# Patient Record
Sex: Male | Born: 1990 | Race: Black or African American | Hispanic: No | Marital: Single | Smoking: Never smoker
Health system: Southern US, Community
[De-identification: ages and names within clinical notes are randomized; demographics above are authoritative.]

## PROBLEM LIST (undated history)

## (undated) DIAGNOSIS — R109 Unspecified abdominal pain: Secondary | ICD-10-CM

## (undated) DIAGNOSIS — K319 Disease of stomach and duodenum, unspecified: Secondary | ICD-10-CM

## (undated) DIAGNOSIS — K449 Diaphragmatic hernia without obstruction or gangrene: Secondary | ICD-10-CM

## (undated) DIAGNOSIS — G8929 Other chronic pain: Secondary | ICD-10-CM

## (undated) DIAGNOSIS — S83249A Other tear of medial meniscus, current injury, unspecified knee, initial encounter: Secondary | ICD-10-CM

## (undated) DIAGNOSIS — Z86010 Personal history of colonic polyps: Secondary | ICD-10-CM

## (undated) DIAGNOSIS — J45909 Unspecified asthma, uncomplicated: Secondary | ICD-10-CM

## (undated) HISTORY — DX: Other tear of medial meniscus, current injury, unspecified knee, initial encounter: S83.249A

## (undated) HISTORY — DX: Personal history of colonic polyps: Z86.010

---
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
## 2011-01-02 ENCOUNTER — Ambulatory Visit (HOSPITAL_COMMUNITY)
Admission: RE | Admit: 2011-01-02 | Discharge: 2011-01-02 | Disposition: A | Discharge status: Home / Self Care | Payer: PRIVATE HEALTH INSURANCE | Source: Ambulatory Visit | Attending: Family Medicine | Admitting: Family Medicine

## 2011-01-05 ENCOUNTER — Ambulatory Visit (HOSPITAL_COMMUNITY)
Admission: RE | Admit: 2011-01-05 | Discharge: 2011-01-05 | Disposition: A | Discharge status: Home / Self Care | Payer: PRIVATE HEALTH INSURANCE | Source: Ambulatory Visit | Attending: Family Medicine | Admitting: Family Medicine

## 2011-01-07 ENCOUNTER — Ambulatory Visit (HOSPITAL_COMMUNITY)
Admission: RE | Admit: 2011-01-07 | Discharge: 2011-01-07 | Disposition: A | Discharge status: Home / Self Care | Payer: PRIVATE HEALTH INSURANCE | Source: Ambulatory Visit | Attending: Family Medicine | Admitting: Family Medicine

## 2011-01-09 ENCOUNTER — Ambulatory Visit (HOSPITAL_COMMUNITY)
Admission: RE | Admit: 2011-01-09 | Discharge: 2011-01-09 | Disposition: A | Discharge status: Home / Self Care | Payer: PRIVATE HEALTH INSURANCE | Source: Ambulatory Visit | Attending: Family Medicine | Admitting: Family Medicine

## 2011-02-19 ENCOUNTER — Ambulatory Visit (INDEPENDENT_AMBULATORY_CARE_PROVIDER_SITE_OTHER): Payer: PRIVATE HEALTH INSURANCE | Admitting: Orthopedic Surgery

## 2011-02-19 DIAGNOSIS — M23302 Other meniscus derangements, unspecified lateral meniscus, unspecified knee: Secondary | ICD-10-CM

## 2011-02-19 DIAGNOSIS — S83509A Sprain of unspecified cruciate ligament of unspecified knee, initial encounter: Secondary | ICD-10-CM

## 2011-02-19 DIAGNOSIS — IMO0002 Reserved for concepts with insufficient information to code with codable children: Secondary | ICD-10-CM

## 2011-02-19 NOTE — Progress Notes (Signed)
   X-ray report.  AP and lateral LEFT knee.  ACL tunnel sites are noted with bone grafting in proper position.  Impression normal appearance. ACL reconstruction

## 2011-02-19 NOTE — Progress Notes (Signed)
   Followup. status post ACL reconstruction with patellar tendon graft, and partial lateral meniscectomy, repair, medial meniscus.   Therapy has been completed.  Patient having no pain or swelling.  Currently, working out at [employer].  The knee is stable. The x-rays show good position of the graft. He has full range of motion. Continue exercises followup in 3 months

## 2011-05-26 ENCOUNTER — Encounter: Payer: Self-pay | Admitting: Orthopedic Surgery

## 2011-05-26 ENCOUNTER — Ambulatory Visit (INDEPENDENT_AMBULATORY_CARE_PROVIDER_SITE_OTHER): Payer: PRIVATE HEALTH INSURANCE | Admitting: Orthopedic Surgery

## 2011-05-26 DIAGNOSIS — S93401A Sprain of unspecified ligament of right ankle, initial encounter: Secondary | ICD-10-CM | POA: Insufficient documentation

## 2011-05-26 DIAGNOSIS — S83509A Sprain of unspecified cruciate ligament of unspecified knee, initial encounter: Secondary | ICD-10-CM

## 2011-05-26 DIAGNOSIS — IMO0002 Reserved for concepts with insufficient information to code with codable children: Secondary | ICD-10-CM

## 2011-05-26 MED ORDER — IBUPROFEN 800 MG PO TABS: 800.0000 mg | ORAL_TABLET | Freq: Three times a day (TID) | ORAL | Status: DC | PRN

## 2011-05-26 NOTE — Patient Instructions (Signed)
Activities as tolerated. 

## 2011-05-26 NOTE — Progress Notes (Signed)
   Followup visit  6 months after ACL reconstruction LEFT knee  Doing well playing sports no problems other than some aching in his training  Stability tests are all normal  Patient discharged followup as needed activities as tolerated

## 2011-08-29 ENCOUNTER — Encounter (HOSPITAL_COMMUNITY): Payer: Self-pay

## 2011-08-29 ENCOUNTER — Emergency Department (HOSPITAL_COMMUNITY)
Admission: EM | Admit: 2011-08-29 | Discharge: 2011-08-29 | Disposition: A | Discharge status: Home / Self Care | Payer: BC Managed Care – PPO | Attending: Emergency Medicine | Admitting: Emergency Medicine

## 2011-08-29 ENCOUNTER — Emergency Department (HOSPITAL_COMMUNITY): Payer: BC Managed Care – PPO

## 2011-08-29 DIAGNOSIS — M25469 Effusion, unspecified knee: Secondary | ICD-10-CM | POA: Insufficient documentation

## 2011-08-29 DIAGNOSIS — M25462 Effusion, left knee: Secondary | ICD-10-CM

## 2011-08-29 DIAGNOSIS — M25569 Pain in unspecified knee: Secondary | ICD-10-CM | POA: Insufficient documentation

## 2011-08-29 MED ORDER — HYDROCODONE-ACETAMINOPHEN 5-325 MG PO TABS: 1.0000 | ORAL_TABLET | Freq: Four times a day (QID) | ORAL | Status: AC | PRN

## 2011-08-29 MED ORDER — HYDROCODONE-ACETAMINOPHEN 5-325 MG PO TABS
1.0000 | ORAL_TABLET | Freq: Once | ORAL | Status: AC
  Administered 2011-08-29: 1 via ORAL
  Filled 2011-08-29: qty 1

## 2011-08-29 NOTE — ED Provider Notes (Signed)
History     CSN: [id number]  Arrival date & time 08/29/11  2027   First MD Initiated Contact with Patient 08/29/11 2038      Chief Complaint  Patient presents with  . Joint Swelling  . Knee Pain    (Consider location/radiation/quality/duration/timing/severity/associated sxs/prior treatment) HPI Comments: Playing basketball 2 days ago and "came down on it wrong" referring to his L knee.  He had a L ACL and MCL repair 11/2010 by dr. Romeo Apple.  Patient is a 21 y.o. male presenting with knee pain. The history is provided by the patient. No language interpreter was used.  Knee Pain This is a new problem. Episode onset: 2 days ago. The problem occurs constantly. The problem has been unchanged. Associated symptoms include joint swelling. The symptoms are aggravated by bending, twisting, walking and standing. He has tried NSAIDs for the symptoms. The treatment provided no relief.    History reviewed. No pertinent past medical history.  Past Surgical History  Procedure Date  . Knee surgery     No family history on file.  History  Substance Use Topics  . Smoking status: Never Smoker   . Smokeless tobacco: Not on file  . Alcohol Use: No      Review of Systems  Musculoskeletal: Positive for joint swelling.       Knee injury   All other systems reviewed and are negative.    Allergies  Review of patient's allergies indicates no known allergies.  Home Medications   Current Outpatient Rx  Name Route Sig Dispense Refill  . ALLERGY MED PO Oral Take by mouth.      Marland Kitchen HYDROCODONE-ACETAMINOPHEN 5-325 MG PO TABS Oral Take 1 tablet by mouth every 4 (four) hours as needed.      Marland Kitchen HYDROCODONE-ACETAMINOPHEN 7.5-325 MG PO TABS Oral Take 1 tablet by mouth every 6 (six) hours as needed.      . IBUPROFEN 800 MG PO TABS Oral Take 1 tablet (800 mg total) by mouth every 8 (eight) hours as needed. 90 tablet 1    BP 151/80  Pulse 67  Temp(Src) 98.1 F (36.7 C) (Oral)  Resp 20  Ht 5\' 7"   (1.702 m)  Wt 184 lb (83.462 kg)  BMI 28.82 kg/m2  SpO2 99%  Physical Exam  Nursing note and vitals reviewed. Constitutional: He is oriented to person, place, and time. He appears well-developed and well-nourished.  HENT:  Head: Normocephalic and atraumatic.  Eyes: EOM are normal.  Neck: Normal range of motion.  Cardiovascular: Normal rate, regular rhythm, normal heart sounds and intact distal pulses.   Pulmonary/Chest: Effort normal and breath sounds normal. No respiratory distress.  Abdominal: Soft. He exhibits no distension. There is no tenderness.  Musculoskeletal: He exhibits tenderness.       Left knee: He exhibits decreased range of motion, swelling and effusion. He exhibits no ecchymosis, no deformity, no laceration, no erythema, no LCL laxity and no MCL laxity. tenderness found. Medial joint line and MCL tenderness noted. No lateral joint line, no LCL and no patellar tendon tenderness noted.       Legs: Neurological: He is alert and oriented to person, place, and time.  Skin: Skin is warm and dry.  Psychiatric: He has a normal mood and affect. Judgment normal.    ED Course  Procedures (including critical care time)  Labs Reviewed - No data to display No results found.   No diagnosis found.    MDM  pl  Worthy Rancher, [place] 08/29/11 2144

## 2011-08-29 NOTE — ED Notes (Signed)
Pt presents with left knee swelling and pain since Thursday. Pt states he was playing basketball and pain and swelling started.

## 2011-08-29 NOTE — ED Notes (Signed)
Pt thinks he came down on left knee wrong while playing basketball on Thursday, pt had ACE bandage in place PTA, hx left knee surgery due to tear ACL and MCL

## 2011-08-30 NOTE — ED Provider Notes (Signed)
Medical screening examination/treatment/procedure(s) were performed by non-physician practitioner and as supervising physician I was immediately available for consultation/collaboration.  Nicoletta Dress. Colon Branch, MD 08/30/11 1310

## 2011-09-02 ENCOUNTER — Encounter: Payer: Self-pay | Admitting: Orthopedic Surgery

## 2011-09-02 ENCOUNTER — Ambulatory Visit (INDEPENDENT_AMBULATORY_CARE_PROVIDER_SITE_OTHER): Payer: BC Managed Care – PPO | Admitting: Orthopedic Surgery

## 2011-09-02 VITALS — BP 120/70 | Ht 67.0 in | Wt 184.0 lb

## 2011-09-02 DIAGNOSIS — S83209A Unspecified tear of unspecified meniscus, current injury, unspecified knee, initial encounter: Secondary | ICD-10-CM | POA: Insufficient documentation

## 2011-09-02 DIAGNOSIS — IMO0002 Reserved for concepts with insufficient information to code with codable children: Secondary | ICD-10-CM

## 2011-09-02 MED ORDER — HYDROCODONE-ACETAMINOPHEN 5-500 MG PO TABS: 1.0000 | ORAL_TABLET | Freq: Four times a day (QID) | ORAL | Status: DC | PRN

## 2011-09-02 MED ORDER — IBUPROFEN 800 MG PO TABS: 800.0000 mg | ORAL_TABLET | Freq: Three times a day (TID) | ORAL | Status: AC | PRN

## 2011-09-02 MED ORDER — IBUPROFEN 800 MG PO TABS: 800.0000 mg | ORAL_TABLET | Freq: Three times a day (TID) | ORAL | Status: DC | PRN

## 2011-09-02 NOTE — Progress Notes (Signed)
Patient ID: Frank Hansen, male   DOB: [birth year], 21 y.o.   MRN: [id number] Chief Complaint  Patient presents with  . Knee Injury    left knee pain, DOI 08/29/11   Previous ACL reconstruction from a basketball injury less than 2 years ago presents back with reinjury of the LEFT knee first of January of this year.  The patient was coming down after jumping for ball playing basketball and the knee felt funny when he landed on it.  The pain was sudden in onset.  Complains of 6/10 constant throbbing pain and pain when he twists the knee.  He did have swelling almost immediately.  There was no pop.  Review of systems a complete review of systems was normal except for seasonal ALLERGIES  His medical history has been reviewed as above  Physical Exam(12) GENERAL: normal development   CDV: pulses are normal   Skin: normal  Lymph: nodes were not palpable/normal  Psychiatric: awake, alert and oriented  Neuro: normal sensation  MSK He is ambulatory without assistive device 1 The RIGHT knee range of motion is 90 2 The medial joint line is tender, there is slight joint effusion 3 There is a positive McMurray sign 4 The graft feels intact, the PCL is normal the Lachman test is normal 5 .  The collateral ligaments feel stable. 6 Muscle tone is normal.  Assessment: X-rays are negative graft tunnels are noted bone blocks are intact    Plan: Medial meniscal tear, MRI.  No sports, come back 2 weeks

## 2011-09-02 NOTE — Patient Instructions (Signed)
MRI OF KNEE

## 2011-09-07 ENCOUNTER — Telehealth: Payer: Self-pay | Admitting: Radiology

## 2011-09-07 NOTE — Telephone Encounter (Signed)
I called to give the patient his MRI appointment at [facility] on 09-09-11 at 5:45. Patient has [employer], authorization [id number] and it expires on 11-04-11. Patient will follow up back in our office for his results.

## 2011-09-09 ENCOUNTER — Ambulatory Visit (HOSPITAL_COMMUNITY)
Admission: RE | Admit: 2011-09-09 | Discharge: 2011-09-09 | Disposition: A | Discharge status: Home / Self Care | Payer: BC Managed Care – PPO | Source: Ambulatory Visit | Attending: Orthopedic Surgery | Admitting: Orthopedic Surgery

## 2011-09-09 DIAGNOSIS — IMO0002 Reserved for concepts with insufficient information to code with codable children: Secondary | ICD-10-CM | POA: Insufficient documentation

## 2011-09-09 DIAGNOSIS — M23359 Other meniscus derangements, posterior horn of lateral meniscus, unspecified knee: Secondary | ICD-10-CM | POA: Insufficient documentation

## 2011-09-09 DIAGNOSIS — S83209A Unspecified tear of unspecified meniscus, current injury, unspecified knee, initial encounter: Secondary | ICD-10-CM

## 2011-09-09 DIAGNOSIS — Y9367 Activity, basketball: Secondary | ICD-10-CM | POA: Insufficient documentation

## 2011-09-09 DIAGNOSIS — W010XXA Fall on same level from slipping, tripping and stumbling without subsequent striking against object, initial encounter: Secondary | ICD-10-CM | POA: Insufficient documentation

## 2011-09-09 DIAGNOSIS — M25569 Pain in unspecified knee: Secondary | ICD-10-CM | POA: Insufficient documentation

## 2011-09-10 ENCOUNTER — Ambulatory Visit: Payer: PRIVATE HEALTH INSURANCE | Admitting: Orthopedic Surgery

## 2011-09-17 ENCOUNTER — Encounter: Payer: Self-pay | Admitting: Orthopedic Surgery

## 2011-09-17 ENCOUNTER — Ambulatory Visit (INDEPENDENT_AMBULATORY_CARE_PROVIDER_SITE_OTHER): Payer: BC Managed Care – PPO | Admitting: Orthopedic Surgery

## 2011-09-17 VITALS — BP 112/70 | Ht 67.0 in | Wt 184.0 lb

## 2011-09-17 DIAGNOSIS — IMO0002 Reserved for concepts with insufficient information to code with codable children: Secondary | ICD-10-CM

## 2011-09-17 MED ORDER — HYDROCODONE-ACETAMINOPHEN 5-325 MG PO TABS: 1.0000 | ORAL_TABLET | ORAL | Status: DC | PRN

## 2011-09-17 NOTE — Progress Notes (Signed)
Patient ID: Frank Hansen, male   DOB: 1990/09/02, 21 y.o.   MRN: [id number]  Imaging follow-up  Knee injury, previous ACL graft, and medial meniscal repair.    The x-ray report, and the film has been reviewed. My interpretation, of the images: R. that there is a new medial meniscal tear and that the ACL graft is intact.   IMPRESSION:  1. Medial tibial bone contusions/marrow edema.  2. New radial tear and inferior articular surface tear involving  the posterior horn of the medial meniscus.  3. Chronic posterior horn lateral meniscus tear.  4. The ACL graft is abnormal. This may be due to mucoid  degeneration or vascularization. Intact fibers are demonstrated.  5. No joint effusion or Baker's cyst.  6. 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  The patient has had a GI bleed, and was taken off his ibuprofen. He is scheduled to have colonoscopy on 14 February so his surgery. Regarding his knee will have to be postponed.  He can remove his brace. He is told not to play any sports and I will get back to him on or around the 14th after the surgery.

## 2011-09-17 NOTE — Progress Notes (Signed)
Addended by: Vickki Hearing on: 09/17/2011 04:21 PM   Modules accepted: Orders

## 2011-09-17 NOTE — Patient Instructions (Signed)
DR WILL CALL

## 2011-09-25 DIAGNOSIS — Z86010 Personal history of colonic polyps: Secondary | ICD-10-CM

## 2011-09-25 DIAGNOSIS — Z860101 Personal history of adenomatous and serrated colon polyps: Secondary | ICD-10-CM

## 2011-09-25 HISTORY — PX: COLONOSCOPY: SHX5424

## 2011-09-25 HISTORY — DX: Personal history of adenomatous and serrated colon polyps: Z86.0101

## 2011-09-25 HISTORY — DX: Personal history of colonic polyps: Z86.010

## 2011-09-25 HISTORY — PX: ESOPHAGOGASTRODUODENOSCOPY: SHX1529

## 2011-10-08 ENCOUNTER — Ambulatory Visit (INDEPENDENT_AMBULATORY_CARE_PROVIDER_SITE_OTHER): Payer: BC Managed Care – PPO | Admitting: Gastroenterology

## 2011-10-08 ENCOUNTER — Encounter: Payer: Self-pay | Admitting: Gastroenterology

## 2011-10-08 VITALS — BP 133/78 | HR 68 | Temp 98.3°F | Ht 67.0 in | Wt 170.4 lb

## 2011-10-08 DIAGNOSIS — R109 Unspecified abdominal pain: Secondary | ICD-10-CM

## 2011-10-08 DIAGNOSIS — R1013 Epigastric pain: Secondary | ICD-10-CM | POA: Insufficient documentation

## 2011-10-08 DIAGNOSIS — K625 Hemorrhage of anus and rectum: Secondary | ICD-10-CM | POA: Insufficient documentation

## 2011-10-08 MED ORDER — PEG-KCL-NACL-NASULF-NA ASC-C 100 G PO SOLR: 1.0000 | Freq: Once | ORAL | Status: DC

## 2011-10-08 MED ORDER — OMEPRAZOLE 20 MG PO CPDR: 20.0000 mg | DELAYED_RELEASE_CAPSULE | Freq: Two times a day (BID) | ORAL | Status: DC

## 2011-10-08 NOTE — Progress Notes (Signed)
Referring Provider: Laural Golden* Primary Care Physician:  Harlow Asa, MD, MD Primary Gastroenterologist:  Dr. Jena Gauss   Chief Complaint  Patient presents with  . Abdominal Pain    HPI:   Frank Hansen presents today for a consultation secondary to rectal bleeding and possible melena, as well as abdominal pain. He has a history of left knee surgery in the past, and he recently suffered another medial meniscus tear that will require surgery in the near future. He has been seeing Dr. Romeo Apple. Reports chronic Ibuprofen use for about a year, stopped taking Jan 15th at doctor's request due to symptoms. Onset of low-volume hematochezia began after Thanksgiving. Reports normal BMs, no diarrhea or constipation. Does note intermittent tarry/sticky black stools. + epigastric pain, intermittent, not associated with eating/drinking. Intermittent nausea. Lower abdominal discomfort as well, worsened after eating at times. Described as a sharp pain.   No loss of appetite or wt loss. Currently taking Prilosec once daily.   Past Medical History  Diagnosis Date  . Medial meniscus tear     needs surgery, Dr. Romeo Apple to perform in near future.     Past Surgical History  Procedure Date  . Acl graft and medial meniscus repair 12/12/10    left, needs repeat surgery for medial meniscus tear in near future, graft intact    Current Outpatient Prescriptions  Medication Sig Dispense Refill  . HYDROcodone-acetaminophen (VICODIN) 5-500 MG per tablet Take 1 tablet by mouth every 6 (six) hours as needed for pain.  60 tablet  0  . omeprazole (PRILOSEC) 20 MG capsule Take 1 capsule (20 mg total) by mouth 2 (two) times daily.  60 capsule  3  . peg 3350 powder (MOVIPREP) 100 G SOLR Take 1 kit (100 g total) by mouth once. As directed Please purchase 1 Fleets enema to use with the prep  1 kit  0    Allergies as of 10/08/2011  . (No Known Allergies)    Family History  Problem Relation Age of Onset  .  Diabetes    . Colon cancer Neg Hx     History   Social History  . Marital Status: Single    Spouse Name: N/A    Number of Children: N/A  . Years of Education: N/A   Occupational History  . student     [employer], Personnel officer    Social History Main Topics  . Smoking status: Never Smoker   . Smokeless tobacco: Not on file  . Alcohol Use: No  . Drug Use: No  . Sexually Active: Not on file   Other Topics Concern  . Not on file   Social History Narrative  . No narrative on file    Review of Systems: Gen: Denies any fever, chills, loss of appetite, fatigue, weight loss. CV: Denies chest pain, heart palpitations, syncope, peripheral edema. Resp: Denies shortness of breath with rest, cough, wheezing GI: Denies dysphagia or odynophagia. Denies hematemesis, fecal incontinence, or jaundice.  GU : Denies urinary burning, urinary frequency, urinary incontinence.  MS: Denies joint pain, muscle weakness, cramps, limited movement Derm: Denies rash, itching, dry skin Psych: Denies depression, anxiety, confusion or memory loss  Heme: Denies bruising, bleeding, and enlarged lymph nodes.  Physical Exam: BP 133/78  Pulse 68  Temp(Src) 98.3 F (36.8 C) (Temporal)  Ht 5\' 7"  (1.702 m)  Wt 170 lb 6.4 oz (77.293 kg)  BMI 26.69 kg/m2 General:   Alert and oriented. Well-developed, well-nourished, pleasant and cooperative. Head:  Normocephalic and  atraumatic. Eyes:  Conjunctiva pink, sclera clear, no icterus.   Conjunctiva pink. Ears:  Normal auditory acuity. Nose:  No deformity, discharge,  or lesions. Mouth:  No deformity or lesions, mucosa pink and moist.  Neck:  Supple, without mass or thyromegaly. Lungs:  Clear to auscultation bilaterally, without wheezing, rales, or rhonchi.  Heart:  S1, S2 present without murmurs noted.  Abdomen:  +BS, soft, mildly TTP epigastric region and non-distended. Without mass or HSM. No rebound or guarding. No hernias noted. Rectal:   Deferred  Msk:  Symmetrical without gross deformities. Normal posture. Pulses:  Normal pulses noted. Extremities:  Without clubbing or edema. Neurologic:  Alert and  oriented x4;  grossly normal neurologically. Skin:  Intact, warm and dry without significant lesions or rashes Cervical Nodes:  No significant cervical adenopathy. Psych:  Alert and cooperative. Normal mood and affect.

## 2011-10-08 NOTE — Patient Instructions (Signed)
We have set you up for a colonoscopy and upper endoscopy with Dr. Jena Gauss in the near future.   Please have the blood work completed and we will call you with the results.  Increase Prilosec to twice a day, 30 min before breakfast and 30 min before dinner.

## 2011-10-08 NOTE — Assessment & Plan Note (Signed)
21 year old male with intermittent hematochezia since Thanksgiving, in the setting of chronic Ibuprofen use multiple times per day. Needs knee surgery in upcoming future due to medial meniscus tear. No diarrhea, constipation noted. Does report lower abdominal discomfort, unrelated to BMs, sometimes worsened after eating. Question NSAID-induced injury, low likelihood IBD but unable to rule out. Will proceed with TCS in near future.  Proceed with TCS with Dr. Jena Gauss in near future: the risks, benefits, and alternatives have been discussed with the patient in detail. The patient states understanding and desires to proceed. Continue to avoid NSAIDs

## 2011-10-08 NOTE — Assessment & Plan Note (Signed)
Epigastric pain since Thanksgiving in the setting of chronic NSAIDs, reports of intermittent black/tarry stool. Intermittent nausea. On Prilosec daily. Likely NSAID-induced injury. Will proceed with EGD at time of TCS. Obtain CBC.   Increase Prilosec to BID CBC Proceed with upper endoscopy in the near future with Dr. Jena Gauss. The risks, benefits, and alternatives have been discussed in detail with patient. They have stated understanding and desire to proceed.

## 2011-10-09 LAB — CBC WITH DIFFERENTIAL/PLATELET
Hemoglobin: 14.7 g/dL (ref 13.0–17.0)
Lymphs Abs: 2.3 10*3/uL (ref 0.7–4.0)
MCH: 31 pg (ref 26.0–34.0)
Monocytes Relative: 10 % (ref 3–12)
Neutro Abs: 2.8 10*3/uL (ref 1.7–7.7)
Neutrophils Relative %: 43 % (ref 43–77)
RBC: 4.74 MIL/uL (ref 4.22–5.81)

## 2011-10-12 NOTE — Progress Notes (Signed)
Quick Note:  No anemia. Continue with EGD/TCS as planned. ______

## 2011-10-12 NOTE — Progress Notes (Signed)
Faxed to PCP

## 2011-10-13 NOTE — Progress Notes (Signed)
Quick Note:  Tried to call pt- NA ______ 

## 2011-10-14 NOTE — Progress Notes (Signed)
Quick Note:  Tried to call pt NA- letter mailed to pt ______

## 2011-10-15 ENCOUNTER — Emergency Department (HOSPITAL_COMMUNITY)
Admission: EM | Admit: 2011-10-15 | Discharge: 2011-10-15 | Disposition: A | Discharge status: Home / Self Care | Payer: BC Managed Care – PPO | Attending: Emergency Medicine | Admitting: Emergency Medicine

## 2011-10-15 ENCOUNTER — Encounter (HOSPITAL_COMMUNITY): Payer: Self-pay | Admitting: *Deleted

## 2011-10-15 ENCOUNTER — Emergency Department (HOSPITAL_COMMUNITY): Payer: BC Managed Care – PPO

## 2011-10-15 DIAGNOSIS — Z79899 Other long term (current) drug therapy: Secondary | ICD-10-CM | POA: Insufficient documentation

## 2011-10-15 DIAGNOSIS — R110 Nausea: Secondary | ICD-10-CM | POA: Insufficient documentation

## 2011-10-15 DIAGNOSIS — R1013 Epigastric pain: Secondary | ICD-10-CM | POA: Insufficient documentation

## 2011-10-15 DIAGNOSIS — R630 Anorexia: Secondary | ICD-10-CM | POA: Insufficient documentation

## 2011-10-15 DIAGNOSIS — R10816 Epigastric abdominal tenderness: Secondary | ICD-10-CM | POA: Insufficient documentation

## 2011-10-15 HISTORY — DX: Disease of stomach and duodenum, unspecified: K31.9

## 2011-10-15 LAB — CBC
HCT: 42.1 % (ref 39.0–52.0)
Hemoglobin: 14.7 g/dL (ref 13.0–17.0)
MCV: 90.5 fL (ref 78.0–100.0)
Platelets: 272 10*3/uL (ref 150–400)
RBC: 4.65 MIL/uL (ref 4.22–5.81)
WBC: 9.1 10*3/uL (ref 4.0–10.5)

## 2011-10-15 LAB — DIFFERENTIAL
Eosinophils Relative: 2 % (ref 0–5)
Lymphocytes Relative: 24 % (ref 12–46)
Lymphs Abs: 2.2 10*3/uL (ref 0.7–4.0)
Monocytes Absolute: 0.6 10*3/uL (ref 0.1–1.0)
Monocytes Relative: 7 % (ref 3–12)

## 2011-10-15 LAB — COMPREHENSIVE METABOLIC PANEL
ALT: 16 U/L (ref 0–53)
BUN: 18 mg/dL (ref 6–23)
CO2: 28 mEq/L (ref 19–32)
Calcium: 10.3 mg/dL (ref 8.4–10.5)
GFR calc Af Amer: 90 mL/min — ABNORMAL LOW (ref >90)
GFR calc non Af Amer: 77 mL/min — ABNORMAL LOW (ref >90)
Glucose, Bld: 98 mg/dL (ref 70–99)
Sodium: 138 mEq/L (ref 135–145)

## 2011-10-15 MED ORDER — GI COCKTAIL ~~LOC~~
30.0000 mL | Freq: Once | ORAL | Status: AC
  Administered 2011-10-15: 30 mL via ORAL
  Filled 2011-10-15: qty 30

## 2011-10-15 MED ORDER — GI COCKTAIL ~~LOC~~: 30.0000 mL | Freq: Once | ORAL | Status: DC

## 2011-10-15 NOTE — ED Provider Notes (Signed)
Pt improved with tx and will get upper gi scope next week.  Frank Lennert, MD 10/15/11 2325

## 2011-10-15 NOTE — ED Notes (Signed)
abd pain since November, Thinks is due to Motrin he took, To have endo and colonoscopy on Monday.

## 2011-10-15 NOTE — Discharge Instructions (Signed)
Follow up for the upper gi as scheduled

## 2011-10-15 NOTE — ED Provider Notes (Cosign Needed)
History     CSN: [id number]  Arrival date & time 10/15/11  2039   First MD Initiated Contact with Patient 10/15/11 2138      Chief Complaint  Patient presents with  . Abdominal Pain    (Consider location/radiation/quality/duration/timing/severity/associated sxs/prior treatment) Patient is a 21 y.o. male presenting with abdominal pain. The history is provided by the patient and medical records. No language interpreter was used.  Abdominal Pain The primary symptoms of the illness include abdominal pain and nausea. The primary symptoms of the illness do not include fever. Primary symptoms comment: Patient complains of abdominal pain in the epigastric region. He's had this pain on and off since November 2012. Today it seems a bit worse, and he therefore sought evaluation. He has had gastroenterology consultation. Episode onset: He has been prescribed omeprazole for hyperacidity and hydrocodone-acetaminophen for pain.  He is scheduled for upper endoscopy in 4 days. The onset of the illness was gradual. The problem has been gradually worsening.  The illness is associated with NSAID use. The patient has not had a change in bowel habit. Additional symptoms associated with the illness include anorexia. Symptoms associated with the illness do not include chills. Associated medical issues comments: Concern for peptic ulcer disease.Marland Kitchen    Past Medical History  Diagnosis Date  . Medial meniscus tear     needs surgery, Dr. Romeo Apple to perform in near future.   . Stomach problems     Past Surgical History  Procedure Date  . Acl graft and medial meniscus repair 12/12/10    left, needs repeat surgery for medial meniscus tear in near future, graft intact    Family History  Problem Relation Age of Onset  . Diabetes    . Colon cancer Neg Hx     History  Substance Use Topics  . Smoking status: Never Smoker   . Smokeless tobacco: Not on file  . Alcohol Use: No      Review of Systems    Constitutional: Negative.  Negative for fever and chills.  HENT: Negative.   Eyes: Negative.   Respiratory: Negative.   Cardiovascular: Negative.   Gastrointestinal: Positive for nausea, abdominal pain and anorexia.  Genitourinary: Negative.   Musculoskeletal:       Has known L ACL tear.  Is waiting to have this repaired once his GI condition is better.  Skin: Negative.   Neurological: Negative.   Psychiatric/Behavioral: Negative.     Allergies  Review of patient's allergies indicates no known allergies.  Home Medications   Current Outpatient Rx  Name Route Sig Dispense Refill  . HYDROCODONE-ACETAMINOPHEN 5-500 MG PO TABS Oral Take 1 tablet by mouth every 6 (six) hours as needed for pain. 60 tablet 0  . OMEPRAZOLE 20 MG PO CPDR Oral Take 1 capsule (20 mg total) by mouth 2 (two) times daily. 60 capsule 3    BP 121/69  Pulse 93  Temp(Src) 99.3 F (37.4 C) (Oral)  Resp 20  Ht 5\' 7"  (1.702 m)  Wt 170 lb (77.111 kg)  BMI 26.63 kg/m2  SpO2 99%  Physical Exam  Nursing note and vitals reviewed. Constitutional: He appears well-developed and well-nourished. No distress.  HENT:  Head: Normocephalic and atraumatic.  Right Ear: External ear normal.  Left Ear: External ear normal.  Mouth/Throat: Oropharynx is clear and moist.  Eyes: Conjunctivae and EOM are normal. Pupils are equal, round, and reactive to light. No scleral icterus.  Neck: Normal range of motion. Neck supple.  Cardiovascular: Normal  rate, regular rhythm and normal heart sounds.   Pulmonary/Chest: Effort normal and breath sounds normal.  Abdominal: Soft. There is Tenderness: he has mild epigastric tenderness without rebound or rigidity..  Musculoskeletal: Normal range of motion.       He has a well-healed scar on the left knee from a prior ACL repair.   Skin: Skin is warm and dry.  Psychiatric: He has a normal mood and affect. His behavior is normal.    ED Course  Procedures (including critical care  time)   Labs Reviewed  CBC  DIFFERENTIAL  COMPREHENSIVE METABOLIC PANEL  LIPASE, BLOOD  URINALYSIS, ROUTINE W REFLEX MICROSCOPIC   9:38 PM Pt seen --> physical exam performed.  Lab workup ordered.  GI cocktail po ordered.  Old charts reviewed, shows pt has had symptoms of gastritis since November 2012, felt to be due to high doses of NSAIDs.  He is scheduled for upper endoscopy in 4 days.  He is on omeprazole for his gastritis and hydrocodone-acetaminophen as needed for pain.   Results for orders placed during the hospital encounter of 10/15/11  CBC      Component Value Range   WBC 9.1  4.0 - 10.5 (K/uL)   RBC 4.65  4.22 - 5.81 (MIL/uL)   Hemoglobin 14.7  13.0 - 17.0 (g/dL)   HCT 16.1  09.6 - 04.5 (%)   MCV 90.5  78.0 - 100.0 (fL)   MCH 31.6  26.0 - 34.0 (pg)   MCHC 34.9  30.0 - 36.0 (g/dL)   RDW 40.9  81.1 - 91.4 (%)   Platelets 272  150 - 400 (K/uL)  DIFFERENTIAL      Component Value Range   Neutrophils Relative 67  43 - 77 (%)   Neutro Abs 6.2  1.7 - 7.7 (K/uL)   Lymphocytes Relative 24  12 - 46 (%)   Lymphs Abs 2.2  0.7 - 4.0 (K/uL)   Monocytes Relative 7  3 - 12 (%)   Monocytes Absolute 0.6  0.1 - 1.0 (K/uL)   Eosinophils Relative 2  0 - 5 (%)   Eosinophils Absolute 0.1  0.0 - 0.7 (K/uL)   Basophils Relative 0  0 - 1 (%)   Basophils Absolute 0.0  0.0 - 0.1 (K/uL)  COMPREHENSIVE METABOLIC PANEL      Component Value Range   Sodium 138  135 - 145 (mEq/L)   Potassium 4.3  3.5 - 5.1 (mEq/L)   Chloride 100  96 - 112 (mEq/L)   CO2 28  19 - 32 (mEq/L)   Glucose, Bld 98  70 - 99 (mg/dL)   BUN 18  6 - 23 (mg/dL)   Creatinine, Ser 7.82  0.50 - 1.35 (mg/dL)   Calcium 95.6  8.4 - 10.5 (mg/dL)   Total Protein 8.6 (*) 6.0 - 8.3 (g/dL)   Albumin 4.7  3.5 - 5.2 (g/dL)   AST 22  0 - 37 (U/L)   ALT 16  0 - 53 (U/L)   Alkaline Phosphatase 98  39 - 117 (U/L)   Total Bilirubin 0.4  0.3 - 1.2 (mg/dL)   GFR calc non Af Amer 77 (*) >90 (mL/min)   GFR calc Af Amer 90 (*) >90 (mL/min)   LIPASE, BLOOD      Component Value Range   Lipase 47  11 - 59 (U/L)   Dg Abd Acute W/chest  10/15/2011  *RADIOLOGY REPORT*  Clinical Data: Worsening abdominal pain; history of gastritis due to NSAIDs.  ACUTE  ABDOMEN SERIES (ABDOMEN 2 VIEW & CHEST 1 VIEW)  Comparison: None.  Findings: The lungs are well-aerated and clear.  There is no evidence of focal opacification, pleural effusion or pneumothorax. The cardiomediastinal silhouette is within normal limits.  The visualized bowel gas pattern is unremarkable. Stool and air are noted throughout the colon; there is no evidence of small bowel dilatation to suggest obstruction.  A trace amount of air is noted within the stomach.  No free intra-abdominal air is identified on the provided upright view.  No acute osseous abnormalities are seen; the sacroiliac joints are unremarkable in appearance.  IMPRESSION:  1.  Unremarkable bowel gas pattern; no free intra-abdominal air seen. 2.  No acute cardiopulmonary process identified.  Original Report Authenticated By: Tonia Ghent, M.D.   Pt's lab workup was negative. He was released with advice to have upper endoscopy as scheduled.   1. Abdominal pain          Carleene Cooper III, MD 10/16/11 [phone]

## 2011-10-19 ENCOUNTER — Encounter (HOSPITAL_COMMUNITY): Payer: Self-pay

## 2011-10-19 ENCOUNTER — Encounter (HOSPITAL_COMMUNITY)
Admission: RE | Disposition: A | Discharge status: Home Health | Payer: Self-pay | Source: Ambulatory Visit | Attending: Internal Medicine

## 2011-10-19 ENCOUNTER — Ambulatory Visit (HOSPITAL_COMMUNITY)
Admission: RE | Admit: 2011-10-19 | Discharge: 2011-10-19 | Disposition: A | Discharge status: Home Health | Payer: BC Managed Care – PPO | Source: Ambulatory Visit | Attending: Internal Medicine | Admitting: Internal Medicine

## 2011-10-19 DIAGNOSIS — D128 Benign neoplasm of rectum: Secondary | ICD-10-CM | POA: Insufficient documentation

## 2011-10-19 DIAGNOSIS — K621 Rectal polyp: Secondary | ICD-10-CM

## 2011-10-19 DIAGNOSIS — R109 Unspecified abdominal pain: Secondary | ICD-10-CM | POA: Insufficient documentation

## 2011-10-19 DIAGNOSIS — K620 Anal polyp: Secondary | ICD-10-CM

## 2011-10-19 DIAGNOSIS — K449 Diaphragmatic hernia without obstruction or gangrene: Secondary | ICD-10-CM | POA: Insufficient documentation

## 2011-10-19 DIAGNOSIS — K921 Melena: Secondary | ICD-10-CM

## 2011-10-19 SURGERY — COLONOSCOPY WITH ESOPHAGOGASTRODUODENOSCOPY (EGD)
Anesthesia: Moderate Sedation

## 2011-10-19 MED ORDER — SODIUM CHLORIDE 0.45 % IV SOLN
INTRAVENOUS | Status: DC
  Administered 2011-10-19: 13:00:00 via INTRAVENOUS

## 2011-10-19 MED ORDER — MEPERIDINE HCL 100 MG/ML IJ SOLN
INTRAMUSCULAR | Status: AC
  Filled 2011-10-19: qty 2

## 2011-10-19 MED ORDER — MEPERIDINE HCL 100 MG/ML IJ SOLN
INTRAMUSCULAR | Status: DC | PRN
  Administered 2011-10-19: 25 mg via INTRAVENOUS
  Administered 2011-10-19: 50 mg via INTRAVENOUS
  Administered 2011-10-19: 25 mg via INTRAVENOUS

## 2011-10-19 MED ORDER — BUTAMBEN-TETRACAINE-BENZOCAINE 2-2-14 % EX AERO
INHALATION_SPRAY | CUTANEOUS | Status: DC | PRN
  Administered 2011-10-19: 2 via TOPICAL

## 2011-10-19 MED ORDER — MIDAZOLAM HCL 5 MG/5ML IJ SOLN
INTRAMUSCULAR | Status: AC
  Filled 2011-10-19: qty 10

## 2011-10-19 MED ORDER — MIDAZOLAM HCL 5 MG/5ML IJ SOLN
INTRAMUSCULAR | Status: DC | PRN
  Administered 2011-10-19: 2 mg via INTRAVENOUS
  Administered 2011-10-19 (×3): 1 mg via INTRAVENOUS
  Administered 2011-10-19: 2 mg via INTRAVENOUS
  Administered 2011-10-19: 1 mg via INTRAVENOUS

## 2011-10-19 MED ORDER — STERILE WATER FOR IRRIGATION IR SOLN
Status: DC | PRN
  Administered 2011-10-19: 13:00:00

## 2011-10-19 NOTE — Discharge Instructions (Signed)
Colonoscopy Discharge Instructions  Read the instructions outlined below and refer to this sheet in the next few weeks. These discharge instructions provide you with general information on caring for yourself after you leave the hospital. Your doctor may also give you specific instructions. While your treatment has been planned according to the most current medical practices available, unavoidable complications occasionally occur. If you have any problems or questions after discharge, call Dr. Gala Romney at [phone]. ACTIVITY  You may resume your regular activity, but move at a slower pace for the next 24 hours.   Take frequent rest periods for the next 24 hours.   Walking will help get rid of the air and reduce the bloated feeling in your belly (abdomen).   No driving for 24 hours (because of the medicine (anesthesia) used during the test).    Do not sign any important legal documents or operate any machinery for 24 hours (because of the anesthesia used during the test).  NUTRITION  Drink plenty of fluids.   You may resume your normal diet as instructed by your doctor.   Begin with a light meal and progress to your normal diet. Heavy or fried foods are harder to digest and may make you feel sick to your stomach (nauseated).   Avoid alcoholic beverages for 24 hours or as instructed.  MEDICATIONS  You may resume your normal medications unless your doctor tells you otherwise.  WHAT YOU CAN EXPECT TODAY  Some feelings of bloating in the abdomen.   Passage of more gas than usual.   Spotting of blood in your stool or on the toilet paper.  IF YOU HAD POLYPS REMOVED DURING THE COLONOSCOPY:  No aspirin products for 7 days or as instructed.   No alcohol for 7 days or as instructed.   Eat a soft diet for the next 24 hours.  FINDING OUT THE RESULTS OF YOUR TEST Not all test results are available during your visit. If your test results are not back during the visit, make an appointment  with your caregiver to find out the results. Do not assume everything is normal if you have not heard from your caregiver or the medical facility. It is important for you to follow up on all of your test results.  SEEK IMMEDIATE MEDICAL ATTENTION IF:  You have more than a spotting of blood in your stool.   Your belly is swollen (abdominal distention).   You are nauseated or vomiting.   You have a temperature over 101.  You have abdominal pain or discomfort that is severe or gets worse throughout the day. EGD Discharge instructions Please read the instructions outlined below and refer to this sheet in the next few weeks. These discharge instructions provide you with general information on caring for yourself after you leave the hospital. Your doctor may also give you specific instructions. While your treatment has been planned according to the most current medical practices available, unavoidable complications occasionally occur. If you have any problems or questions after discharge, please call your doctor. ACTIVITY You may resume your regular activity but move at a slower pace for the next 24 hours.  Take frequent rest periods for the next 24 hours.  Walking will help expel (get rid of) the air and reduce the bloated feeling in your abdomen.  No driving for 24 hours (because of the anesthesia (medicine) used during the test).  You may shower.  Do not sign any important legal documents or operate any machinery for 24  hours (because of the anesthesia used during the test).  NUTRITION Drink plenty of fluids.  You may resume your normal diet.  Begin with a light meal and progress to your normal diet.  Avoid alcoholic beverages for 24 hours or as instructed by your caregiver.  MEDICATIONS You may resume your normal medications unless your caregiver tells you otherwise.  WHAT YOU CAN EXPECT TODAY You may experience abdominal discomfort such as a feeling of fullness or "gas" pains.   FOLLOW-UP Your doctor will discuss the results of your test with you.  SEEK IMMEDIATE MEDICAL ATTENTION IF ANY OF THE FOLLOWING OCCUR: Excessive nausea (feeling sick to your stomach) and/or vomiting.  Severe abdominal pain and distention (swelling).  Trouble swallowing.  Temperature over 101 F (37.8 C).  Rectal bleeding or vomiting of blood.    Stop Prilosec; begin AcipHex 20 mg daily.  Avoid nonsteroidal agents in the future.  Further recommendations to follow pending review of pathology report.

## 2011-10-19 NOTE — H&P (View-Only) (Signed)
Referring Provider: Luking, William Stephen* Primary Care Physician:  LUKING,W S, MD, MD Primary Gastroenterologist:  Dr. Rourk   Chief Complaint  Patient presents with  . Abdominal Pain    HPI:   Frank Hansen presents today for a consultation secondary to rectal bleeding and possible melena, as well as abdominal pain. He has a history of left knee surgery in the past, and he recently suffered another medial meniscus tear that will require surgery in the near future. He has been seeing Dr. Harrison. Reports chronic Ibuprofen use for about a year, stopped taking Jan 15th at doctor's request due to symptoms. Onset of low-volume hematochezia began after Thanksgiving. Reports normal BMs, no diarrhea or constipation. Does note intermittent tarry/sticky black stools. + epigastric pain, intermittent, not associated with eating/drinking. Intermittent nausea. Lower abdominal discomfort as well, worsened after eating at times. Described as a sharp pain.   No loss of appetite or wt loss. Currently taking Prilosec once daily.   Past Medical History  Diagnosis Date  . Medial meniscus tear     needs surgery, Dr. Harrison to perform in near future.     Past Surgical History  Procedure Date  . Acl graft and medial meniscus repair 12/12/10    left, needs repeat surgery for medial meniscus tear in near future, graft intact    Current Outpatient Prescriptions  Medication Sig Dispense Refill  . HYDROcodone-acetaminophen (VICODIN) 5-500 MG per tablet Take 1 tablet by mouth every 6 (six) hours as needed for pain.  60 tablet  0  . omeprazole (PRILOSEC) 20 MG capsule Take 1 capsule (20 mg total) by mouth 2 (two) times daily.  60 capsule  3  . peg 3350 powder (MOVIPREP) 100 G SOLR Take 1 kit (100 g total) by mouth once. As directed Please purchase 1 Fleets enema to use with the prep  1 kit  0    Allergies as of 10/08/2011  . (No Known Allergies)    Family History  Problem Relation Age of Onset  .  Diabetes    . Colon cancer Neg Hx     History   Social History  . Marital Status: Single    Spouse Name: N/A    Number of Children: N/A  . Years of Education: N/A   Occupational History  . student     [employer], Electrician    Social History Main Topics  . Smoking status: Never Smoker   . Smokeless tobacco: Not on file  . Alcohol Use: No  . Drug Use: No  . Sexually Active: Not on file   Other Topics Concern  . Not on file   Social History Narrative  . No narrative on file    Review of Systems: Gen: Denies any fever, chills, loss of appetite, fatigue, weight loss. CV: Denies chest pain, heart palpitations, syncope, peripheral edema. Resp: Denies shortness of breath with rest, cough, wheezing GI: Denies dysphagia or odynophagia. Denies hematemesis, fecal incontinence, or jaundice.  GU : Denies urinary burning, urinary frequency, urinary incontinence.  MS: Denies joint pain, muscle weakness, cramps, limited movement Derm: Denies rash, itching, dry skin Psych: Denies depression, anxiety, confusion or memory loss  Heme: Denies bruising, bleeding, and enlarged lymph nodes.  Physical Exam: BP 133/78  Pulse 68  Temp(Src) 98.3 F (36.8 C) (Temporal)  Ht 5' 7" (1.702 m)  Wt 170 lb 6.4 oz (77.293 kg)  BMI 26.69 kg/m2 General:   Alert and oriented. Well-developed, well-nourished, pleasant and cooperative. Head:  Normocephalic and   atraumatic. Eyes:  Conjunctiva pink, sclera clear, no icterus.   Conjunctiva pink. Ears:  Normal auditory acuity. Nose:  No deformity, discharge,  or lesions. Mouth:  No deformity or lesions, mucosa pink and moist.  Neck:  Supple, without mass or thyromegaly. Lungs:  Clear to auscultation bilaterally, without wheezing, rales, or rhonchi.  Heart:  S1, S2 present without murmurs noted.  Abdomen:  +BS, soft, mildly TTP epigastric region and non-distended. Without mass or HSM. No rebound or guarding. No hernias noted. Rectal:   Deferred  Msk:  Symmetrical without gross deformities. Normal posture. Pulses:  Normal pulses noted. Extremities:  Without clubbing or edema. Neurologic:  Alert and  oriented x4;  grossly normal neurologically. Skin:  Intact, warm and dry without significant lesions or rashes Cervical Nodes:  No significant cervical adenopathy. Psych:  Alert and cooperative. Normal mood and affect.   

## 2011-10-19 NOTE — Op Note (Signed)
[facility] [address] [place], [place]  [address]  ENDOSCOPY PROCEDURE REPORT  PATIENT:  Frank Hansen, Frank Hansen  MR#:  [id number] BIRTHDATE:  1990/10/27, 21 yrs. old  GENDER:  male  ENDOSCOPIST:  R. Roetta Sessions, MD Caleen Essex Referred by:  Simone Curia, M.D.  PROCEDURE DATE:  10/19/2011 PROCEDURE:   Diagnostic EGD  INDICATIONS:   melena and hematochezia; recent NSAID use. However, CBC normal  INFORMED CONSENT:   The risks, benefits, limitations, alternatives and imponderables have been discussed.  The potential for biopsy, esophogeal dilation, etc. have also been reviewed.  Questions have been answered.  All parties agreeable.  Please see the history and physical in the medical record for more information.  MEDICATIONS: Versed 5 mg IV and Demerol 100 mg IV in divided doses. Cetacaine spray  DESCRIPTION OF PROCEDURE:   The [id number] ([id number]) endoscope was introduced through the mouth and advanced to the second portion of the duodenum without difficulty or limitations.  The mucosal surfaces were surveyed very carefully during advancement of the scope and upon withdrawal.  Retroflexion view of the proximal stomach and esophagogastric junction was performed.  <<PROCEDUREIMAGES>>  FINDINGS:  Normal esophagus. Stomach empty. Small hiatal hernia; otherwise, normal gastric mucosa. Patent pylorus. Normal first and second portion of the duodenum. THERAPEUTIC / DIAGNOSTIC MANEUVERS PERFORMED:   None  COMPLICATIONS:   None  IMPRESSION:    Small hiatal hernia otherwise negative exam  RECOMMENDATIONS:   See colonoscopy report.  ______________________________ R. Roetta Sessions, MD Caleen Essex  CC:  n. eSIGNED:   R. Roetta Sessions at 10/19/2011 02:01 PM  Coralie Keens, [phone]

## 2011-10-19 NOTE — Interval H&P Note (Signed)
History and Physical Interval Note:  10/19/2011 1:43 PM  Frank Hansen  has presented today for surgery, with the diagnosis of rectal bleeding, epigastric pain  The various methods of treatment have been discussed with the patient and family. After consideration of risks, benefits and other options for treatment, the patient has consented to  Procedure(s) (LRB): COLONOSCOPY WITH ESOPHAGOGASTRODUODENOSCOPY (EGD) (N/A) as a surgical intervention .  The patients' history has been reviewed, patient examined, no change in status, stable for surgery.  I have reviewed the patients' chart and labs.  Questions were answered to the patient's satisfaction.     Eula Listen

## 2011-10-20 NOTE — Op Note (Signed)
[facility] [address] [place], [place]  [address]  COLONOSCOPY PROCEDURE REPORT  PATIENT:  Frank Hansen, Frank Hansen  MR#:  [id number] BIRTHDATE:  1990/10/20, 21 yrs. old  GENDER:  male ENDOSCOPIST:  R. Roetta Sessions, MD FACP [facility] REF. BY:  Simone Curia, M.D. PROCEDURE DATE:  10/19/2011 PROCEDURE:  Ileocolonoscopy biopsy  INDICATIONS:  Hematochezia; recent NSAID use  INFORMED CONSENT:  The risks, benefits, alternatives and imponderables including but not limited to bleeding, perforation as well as the possibility of a missed lesion have been reviewed. The potential for biopsy, lesion removal, etc. have also been discussed.  Questions have been answered.  All parties agreeable. Please see the history and physical in the medical record for more information.  MEDICATIONS:  Versed 8 mg IV and Demerol 100 mg IV in divided doses.  DESCRIPTION OF PROCEDURE:  After a digital rectal exam was performed, the [id number] ([id number]) and [id number] ([id number]) colonoscope was advanced from the anus through the rectum and colon to the area of the cecum, ileocecal valve and appendiceal orifice.  The cecum was deeply intubated.  These structures were well-seen and photographed for the record.  From the level of the cecum and ileocecal valve, the scope was slowly and cautiously withdrawn.  The mucosal surfaces were carefully surveyed utilizing scope tip deflection to facilitate fold flattening as needed.  The scope was pulled down into the rectum where a thorough examination including retroflexion was performed. <<PROCEDUREIMAGES>>  FINDINGS: Suboptimal preparation. Normal rectum aside from a single hyperplastic-appearing polyp at 8 cm.. The colonic mucosa appeared normal. The distal 10 cm of terminal ileal mucosa appeared normal..  THERAPEUTIC / DIAGNOSTIC MANEUVERS PERFORMED:  The rectal polyp mentioned above was cold biopsy removed  COMPLICATIONS:  None  CECAL WITHDRAWAL TIME:   8  minutes  IMPRESSION:    Single rectal polyp-removed as described above; the colonic and terminal ileum mucosa appeared normal. I suspect a self-limiting NSAID insult; he continues to have some abdominal pain.  RECOMMENDATIONS:  Stop Prilosec; begin AcipHex 20 mg orally daily. Use coupon for free medication provided.  Avoid nonsteroidal agents. Further recommendations to follow pending review of pathology report.  ______________________________ R. Roetta Sessions, MD Caleen Essex  CC:  Simone Curia, M.D.  n. eSIGNED:   R. Roetta Sessions at 10/19/2011 02:21 PM  Coralie Keens, [phone]

## 2011-10-24 ENCOUNTER — Encounter: Payer: Self-pay | Admitting: Internal Medicine

## 2011-10-28 ENCOUNTER — Ambulatory Visit (INDEPENDENT_AMBULATORY_CARE_PROVIDER_SITE_OTHER): Payer: BC Managed Care – PPO | Admitting: Orthopedic Surgery

## 2011-10-28 ENCOUNTER — Other Ambulatory Visit: Payer: Self-pay | Admitting: *Deleted

## 2011-10-28 ENCOUNTER — Encounter: Payer: Self-pay | Admitting: Orthopedic Surgery

## 2011-10-28 VITALS — Ht 67.0 in | Wt 170.0 lb

## 2011-10-28 DIAGNOSIS — IMO0002 Reserved for concepts with insufficient information to code with codable children: Secondary | ICD-10-CM

## 2011-10-28 NOTE — Patient Instructions (Signed)
You have been scheduled for surgery.  All surgeries carry some risk.  Remember you always have the option of continued nonsurgical treatment. However in this situation the risks vs. the benefits favor surgery as the best treatment option. The risks of the surgery includes the following but is not limited to bleeding, infection, pulmonary embolus, death from anesthesia, nerve injury vascular injury or need for further surgery, continued pain.  Specific to this procedure the following risks and complications are rare but possible Stiffness, pain, weakness, re\re tear.

## 2011-10-28 NOTE — Progress Notes (Signed)
Patient ID: Frank Hansen, male   DOB: November 18, 1990, 21 y.o.   MRN: [id number] Chief Complaint  Patient presents with  . Follow-up    Recheck on left knee.   Preop for arthroscopy, LEFT knee, and medial meniscal repair.  Elvina Sidle will bring the meniscal repair kit

## 2011-11-05 ENCOUNTER — Telehealth: Payer: Self-pay | Admitting: Orthopedic Surgery

## 2011-11-05 NOTE — Telephone Encounter (Signed)
Contacted insurer, [phone] regarding outpatient surgery scheduled for 11/13/11 at [facility], CPT codes 29981/29880.  Per Sherol Dade.  In care management operations, no pre-cert is required for this outpatient surgery. Ref # for conversation [id number]

## 2011-11-06 ENCOUNTER — Encounter (HOSPITAL_COMMUNITY): Payer: Self-pay

## 2011-11-06 ENCOUNTER — Encounter (HOSPITAL_COMMUNITY)
Admission: RE | Admit: 2011-11-06 | Discharge: 2011-11-06 | Disposition: A | Discharge status: Home / Self Care | Payer: BC Managed Care – PPO | Source: Ambulatory Visit | Attending: Orthopedic Surgery | Admitting: Orthopedic Surgery

## 2011-11-06 ENCOUNTER — Encounter (HOSPITAL_COMMUNITY): Payer: Self-pay | Admitting: Pharmacy Technician

## 2011-11-06 LAB — HEMOGLOBIN AND HEMATOCRIT, BLOOD
HCT: 39.3 % (ref 39.0–52.0)
Hemoglobin: 13.6 g/dL (ref 13.0–17.0)

## 2011-11-06 LAB — BASIC METABOLIC PANEL
BUN: 11 mg/dL (ref 6–23)
Calcium: 9.9 mg/dL (ref 8.4–10.5)
Chloride: 101 mEq/L (ref 96–112)
Creatinine, Ser: 1.14 mg/dL (ref 0.50–1.35)
GFR calc Af Amer: >90 mL/min (ref >90)
GFR calc non Af Amer: >90 mL/min (ref >90)

## 2011-11-06 LAB — SURGICAL PCR SCREEN
MRSA, PCR: NEGATIVE
Staphylococcus aureus: NEGATIVE

## 2011-11-06 NOTE — Patient Instructions (Addendum)
21 NOE GOYER  11/06/2011   Your procedure is scheduled on:  Friday, 11/13/11  Report to [facility] at [place] AM.  Call this number if you have problems the morning of surgery: [phone]   Remember:   Do not eat food:After Midnight.  May have clear liquids:until Midnight .  Clear liquids include soda, tea, black coffee, apple or grape juice, broth.  Take these medicines the morning of surgery with A SIP OF WATER: pain pill if needed   Do not wear jewelry, make-up or nail polish.  Do not wear lotions, powders, or perfumes. You may wear deodorant.  Do not shave 48 hours prior to surgery.  Do not bring valuables to the hospital.  Contacts, dentures or bridgework may not be worn into surgery.  Leave suitcase in the car. After surgery it may be brought to your room.  For patients admitted to the hospital, checkout time is 11:00 AM the day of discharge.   Patients discharged the day of surgery will not be allowed to drive home.  Name and phone number of your driver: mom  Special Instructions: CHG Shower Use Special Wash: 1/2 bottle night before surgery and 1/2 bottle morning of surgery.   Please read over the following fact sheets that you were given: Pain Booklet, MRSA Information, Surgical Site Infection Prevention, Anesthesia Post-op Instructions and Care and Recovery After Surgery   Arthroscopy Arthroscopy is a procedure in which a caregiver uses an arthroscope. An arthroscope is an instrument that allows your caregiver to look directly into a joint. It is like a small telescope attached to a video camera, and is similar in size to a pencil. Arthroscopes let your caregiver see inside your joint on an attached television monitor. Most joints in the human body can be examined and surgery can be performed through the arthroscope using small incisions. Prior to the use of arthroscopes, surgeries were done with larger open incisions, which requires longer recovery times. On occasion,  arthroscopic procedures result in complications such as bleeding, swelling and pain. If a complication results, a longer recovery and rehabilitation may be required. INDICATIONS Arthroscopic procedures were developed to remove, repair, or replace (reconstruct) damaged tissue. Arthroscopy can be preformed if the procedure involves trimming tissue, removing fragments of cartilage or bone (loose bodies) within joints, suctioning debris, biopsy of tissue, smoothing rough surfaces, removing inflamed tissue, shrinking tissue, or sewing (suturing), tacking, or stapling cartilage and ligaments. What can be done is dependent on many factors. Arthroscopy allows for surgeons to perform certain surgical procedures. Most of the surgeries you can go home the same day as the procedure (outpatient procedures) because the procedure does not cause as much trauma to the patient. Arthroscopy is a valuable diagnostic tool. Radiographs (such as x-ray and CT scans) have poor ability at showing soft tissue, whereas arthroscopy gives the caregiver direct visualization of soft tissue, cartilege, and bone. However, the emergence of magnetic resonance imaging (MRI) has lessened the need for arthroscopy as a diagnostic tool.  TECHNIQUE  Repair and reconstruction arthroscopic techniques may require additional and/or larger incisions than diagnostic arthroscopy portals (1/4 inch incisions). The procedures are often more extensive in repair and reconstruction, than excision procedures. Therefore, the patient may need to stay in the hospital overnight after arthroscopic repair or reconstruction. These procedures also disrupt more tissue, and discomfort may occur, so the temporary use of braces, casts, or crutches, as well as rehabilitation, may be needed.   In order to undergo an arthroscopic procedure,  a complete evaluation is necessary in order to provide the caregiver with as accurate of a diagnosis as possible. Sometimes it is  necessary to perform diagnostic arthroscopy before another surgery can be scheduled.   Both diagnostic and surgical arthroscopy can be performed under local anesthesia (only the joint is numbed), regional anesthesia (the operative limb is numbed), spinal or epidural anesthesia (only the lower extremities are numbed), or general anesthesia (you are completely asleep). The type of anesthetic is dependent on the patient, the surgeon, and the procedure being performed.   If you ask prior to the operation, you may be able to obtain pictures or a video from the arthroscopic camera.   Do not eat or drink anything for at least 8 hours before surgery. Food and drinks (including coffee) make general anesthesia more hazardous.  SEEK MEDICAL CARE IF:  You experience pain, numbness, or coldness in the extremity operated on.   Blue, gray, or dark color appears in the fingers or toenails.   You have increased pain, swelling, redness, drainage, or bleeding in the surgical area despite rest, ice, elevation, and pain medications.   You have signs of infection, including a fever 102 F (38.9 C) or higher.  Document Released: 03/11/2005 Document Revised: 07/30/2011 Document Reviewed: 11/22/2008 [facility] Patient Information 2012 [place], [place].

## 2011-11-12 NOTE — H&P (Signed)
Frank Hansen is an 21 y.o. male.   Chief Complaint: left knee pain   HPI: Previous ACL reconstruction from a basketball injury less than 2 years ago presents back with reinjury of the LEFT knee first of January of this year. The patient was coming down after jumping for ball playing basketball and the knee felt funny when he landed on it. The pain was sudden in onset. Complains of 6/10 constant throbbing pain and pain when he twists the knee. He did have swelling almost immediately. There was no pop.  Past Medical History  Diagnosis Date  . Medial meniscus tear     needs surgery, Dr. Romeo Apple to perform in near future.   . Stomach problems     related to NSAID use    Past Surgical History  Procedure Date  . Acl graft and medial meniscus repair 12/12/10    left, needs repeat surgery for medial meniscus tear in near future, graft intact  . Colonoscopy   . Esophagogastroduodenoscopy     Family History  Problem Relation Age of Onset  . Diabetes    . Colon cancer Neg Hx    Social History:  reports that he has never smoked. He does not have any smokeless tobacco history on file. He reports that he does not drink alcohol or use illicit drugs.  Allergies: No Known Allergies  No current facility-administered medications on file as of .   No current outpatient prescriptions on file as of .    No results found for this or any previous visit (from the past 48 hour(s)). No results found.  Review of Systems  All other systems reviewed and are negative.    There were no vitals taken for this visit. Physical Exam  Constitutional: He appears well-developed and well-nourished.  Eyes: Pupils are equal, round, and reactive to light.  Neck: Normal range of motion.  Cardiovascular: Normal rate.   Respiratory: Effort normal.  GI: Soft.  Musculoskeletal:       Vital signs are stable as recorded  General appearance is normal  The patient is alert and oriented x3  The patient's  mood and affect are normal  Gait assessment: normal  The cardiovascular exam reveals normal pulses and temperature without edema swelling.  The lymphatic system is negative for palpable lymph nodes  The sensory exam is normal.  There are no pathologic reflexes.  Balance is normal.  Upper extremity exam  Inspection and palpation revealed no abnormalities in the upper extremities.  Range of motion is full without contracture.  Motor exam is normal with grade 5 strength.  The joints are fully reduced without subluxation.  There is no atrophy or tremor and muscle tone is normal.  All joints are stable.   Left knee  Small effusion ROM normal  Motor MMT 5/5 Lachman 0 Pivot normal  Medial joint line tender McMurray's Positive   Neurological: He is alert.  Skin: Skin is warm.    MRI: IMPRESSION:  1. Medial tibial bone contusions/marrow edema.  2. New radial tear and inferior articular surface tear involving  the posterior horn of the medial meniscus.  3. Chronic posterior horn lateral meniscus tear.  4. The ACL graft is abnormal. This may be due to mucoid  degeneration or vascularization. Intact fibers are demonstrated.  5. No joint effusion or Baker's cyst.  6. 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  Assessment/Plan Left knee medial meniscus traumatic tear  SALK MEDIAL MENISECTOMY VS REPAIR   Fuller Canada 11/12/2011, 1:05 PM

## 2011-11-13 ENCOUNTER — Encounter (HOSPITAL_COMMUNITY): Payer: Self-pay | Admitting: Anesthesiology

## 2011-11-13 ENCOUNTER — Ambulatory Visit (HOSPITAL_COMMUNITY)
Admission: RE | Admit: 2011-11-13 | Discharge: 2011-11-13 | Disposition: A | Discharge status: Home / Self Care | Payer: BC Managed Care – PPO | Source: Ambulatory Visit | Attending: Orthopedic Surgery | Admitting: Orthopedic Surgery

## 2011-11-13 ENCOUNTER — Encounter (HOSPITAL_COMMUNITY): Payer: Self-pay | Admitting: *Deleted

## 2011-11-13 ENCOUNTER — Encounter (HOSPITAL_COMMUNITY)
Admission: RE | Disposition: A | Discharge status: Home / Self Care | Payer: Self-pay | Source: Ambulatory Visit | Attending: Orthopedic Surgery

## 2011-11-13 ENCOUNTER — Ambulatory Visit (HOSPITAL_COMMUNITY): Payer: BC Managed Care – PPO | Admitting: Anesthesiology

## 2011-11-13 DIAGNOSIS — X500XXA Overexertion from strenuous movement or load, initial encounter: Secondary | ICD-10-CM | POA: Insufficient documentation

## 2011-11-13 DIAGNOSIS — Y9367 Activity, basketball: Secondary | ICD-10-CM | POA: Insufficient documentation

## 2011-11-13 DIAGNOSIS — IMO0002 Reserved for concepts with insufficient information to code with codable children: Secondary | ICD-10-CM | POA: Insufficient documentation

## 2011-11-13 DIAGNOSIS — S83209A Unspecified tear of unspecified meniscus, current injury, unspecified knee, initial encounter: Secondary | ICD-10-CM

## 2011-11-13 SURGERY — ARTHROSCOPY, KNEE, WITH MEDIAL MENISCECTOMY
Anesthesia: General | Site: Knee | Laterality: Left | Wound class: Clean

## 2011-11-13 MED ORDER — PROMETHAZINE HCL 12.5 MG PO TABS
12.5000 mg | ORAL_TABLET | ORAL | Status: DC | PRN
Start: 2011-11-13 — End: 2012-11-05

## 2011-11-13 MED ORDER — LIDOCAINE HCL (CARDIAC) 10 MG/ML IV SOLN
INTRAVENOUS | Status: DC | PRN
  Administered 2011-11-13: 50 mg via INTRAVENOUS

## 2011-11-13 MED ORDER — CHLORHEXIDINE GLUCONATE 4 % EX LIQD
60.0000 mL | Freq: Once | CUTANEOUS | Status: DC
  Filled 2011-11-13: qty 60

## 2011-11-13 MED ORDER — HYDROCODONE-ACETAMINOPHEN 5-325 MG PO TABS: 1.0000 | ORAL_TABLET | ORAL | Status: AC | PRN

## 2011-11-13 MED ORDER — ONDANSETRON HCL 4 MG/2ML IJ SOLN
INTRAMUSCULAR | Status: AC
  Filled 2011-11-13: qty 2

## 2011-11-13 MED ORDER — CEFAZOLIN SODIUM 1-5 GM-% IV SOLN
1.0000 g | Freq: Once | INTRAVENOUS | Status: AC
  Administered 2011-11-13: 1 g via INTRAVENOUS

## 2011-11-13 MED ORDER — KETOROLAC TROMETHAMINE 30 MG/ML IJ SOLN
INTRAMUSCULAR | Status: AC
  Administered 2011-11-13: 30 mg via INTRAVENOUS
  Filled 2011-11-13: qty 1

## 2011-11-13 MED ORDER — LIDOCAINE HCL (PF) 1 % IJ SOLN
INTRAMUSCULAR | Status: AC
  Filled 2011-11-13: qty 5

## 2011-11-13 MED ORDER — CEFAZOLIN SODIUM 1-5 GM-% IV SOLN
INTRAVENOUS | Status: AC
  Filled 2011-11-13: qty 50

## 2011-11-13 MED ORDER — HYDROCODONE-ACETAMINOPHEN 5-325 MG PO TABS
1.0000 | ORAL_TABLET | Freq: Once | ORAL | Status: AC
  Administered 2011-11-13: 1 via ORAL

## 2011-11-13 MED ORDER — FENTANYL CITRATE 0.05 MG/ML IJ SOLN
INTRAMUSCULAR | Status: AC
  Filled 2011-11-13: qty 2

## 2011-11-13 MED ORDER — GLYCOPYRROLATE 0.2 MG/ML IJ SOLN
0.2000 mg | Freq: Once | INTRAMUSCULAR | Status: AC
  Administered 2011-11-13: 0.2 mg via INTRAVENOUS

## 2011-11-13 MED ORDER — ACETAMINOPHEN 325 MG PO TABS: 650.0000 mg | ORAL_TABLET | Freq: Once | ORAL | Status: DC

## 2011-11-13 MED ORDER — ONDANSETRON HCL 4 MG/2ML IJ SOLN
INTRAMUSCULAR | Status: AC
  Administered 2011-11-13: 4 mg via INTRAVENOUS
  Filled 2011-11-13: qty 2

## 2011-11-13 MED ORDER — ONDANSETRON HCL 4 MG/2ML IJ SOLN
4.0000 mg | Freq: Once | INTRAMUSCULAR | Status: AC
  Administered 2011-11-13: 4 mg via INTRAVENOUS

## 2011-11-13 MED ORDER — EPINEPHRINE HCL 1 MG/ML IJ SOLN
INTRAMUSCULAR | Status: AC
  Filled 2011-11-13: qty 5

## 2011-11-13 MED ORDER — PROPOFOL 10 MG/ML IV EMUL
INTRAVENOUS | Status: AC
  Filled 2011-11-13: qty 20

## 2011-11-13 MED ORDER — MIDAZOLAM HCL 2 MG/2ML IJ SOLN
INTRAMUSCULAR | Status: AC
  Filled 2011-11-13: qty 2

## 2011-11-13 MED ORDER — PROPOFOL 10 MG/ML IV EMUL
INTRAVENOUS | Status: DC | PRN
  Administered 2011-11-13: 200 mg via INTRAVENOUS

## 2011-11-13 MED ORDER — HYDROCODONE-ACETAMINOPHEN 5-325 MG PO TABS
ORAL_TABLET | ORAL | Status: AC
  Administered 2011-11-13: 1 via ORAL
  Filled 2011-11-13: qty 1

## 2011-11-13 MED ORDER — EPINEPHRINE HCL 1 MG/ML IJ SOLN
INTRAMUSCULAR | Status: AC
  Filled 2011-11-13: qty 3

## 2011-11-13 MED ORDER — FENTANYL CITRATE 0.05 MG/ML IJ SOLN
INTRAMUSCULAR | Status: AC
  Administered 2011-11-13: 25 ug via INTRAVENOUS
  Filled 2011-11-13: qty 2

## 2011-11-13 MED ORDER — SODIUM CHLORIDE 0.9 % IR SOLN
Status: DC | PRN
  Administered 2011-11-13: 08:00:00

## 2011-11-13 MED ORDER — BUPIVACAINE-EPINEPHRINE PF 0.5-1:200000 % IJ SOLN
INTRAMUSCULAR | Status: AC
  Filled 2011-11-13: qty 20

## 2011-11-13 MED ORDER — LACTATED RINGERS IV SOLN
INTRAVENOUS | Status: DC
  Administered 2011-11-13: 07:00:00 via INTRAVENOUS

## 2011-11-13 MED ORDER — BUPIVACAINE-EPINEPHRINE PF 0.5-1:200000 % IJ SOLN
INTRAMUSCULAR | Status: DC | PRN
  Administered 2011-11-13: 60 mL

## 2011-11-13 MED ORDER — ACETAMINOPHEN 325 MG PO TABS: 325.0000 mg | ORAL_TABLET | ORAL | Status: DC | PRN

## 2011-11-13 MED ORDER — SODIUM CHLORIDE 0.9 % IR SOLN
Status: DC | PRN
  Administered 2011-11-13: 1

## 2011-11-13 MED ORDER — ONDANSETRON HCL 4 MG/2ML IJ SOLN: 4.0000 mg | Freq: Once | INTRAMUSCULAR | Status: DC | PRN

## 2011-11-13 MED ORDER — MIDAZOLAM HCL 2 MG/2ML IJ SOLN
1.0000 mg | INTRAMUSCULAR | Status: DC | PRN
  Administered 2011-11-13: 2 mg via INTRAVENOUS

## 2011-11-13 MED ORDER — FENTANYL CITRATE 0.05 MG/ML IJ SOLN
INTRAMUSCULAR | Status: DC | PRN
  Administered 2011-11-13 (×8): 25 ug via INTRAVENOUS

## 2011-11-13 MED ORDER — LACTATED RINGERS IV SOLN
INTRAVENOUS | Status: DC | PRN
  Administered 2011-11-13: 07:00:00 via INTRAVENOUS

## 2011-11-13 MED ORDER — GLYCOPYRROLATE 0.2 MG/ML IJ SOLN
INTRAMUSCULAR | Status: AC
  Administered 2011-11-13: 0.2 mg via INTRAVENOUS
  Filled 2011-11-13: qty 1

## 2011-11-13 MED ORDER — KETOROLAC TROMETHAMINE 30 MG/ML IJ SOLN
30.0000 mg | Freq: Once | INTRAMUSCULAR | Status: AC
  Administered 2011-11-13: 30 mg via INTRAVENOUS

## 2011-11-13 MED ORDER — FENTANYL CITRATE 0.05 MG/ML IJ SOLN
25.0000 ug | INTRAMUSCULAR | Status: DC | PRN
  Administered 2011-11-13: 25 ug via INTRAVENOUS

## 2011-11-13 SURGICAL SUPPLY — 56 items
ARTHROWAND PARAGON T2 (SURGICAL WAND)
BAG HAMPER (MISCELLANEOUS) ×2 IMPLANT
BANDAGE ELASTIC 6 VELCRO NS (GAUZE/BANDAGES/DRESSINGS) ×2 IMPLANT
BLADE AGGRESSIVE PLUS 4.0 (BLADE) ×2 IMPLANT
BLADE SURG SZ11 CARB STEEL (BLADE) ×2 IMPLANT
CHLORAPREP W/TINT 26ML (MISCELLANEOUS) ×2 IMPLANT
CLOTH BEACON ORANGE TIMEOUT ST (SAFETY) ×2 IMPLANT
COOLER CRYO IC GRAV AND TUBE (ORTHOPEDIC SUPPLIES) ×2 IMPLANT
CUFF CRYO KNEE LG 20X31 COOLER (ORTHOPEDIC SUPPLIES) IMPLANT
CUFF CRYO KNEE18X23 MED (MISCELLANEOUS) ×2 IMPLANT
CUFF TOURNIQUET SINGLE 34IN LL (TOURNIQUET CUFF) ×2 IMPLANT
CUFF TOURNIQUET SINGLE 44IN (TOURNIQUET CUFF) IMPLANT
CUTTER ANGLED DBL BITE 4.5 (BURR) IMPLANT
CUTTER TOMCAT 4.0M (BURR) ×2 IMPLANT
DECANTER SPIKE VIAL GLASS SM (MISCELLANEOUS) ×4 IMPLANT
DRSG PAD ABDOMINAL 8X10 ST (GAUZE/BANDAGES/DRESSINGS) ×2 IMPLANT
FLOOR PAD 36X40 (MISCELLANEOUS) ×2
GAUZE SPONGE 4X4 16PLY XRAY LF (GAUZE/BANDAGES/DRESSINGS) ×2 IMPLANT
GAUZE XEROFORM 5X9 LF (GAUZE/BANDAGES/DRESSINGS) ×2 IMPLANT
GLOVE BIOGEL PI IND STRL 7.0 (GLOVE) ×2 IMPLANT
GLOVE BIOGEL PI INDICATOR 7.0 (GLOVE) ×2
GLOVE ECLIPSE 6.5 STRL STRAW (GLOVE) ×4 IMPLANT
GLOVE SKINSENSE NS SZ8.0 LF (GLOVE) ×1
GLOVE SKINSENSE STRL SZ8.0 LF (GLOVE) ×1 IMPLANT
GLOVE SS N UNI LF 8.5 STRL (GLOVE) ×2 IMPLANT
GOWN STRL REIN XL XLG (GOWN DISPOSABLE) ×6 IMPLANT
HLDR LEG FOAM (MISCELLANEOUS) ×1 IMPLANT
IV NS IRRIG 3000ML ARTHROMATIC (IV SOLUTION) ×14 IMPLANT
KIT BLADEGUARD II DBL (SET/KITS/TRAYS/PACK) ×2 IMPLANT
KIT ROOM TURNOVER AP CYSTO (KITS) ×2 IMPLANT
LEG HOLDER FOAM (MISCELLANEOUS) ×1
MANIFOLD NEPTUNE II (INSTRUMENTS) ×2 IMPLANT
MARKER SKIN DUAL TIP RULER LAB (MISCELLANEOUS) ×2 IMPLANT
NEEDLE HYPO 18GX1.5 BLUNT FILL (NEEDLE) ×2 IMPLANT
NEEDLE HYPO 21X1.5 SAFETY (NEEDLE) ×2 IMPLANT
NEEDLE SPNL 18GX3.5 QUINCKE PK (NEEDLE) ×2 IMPLANT
NS IRRIG 1000ML POUR BTL (IV SOLUTION) ×2 IMPLANT
PACK ARTHRO LIMB DRAPE STRL (MISCELLANEOUS) ×2 IMPLANT
PAD ABD 5X9 TENDERSORB (GAUZE/BANDAGES/DRESSINGS) ×2 IMPLANT
PAD ARMBOARD 7.5X6 YLW CONV (MISCELLANEOUS) ×2 IMPLANT
PAD FLOOR 36X40 (MISCELLANEOUS) ×1 IMPLANT
PADDING CAST ABS 6INX4YD NS (CAST SUPPLIES) ×1
PADDING CAST ABS COTTON 6X4 NS (CAST SUPPLIES) ×1 IMPLANT
PADDING CAST COTTON 6X4 STRL (CAST SUPPLIES) ×2 IMPLANT
SET ARTHROSCOPY INST (INSTRUMENTS) ×2 IMPLANT
SET ARTHROSCOPY PUMP TUBE (IRRIGATION / IRRIGATOR) ×2 IMPLANT
SET BASIN LINEN APH (SET/KITS/TRAYS/PACK) ×2 IMPLANT
SPONGE GAUZE 4X4 12PLY (GAUZE/BANDAGES/DRESSINGS) ×2 IMPLANT
STRIP CLOSURE SKIN 1/2X4 (GAUZE/BANDAGES/DRESSINGS) ×2 IMPLANT
SUT ETHILON 3 0 FSL (SUTURE) ×2 IMPLANT
SYR 30ML LL (SYRINGE) ×2 IMPLANT
SYRINGE 10CC LL (SYRINGE) ×2 IMPLANT
WAND 50 DEG COVAC W/CORD (SURGICAL WAND) ×2 IMPLANT
WAND 90 DEG TURBOVAC W/CORD (SURGICAL WAND) IMPLANT
WAND ARTHRO PARAGON T2 (SURGICAL WAND) IMPLANT
YANKAUER SUCT BULB TIP 10FT TU (MISCELLANEOUS) ×6 IMPLANT

## 2011-11-13 NOTE — Brief Op Note (Signed)
11/13/2011  9:02 AM  PATIENT:  Frank Hansen  21 y.o. male  PRE-OPERATIVE DIAGNOSIS:  tear medial meniscus left knee  POST-OPERATIVE DIAGNOSIS:  tear medial meniscus left knee  PROCEDURE:  Procedure(s) (LRB): KNEE ARTHROSCOPY WITH MEDIAL MENISECTOMY (Left)  SURGEON:  Surgeon(s) and Role:    * Vickki Hearing, MD - Primary  PHYSICIAN ASSISTANT:   ASSISTANTS: none   ANESTHESIA:   none  EBL:  Total I/O In: 700 [I.V.:700] Out: 2 [Blood:2]  BLOOD ADMINISTERED:none  DRAINS: none   LOCAL MEDICATIONS USED:  MARCAINE     SPECIMEN:  No Specimen  DISPOSITION OF SPECIMEN:  N/A  COUNTS:  YES  TOURNIQUET:  * Missing tourniquet times found for documented tourniquets in log:  28070 *  DICTATION: .Dragon Dictation  PLAN OF CARE: Discharge to home after PACU  PATIENT DISPOSITION:  PACU - hemodynamically stable.   Delay start of Pharmacological VTE agent (>24hrs) due to surgical blood loss or risk of bleeding: not applicable

## 2011-11-13 NOTE — Anesthesia Postprocedure Evaluation (Signed)
  Anesthesia Post-op Note  Patient: Frank Hansen  Procedure(s) Performed: Procedure(s) (LRB): KNEE ARTHROSCOPY WITH MEDIAL MENISECTOMY (Left)  Patient Location: PACU  Anesthesia Type: General  Level of Consciousness: awake, alert , oriented and patient cooperative  Airway and Oxygen Therapy: Patient Spontanous Breathing and Patient connected to face mask oxygen  Post-op Pain: none  Post-op Assessment: Post-op Vital signs reviewed, Patient's Cardiovascular Status Stable, Respiratory Function Stable, Patent Airway and No signs of Nausea or vomiting  Post-op Vital Signs: Reviewed and stable  Complications: No apparent anesthesia complications

## 2011-11-13 NOTE — Transfer of Care (Signed)
Immediate Anesthesia Transfer of Care Note  Patient: Frank Hansen  Procedure(s) Performed: Procedure(s) (LRB): KNEE ARTHROSCOPY WITH MEDIAL MENISECTOMY (Left)  Patient Location: PACU  Anesthesia Type: General  Level of Consciousness: awake, alert , oriented and patient cooperative  Airway & Oxygen Therapy: Patient Spontanous Breathing and Patient connected to face mask oxygen  Post-op Assessment: Report given to PACU RN and Post -op Vital signs reviewed and stable  Post vital signs: Reviewed and stable  Complications: No apparent anesthesia complications

## 2011-11-13 NOTE — Anesthesia Preprocedure Evaluation (Signed)
Anesthesia Evaluation  Patient identified by MRN, date of birth, ID band Patient awake    Reviewed: Allergy & Precautions, H&P , NPO status , Patient's Chart, lab work & pertinent test results  Airway Mallampati: I TM Distance: >3 FB Neck ROM: Full    Dental No notable dental hx.    Pulmonary neg pulmonary ROS,    Pulmonary exam normal       Cardiovascular negative cardio ROS  Rhythm:Regular Rate:Normal     Neuro/Psych negative neurological ROS  negative psych ROS   GI/Hepatic negative GI ROS, Neg liver ROS,   Endo/Other  negative endocrine ROS  Renal/GU negative Renal ROS     Musculoskeletal  (+) Arthritis -, Rheumatoid disorders,    Abdominal Normal abdominal exam  (+)   Peds  Hematology negative hematology ROS (+)   Anesthesia Other Findings   Reproductive/Obstetrics                           Anesthesia Physical Anesthesia Plan  ASA: II  Anesthesia Plan: General   Post-op Pain Management:    Induction: Intravenous  Airway Management Planned: LMA  Additional Equipment:   Intra-op Plan:   Post-operative Plan: Extubation in OR  Informed Consent: I have reviewed the patients History and Physical, chart, labs and discussed the procedure including the risks, benefits and alternatives for the proposed anesthesia with the patient or authorized representative who has indicated his/her understanding and acceptance.     Plan Discussed with: CRNA  Anesthesia Plan Comments:         Anesthesia Quick Evaluation

## 2011-11-13 NOTE — Anesthesia Procedure Notes (Signed)
Procedure Name: LMA Insertion Date/Time: 11/13/2011 7:38 AM Performed by: Carolyne Littles, Axxel Gude L Pre-anesthesia Checklist: Patient identified, Timeout performed, Emergency Drugs available, Suction available and Patient being monitored Patient Re-evaluated:Patient Re-evaluated prior to inductionOxygen Delivery Method: Circle system utilized Preoxygenation: Pre-oxygenation with 100% oxygen Intubation Type: IV induction Ventilation: Mask ventilation without difficulty LMA: LMA inserted LMA Size: 4.0 Number of attempts: 1 Tube secured with: Tape Dental Injury: Teeth and Oropharynx as per pre-operative assessment

## 2011-11-13 NOTE — Op Note (Signed)
Preop diagnosis  torn medial meniscus left knee  Postop diagnosis same  Procedure arthroscopy left knee partial medial meniscectomy  Surgeon Romeo Apple  Anesthesia Gen.  Operative findings tear posterior horn medial meniscus, intact anterior cruciate ligament graft. Normal lateral compartment. Thickened medial synovial plica. Mild medial chondromalacia of the patella  Indications for procedure pain mechanical symptoms unresponsive to nonoperative treatment  The patient was identified in the preop holding area as Frank Hansen the left knee was confirmed as a surgical site marked. The chart was reviewed  The patient was taken to the operating room  and general anesthesia was administered. The left leg  was placed in the arthroscopic leg holder, the right leg was placed in a well leg holder  The left leg was then prepped and draped with ChloraPrep  The surgical site was confirmed and the timeout procedure was completed  The lateral portal was injected with Marcaine with epinephrine solution and a stab wound was made. The scope was placed in the lateral portal into the medial compartment. A diagnostic arthroscopy was completed and viewing the knee. A medial portal was established in the same fashion and a probe was placed into the joint. A diagnostic arthroscopy was repeated using a probe to palpate intra-articular structures  The medial compartment was normal in terms of the articular surface. There was a tear of the posterior horn of the medial meniscus  The notch and the anterior cruciate ligament graft was normal The lateral compartment was normal.  The patellofemoral joint showed mild chondromalacia medial patellar facet A duckbill forceps was used to morcellized the meniscal tear the fragments were removed with a motorized shaver. The meniscus was then balanced with an ArthroCare wand 50 probe.  A probe was then used to confirm a stable rim.  The knee was irrigated meniscal  fragments were remaining were removed. The portals were closed with 3-0 nylon suture to on the lateral side one on the medial side. The knee joint was then injected with 60 cc of Marcaine with epinephrine. A sterile dressing was applied followed by an Ace bandage and a Cryo/Cuff.  The patient was extubated and taken to the recovery room in stable condition.

## 2011-11-13 NOTE — Interval H&P Note (Signed)
History and Physical Interval Note:  11/13/2011 7:26 AM  Frank Hansen  has presented today for surgery, with the diagnosis of tear medial meniscus left knee  The various methods of treatment have been discussed with the patient and family. After consideration of risks, benefits and other options for treatment, the patient has consented to  Procedure(s) (LRB):LEFT KNEE ARTHROSCOPY WITH MENISCAL REPAIR (Left) as a surgical intervention .  The patients' history has been reviewed, patient examined, no change in status, stable for surgery.  I have reviewed the patients' chart and labs.  Questions were answered to the patient's satisfaction.     Fuller Canada

## 2011-11-13 NOTE — Preoperative (Signed)
Beta Blockers   Reason not to administer Beta Blockers:Not Applicable 

## 2011-11-16 ENCOUNTER — Ambulatory Visit (INDEPENDENT_AMBULATORY_CARE_PROVIDER_SITE_OTHER): Payer: BC Managed Care – PPO | Admitting: Orthopedic Surgery

## 2011-11-16 ENCOUNTER — Encounter: Payer: Self-pay | Admitting: Orthopedic Surgery

## 2011-11-16 VITALS — BP 120/70 | Ht 67.0 in | Wt 170.0 lb

## 2011-11-16 DIAGNOSIS — Z9889 Other specified postprocedural states: Secondary | ICD-10-CM

## 2011-11-16 NOTE — Patient Instructions (Signed)
OKAY TO START OUTPATIENT THERAPY Monday April 1

## 2011-11-16 NOTE — Progress Notes (Signed)
Patient ID: IBN STIEF, male   DOB: 08-15-1991, 21 y.o.   MRN: [id number] Chief Complaint  Patient presents with  . Follow-up    Postop visit #1 arthroscopy LEFT knee November 13, 2011   Preop diagnosis torn medial meniscus left knee  Postop diagnosis same  Procedure arthroscopy left knee partial medial meniscectomy  Surgeon Romeo Apple  Anesthesia Gen.  Operative findings tear posterior horn medial meniscus, intact anterior cruciate ligament graft. Normal lateral compartment. Thickened medial synovial plica. Mild medial chondromalacia  The patient is doing well he's on crutches his wounds are good his sutures are removed his knee he has mild swelling there no neurovascular complications or compromise  Start therapy next Monday continue home exercises until then weight-bear as tolerated with his crutches when comfortable followup

## 2011-11-25 ENCOUNTER — Ambulatory Visit (HOSPITAL_COMMUNITY)
Admission: RE | Admit: 2011-11-25 | Discharge: 2011-11-25 | Disposition: A | Discharge status: Home / Self Care | Payer: BC Managed Care – PPO | Source: Ambulatory Visit | Attending: Orthopedic Surgery | Admitting: Orthopedic Surgery

## 2011-11-25 DIAGNOSIS — IMO0001 Reserved for inherently not codable concepts without codable children: Secondary | ICD-10-CM | POA: Insufficient documentation

## 2011-11-25 DIAGNOSIS — M25569 Pain in unspecified knee: Secondary | ICD-10-CM | POA: Insufficient documentation

## 2011-11-25 DIAGNOSIS — M25669 Stiffness of unspecified knee, not elsewhere classified: Secondary | ICD-10-CM | POA: Insufficient documentation

## 2011-11-25 DIAGNOSIS — M6281 Muscle weakness (generalized): Secondary | ICD-10-CM | POA: Insufficient documentation

## 2011-11-25 NOTE — Evaluation (Signed)
Physical Therapy Evaluation  Patient Details  Name: Frank Hansen MRN: [id number] Date of Birth: 1991/02/02  Today's Date: 11/25/2011 Time: 4098-1191 Time Calculation (min): 36 min Charges: 1 eval Visit#: 1  of 8   Re-eval: 12/25/11 Assessment Diagnosis: Lt knee scope Surgical Date: 11/13/11  Past Medical History:  Past Medical History  Diagnosis Date  . Medial meniscus tear     needs surgery, Dr. Romeo Apple to perform in near future.   . Stomach problems     related to NSAID use   Past Surgical History:  Past Surgical History  Procedure Date  . Acl graft and medial meniscus repair 12/12/10    left, needs repeat surgery for medial meniscus tear in near future, graft intact  . Colonoscopy   . Esophagogastroduodenoscopy     Subjective Symptoms/Limitations Symptoms: Pt reprots that he was playing basketball at the [facility] and landed wrong on his left knee and reports that he felt something happen. He had surgery on 11/13/11.  Alleviating: ice and extension, Aggravating: bending How long can you walk comfortably?: Difficulty running.  Attempted to run a few seconds outside.   Pain Assessment Currently in Pain?: Yes Pain Score:   7 (when he turns it the wrong the way) Pain Location: Knee  Prior Functioning  Prior Function Vocation: Student Vocation Requirements: [employer] Leisure: Hobbies-yes (Comment) Comments: Basketball, working out  Sensation/Coordination/Flexibility/Functional Tests Functional Tests Functional Tests: Lower Extremity Functional Scale (LEFS): 69/80  Assessment LLE AROM (degrees) Left Knee Extension 0-130: 0  Left Knee Flexion 0-140: 110  LLE Strength LLE Overall Strength Comments: All others WFL Left Hip Flexion: 4/5 Left Knee Flexion:  (R = L at 87.5lbs) Left Knee Extension:  (27 lbs on cybex; RLE: 87.5lbs on cybex) Palpation Palpation: unable to produce pain and tenderness   Mobility/Balance  Ambulation/Gait Ambulation/Gait:  Yes Roe Coombs Joy brace from ACL repair) Gait Pattern: Decreased weight shift to left;Decreased hip/knee flexion - left Static Standing Balance Static Standing - Comment/# of Minutes: Able to complete static standing balance activities with eyes closed without difficulty.   Exercise/Treatments Standing Forward Step Up: 10 reps;Other (comment) (step height: chair) Lunge Walking - Round Trips: 1 RT Other Standing Knee Exercises: Lateral Walks w/squat w/blue band 6 out and back Supine Short Arc Quad Sets: 10 reps (10 sec holds)  Physical Therapy Assessment and Plan PT Assessment and Plan Clinical Impression Statement: Pt is a 21 year old male referred to PT s/p Lt knee scope.  He has a history of Lt knee ACL tear 2 years ago.  After examination it was found that he has current impairments including decreased Lt knee AROM, decreased functional strength, difficulty with running/hopping and impaired percieved functional ability which is limiting him in participating in sporting activities.  Pt will benefit from skilled OP PT in order to address above impairments in order to maximize function.  Rehab Potential: Good PT Frequency: Min 3X/week PT Duration: 6 weeks PT Treatment/Interventions: Gait training;Stair training;Functional mobility training;Therapeutic activities;Therapeutic exercise;Balance training;Neuromuscular re-education;Patient/family education;Other (comment) (Running Program, Modalities PRN) PT Plan: F/U on advanced exercises given today.  Keep patient with pain free motions and gradually build strength.  Start elliptical training, BOSU, cybex, wall squats, step training all directions  (pain free all directions)     Goals Home Exercise Program Pt will Perform Home Exercise Program: Independently PT Goal: Perform Home Exercise Program - Progress: Goal set today PT Short Term Goals Time to Complete Short Term Goals: 3 weeks PT Short Term  Goal 1: Pt will be instructed on a proper return  to running program. PT Short Term Goal 2: Pt will increase his LLE strength to within 60% of RLE. PT Short Term Goal 3: Pt will perform SL hop without an increase in pain with appropriate mechanics.  PT Long Term Goals Time to Complete Long Term Goals: Other (comment) (6 weeks) PT Long Term Goal 1: Pt will improve his LE strength and endurance to WNL and tolerate running for 15 minutes in order to return to sporting activities.   PT Long Term Goal 2: Pt will perform L SL hop within 10% of R LE with appropriate gait mechanics to decrease risk of secondary injury. Long Term Goal 3: Pt will improve his LE AROM to WNL in order to safely return to basketball activities. Long Term Goal 4: Pt will improve his LEFS score to 78/80 for improved percieved functional ability  Problem List Patient Active Problem List  Diagnoses  . RHEUMATOID ARTHRITIS  . KNEE SPRAIN, RIGHT  . MEDIAL MENISCUS TEAR, ACUTE  . ACL TEAR, LEFT KNEE  . KNEE SPRAIN, RIGHT  . Torn meniscus  . Rectal bleeding  . Abdominal pain  . S/P left knee arthroscopy    PT Plan of Care PT Home Exercise Plan: step ups on chair, lunge walking, side steps with blue band and squat, SAQ's w/10 sec holds Consulted and Agree with Plan of Care: Patient  Lillymae Duet 11/25/2011, 4:18 PM  Physician Documentation Your signature is required to indicate approval of the treatment plan as stated above.  Please sign and either send electronically or make a copy of this report for your files and return this physician signed original.   Please mark one 1.__approve of plan  2. ___approve of plan with the following conditions.   ______________________________                                                          _____________________ Physician Signature                                                                                                             Date

## 2011-11-30 ENCOUNTER — Ambulatory Visit (HOSPITAL_COMMUNITY)
Admission: RE | Admit: 2011-11-30 | Discharge: 2011-11-30 | Disposition: A | Discharge status: Home / Self Care | Payer: BC Managed Care – PPO | Source: Ambulatory Visit | Attending: Orthopedic Surgery | Admitting: Orthopedic Surgery

## 2011-11-30 NOTE — Progress Notes (Signed)
Physical Therapy Treatment Patient Details  Name: Frank Hansen MRN: [id number] Date of Birth: 1990-11-03  Today's Date: 11/30/2011 Time: 4098-1191 Time Calculation (min): 40 min Visit#: 2  of 8   Re-eval: 12/25/11 Charges:  therex 30'    Subjective: Symptoms/Limitations Symptoms: Pt. states he was not sore or hurting after last visit.  Pt. reports he has no questions regarding his HEP.  Pain Assessment Currently in Pain?: No/denies  Exercises Instructed by Trilby Leaver, SPTA under the direction of Amy Bascom Levels, PTA/CI. Exercise/Treatments Aerobic Elliptical: 10 minutes L 1 Machines for Strengthening Cybex Knee Extension: 2 PL 10 reps L only Cybex Knee Flexion: 2.5 PL 10 reps L only Standing Lateral Step Up: 15 reps;Step Height: 6" Forward Step Up: 15 reps;Limitations Forward Step Up Limitations: onto large mat Step Down: 15 reps;Step Height: 6" Wall Squat: 15 reps;5 seconds Lunge Walking - Round Trips: 2 RT Other Standing Knee Exercises: Lateral Walks w/squat w/blue band 2RT Other Standing Knee Exercises: lunges onto BOSU 15 reps    Physical Therapy Assessment and Plan PT Assessment and Plan Clinical Impression Statement: Added new activites without c/o pain or difficulties. no pain after session. PT Plan: Add bungee sports cord in all directions and B hopping drills/broad jump next visit.     Problem List Patient Active Problem List  Diagnoses  . RHEUMATOID ARTHRITIS  . KNEE SPRAIN, RIGHT  . MEDIAL MENISCUS TEAR, ACUTE  . ACL TEAR, LEFT KNEE  . KNEE SPRAIN, RIGHT  . Torn meniscus  . Rectal bleeding  . Abdominal pain  . S/P left knee arthroscopy    PT - End of Session Activity Tolerance: Patient tolerated treatment well General Behavior During Session: [facility] for tasks performed Cognition: [facility] for tasks performed    Trilby Leaver, SPTA/ Amy B. Bascom Levels, PTA 11/30/2011, 6:13 PM

## 2011-12-02 ENCOUNTER — Ambulatory Visit (HOSPITAL_COMMUNITY)
Admission: RE | Admit: 2011-12-02 | Discharge: 2011-12-02 | Disposition: A | Discharge status: Home / Self Care | Payer: BC Managed Care – PPO | Source: Ambulatory Visit | Attending: Family Medicine | Admitting: Family Medicine

## 2011-12-02 NOTE — Progress Notes (Signed)
Physical Therapy Treatment Patient Details  Name: Frank Hansen MRN: [id number] Date of Birth: 08/17/1991  Today's Date: 12/02/2011 Time: 7829-5621 Time Calculation (min): 58 min Visit#: 3  of 8   Re-eval: 12/25/11  Charge: therex: 38 min Ice 10 min  Subjective: Symptoms/Limitations Symptoms: Pt stated L knee pain 3/10 today, reports compliance with HEP no questions.  Pain Assessment Currently in Pain?: Yes Pain Score:   3 Pain Location: Knee Pain Orientation: Left  Objective:   Exercise/Treatments Machines for Strengthening Cybex Knee Extension: 2.5 PL 2x 15 L onluy Cybex Knee Flexion: 3 PL 2x 15 L only Plyometrics Bilateral Jumping: Limitations Bilateral Jumping Limitations: 20 x jump in place, A/P;R/L  Broad Jump: 2 sets Other Plyometric Exercises: agility ladder 2 RT forward, sidejump Standing Forward Lunges: 15 reps;Limitations Forward Lunges Limitations: on BOSU Lateral Step Up: 15 reps;Step Height: 6" Forward Step Up: 15 reps;Step Height: 6" Step Down: 15 reps;Step Height: 6" Lunge Walking - Round Trips: 2 RT Other Standing Knee Exercises: Lateral Walks w/squat w/blue band 2RT Other Standing Knee Exercises: thick sports cord 5x each direction   Modalities Modalities: Cryotherapy Cryotherapy Number Minutes Cryotherapy: 10 Minutes Cryotherapy Location: Knee Type of Cryotherapy: Ice pack  Physical Therapy Assessment and Plan PT Assessment and Plan Clinical Impression Statement: Added plyometrics with min cueing for proper landing, pt able to complete with no c/o pain.  Instability noted with sports cord. PT Plan: Continue with current POC, progress to 4 in box circuit jumping next session.    Goals    Problem List Patient Active Problem List  Diagnoses  . RHEUMATOID ARTHRITIS  . KNEE SPRAIN, RIGHT  . MEDIAL MENISCUS TEAR, ACUTE  . ACL TEAR, LEFT KNEE  . KNEE SPRAIN, RIGHT  . Torn meniscus  . Rectal bleeding  . Abdominal pain  . S/P left knee  arthroscopy    PT - End of Session Activity Tolerance: Patient tolerated treatment well General Behavior During Session: [facility] for tasks performed Cognition: [facility] for tasks performed  GP No functional reporting required  Juel Burrow, PTA 12/02/2011, 4:42 PM

## 2011-12-03 ENCOUNTER — Ambulatory Visit (HOSPITAL_COMMUNITY): Admission: RE | Admit: 2011-12-03 | Discharge: 2011-12-03 | Payer: BC Managed Care – PPO | Source: Ambulatory Visit

## 2011-12-03 NOTE — Progress Notes (Signed)
Physical Therapy Treatment Patient Details  Name: TRAMEL WESTBROOK MRN: [id number] Date of Birth: 02-26-1991  Today's Date: 12/03/2011 Time: 7829-5621 Time Calculation (min): 49 min Visit#: 4  of 8   Re-eval: 12/25/11  Charge: therex 38 min  Subjective: Symptoms/Limitations Symptoms: L knee feeling good, compliant with HEP.  Most difficulty I am having now is sitting for long periods of time. Pain Assessment Currently in Pain?: No/denies  Objective:   Exercise/Treatments Aerobic Elliptical: 5' L1 at beginning  Machines for Strengthening Cybex Knee Extension: 4PL 2x 15 L only Cybex Knee Flexion: 4 PL 2x 15 L only Plyometrics Bilateral Jumping: Limitations Bilateral Jumping Limitations: 20 x jump in place, A/P;R/L  Broad Jump: 2 sets Standing Forward Lunges: 20 reps;Limitations Forward Lunges Limitations: on BOSU Lateral Step Up: 15 reps;Hand Hold: 1;Limitations Lateral Step Up Limitations: on BOSU Forward Step Up: Left;15 reps;Hand Hold: 0;Limitations Forward Step Up Limitations: on BOSU Step Down: 15 reps;Step Height: 6" Lunge Walking - Round Trips: 2 RT SLS with Vectors: 5x 10" Other Standing Knee Exercises: Lateral Walks w/squat w/blue band 2RT Other Standing Knee Exercises: thick sports cord 5x each direction     Physical Therapy Assessment and Plan PT Assessment and Plan Clinical Impression Statement: Progressed step ups with visible fatigue but good form and no c/o pain.  Added vector stance to address the instability.  Better control noted with sports cord. PT Plan: Continue with current POC, add sled push and pull next session.    Goals    Problem List Patient Active Problem List  Diagnoses  . RHEUMATOID ARTHRITIS  . KNEE SPRAIN, RIGHT  . MEDIAL MENISCUS TEAR, ACUTE  . ACL TEAR, LEFT KNEE  . KNEE SPRAIN, RIGHT  . Torn meniscus  . Rectal bleeding  . Abdominal pain  . S/P left knee arthroscopy    PT - End of Session Activity Tolerance: Patient  tolerated treatment well General Behavior During Session: [facility] for tasks performed Cognition: [facility] for tasks performed  GP No functional reporting required  Juel Burrow, PTA 12/03/2011, 5:30 PM

## 2011-12-07 ENCOUNTER — Ambulatory Visit (INDEPENDENT_AMBULATORY_CARE_PROVIDER_SITE_OTHER): Payer: BC Managed Care – PPO | Admitting: Orthopedic Surgery

## 2011-12-07 ENCOUNTER — Ambulatory Visit (HOSPITAL_COMMUNITY)
Admission: RE | Admit: 2011-12-07 | Discharge: 2011-12-07 | Disposition: A | Discharge status: Home / Self Care | Payer: BC Managed Care – PPO | Source: Ambulatory Visit | Attending: Family Medicine | Admitting: Family Medicine

## 2011-12-07 ENCOUNTER — Encounter: Payer: Self-pay | Admitting: Orthopedic Surgery

## 2011-12-07 VITALS — BP 120/80 | Ht 67.0 in | Wt 170.0 lb

## 2011-12-07 DIAGNOSIS — Z9889 Other specified postprocedural states: Secondary | ICD-10-CM

## 2011-12-07 NOTE — Progress Notes (Signed)
Patient ID: Frank Hansen, male   DOB: Nov 10, 1990, 21 y.o.   MRN: [id number] Chief Complaint  Patient presents with  . Routine Post Op    post op 2 left knee, DOS 11/13/11     Previous ACL followed by reinjury LEFT knee with torn medial meniscus had a partial medial meniscectomy March 22 one well as range of motion no swelling graft is intact with a negative Lachman test  Patient can resume running in 3 weeks devices would progress up to 2 miles of jogging and then can start running pivoting and cutting  Follow up as needed

## 2011-12-07 NOTE — Patient Instructions (Signed)
Wait 3 weeks before running

## 2011-12-07 NOTE — Progress Notes (Signed)
Physical Therapy Treatment Patient Details  Name: Frank Hansen MRN: [id number] Date of Birth: June 07, 1991  Today's Date: 12/07/2011 Time: 2841-3244 Time Calculation (min): 45 min Charges: 50' TE Visit#: 5  of 8   Re-eval: 12/25/11    Subjective: Symptoms/Limitations Symptoms: Reports that overall he is doing well.  Pain Assessment Currently in Pain?: No/denies  Exercise/Treatments Aerobic Elliptical: 6' L3 Machines for Strengthening Cybex Knee Extension: 4 PL x12; 4.5 x10, 5 PL x8 RLE only Cybex Knee Flexion: 9 PL x12, 9.5 PL x10, 10 PL x8 BLE Plyometrics Other Plyometric Exercises: agility ladder: lateral movements 2 RT, qucik hops (each square) 2 RT, large hops (every other square) 2 RT, high knees 2 RT, lateral high knees 2 RT Standing Functional Squat: 20 reps;Limitations Functional Squat Limitations: BOSU w/8lb weights in hand Lunge Walking - Round Trips: Up and Back on BOSU BLE x5 each Walking with Sports Cord: 5x each direction Other Standing Knee Exercises: Burpes x10, Mountain Climbers 2x30 sec, double hops up to low mat with jump downs and explosive jump x5      Physical Therapy Assessment and Plan PT Assessment and Plan Clinical Impression Statement: Pt continues to improve his overall functional strength and endurance.  Cont to have greatest limitation with proper jumping technique.  Able to tolerate all new activities without an increae in pain. PT Plan: Add sled push and pull next session. Think about D/C next visit    Goals    Problem List Patient Active Problem List  Diagnoses  . RHEUMATOID ARTHRITIS  . KNEE SPRAIN, RIGHT  . MEDIAL MENISCUS TEAR, ACUTE  . ACL TEAR, LEFT KNEE  . KNEE SPRAIN, RIGHT  . Torn meniscus  . Rectal bleeding  . Abdominal pain  . S/P left knee arthroscopy       GP No functional reporting required  Kathey Simer 12/07/2011, 5:41 PM

## 2011-12-09 ENCOUNTER — Ambulatory Visit (HOSPITAL_COMMUNITY)
Admission: RE | Admit: 2011-12-09 | Discharge: 2011-12-09 | Disposition: A | Discharge status: Home / Self Care | Payer: BC Managed Care – PPO | Source: Ambulatory Visit | Attending: Family Medicine | Admitting: Family Medicine

## 2011-12-09 NOTE — Progress Notes (Signed)
Physical Therapy Treatment Patient Details  Name: Frank Hansen MRN: [id number] Date of Birth: [birth year]  Today's Date: 12/09/2011 Time: 4098-1191 Time Calculation (min): 61 min Visit#: 6  of 8   Re-eval: 12/25/11  Charge: therex 41 min Ice 10 min  Subjective: Symptoms/Limitations Symptoms: No pain today; doing good Pain Assessment Currently in Pain?: No/denies  Objective:   Exercise/Treatments Aerobic Elliptical: 10' L3 Machines for Strengthening Cybex Knee Extension: 4 PL x12; 4.5 x10, 5 PL x8 BLE; 1 rep max R 12 Pl, L 10.5 84% Cybex Knee Flexion: 9 PL x12, 9.5 PL x10, 10 PL x8 BLE Plyometrics Other Plyometric Exercises: agility ladder: lateral movements 2 RT, qucik hops (each square) 2 RT, large hops (every other square) 2 RT, high knees 1 RT Standing Forward Lunges: 20 reps;Limitations Forward Lunges Limitations: on BOSU Lunge Walking - Round Trips: Up and Back on BOSU BLE x10 each Other Standing Knee Exercises: sled push/pull with male in sled 5 reps    Physical Therapy Assessment and Plan PT Assessment and Plan Clinical Impression Statement: Pt continues to improve his overall functional strength and endurance.  Discussed d/c with pt per PT previous plan, pt stated MD had hold on running for 3 more weeks.  Did complete 1 rep max quad with L 84% of R LE.   Pt with greatest limitation with proper jumping techniques.  Pt stated pain/discomfort with high knee on agility ladder, ended session and elevated knee with ice with pain relief stated at end.   PT Plan: Assess pain next session, continue with current POC.    Goals    Problem List Patient Active Problem List  Diagnoses  . RHEUMATOID ARTHRITIS  . KNEE SPRAIN, RIGHT  . MEDIAL MENISCUS TEAR, ACUTE  . ACL TEAR, LEFT KNEE  . KNEE SPRAIN, RIGHT  . Torn meniscus  . Rectal bleeding  . Abdominal pain  . S/P left knee arthroscopy    PT - End of Session Activity Tolerance: Patient tolerated treatment  well General Behavior During Session: [facility] for tasks performed Cognition: [facility] for tasks performed  GP No functional reporting required  Juel Burrow, PTA 12/09/2011, 6:05 PM

## 2011-12-10 ENCOUNTER — Ambulatory Visit (HOSPITAL_COMMUNITY)
Admission: RE | Admit: 2011-12-10 | Discharge: 2011-12-10 | Disposition: A | Discharge status: Home / Self Care | Payer: BC Managed Care – PPO | Source: Ambulatory Visit | Attending: Family Medicine | Admitting: Family Medicine

## 2011-12-10 NOTE — Progress Notes (Signed)
Physical Therapy Treatment Patient Details  Name: GWENDOLYN NISHI MRN: [id number] Date of Birth: 12-26-1990  Today's Date: 12/10/2011 Time: 4098-1191 Time Calculation (min): 49 min Visit#: 7  of 8   Re-eval: 12/25/11  Charge: therex 38 min  Subjective: Symptoms/Limitations Symptoms: No pain today, L knee feeling better than yesterday. Pain Assessment Currently in Pain?: No/denies  Objective:   Exercise/Treatments Aerobic Elliptical: 10' L3 Machines for Strengthening Cybex Knee Extension: 5Pl 12x, 5.5 PL 10x, 6Pl 8x L LE Cybex Knee Flexion: 9 PL x12, 9.5 PL x10, 10 PL x8 BLE Plyometrics Bilateral Jumping: Limitations Bilateral Jumping Limitations: 20 x jump in place, A/P;R/L  Other Plyometric Exercises: agility ladder: lateral movements 2 RT, quick hops (each square) 2 RT, large hops (every other square) 2 RT, high knees 4 RT, lateral high knee 2 RT Standing Wall Squat: 10 reps;10 seconds;Limitations Wall Squat Limitations: on BOSU Lunge Walking - Round Trips: Up and Back on BOSU BLE x10 each Other Standing Knee Exercises: sled push/pull with male in sled 5 reps Other Standing Knee Exercises: Burpes 10x, mountain climber 2x 30",     Physical Therapy Assessment and Plan PT Assessment and Plan Clinical Impression Statement: Able to resume agility ladder jumping with no c/o pain, min cueing required for proper jumping techniques.  Pt making gains towards overall strength and endurance, able to do more activities with good form/technique and increase weight on cybex with visible quad fatigue noted.   PT Plan: Reassess next session.    Goals    Problem List Patient Active Problem List  Diagnoses  . RHEUMATOID ARTHRITIS  . KNEE SPRAIN, RIGHT  . MEDIAL MENISCUS TEAR, ACUTE  . ACL TEAR, LEFT KNEE  . KNEE SPRAIN, RIGHT  . Torn meniscus  . Rectal bleeding  . Abdominal pain  . S/P left knee arthroscopy    PT - End of Session Activity Tolerance: Patient tolerated  treatment well General Behavior During Session: [facility] for tasks performed Cognition: [facility] for tasks performed  GP No functional reporting required  Juel Burrow, PTA 12/10/2011, 5:26 PM

## 2011-12-14 ENCOUNTER — Ambulatory Visit (HOSPITAL_COMMUNITY)
Admission: RE | Admit: 2011-12-14 | Discharge: 2011-12-14 | Disposition: A | Discharge status: Home / Self Care | Payer: BC Managed Care – PPO | Source: Ambulatory Visit | Attending: Orthopedic Surgery | Admitting: Orthopedic Surgery

## 2011-12-14 NOTE — Evaluation (Signed)
Physical Therapy Re-Evaluation  Patient Details  Name: Frank Hansen MRN: [id number] Date of Birth: 27-Oct-1990  Today's Date: 12/14/2011 Time: 1700-1740 Time Calculation (min): 40 min Charges: 1 MMT, 60' TE Visit#: 8  of 13   Re-eval: 01/04/12    Past Medical History:  Past Medical History  Diagnosis Date  . Medial meniscus tear     needs surgery, Dr. Romeo Apple to perform in near future.   . Stomach problems     related to NSAID use   Past Surgical History:  Past Surgical History  Procedure Date  . Acl graft and medial meniscus repair 12/12/10    left, needs repeat surgery for medial meniscus tear in near future, graft intact  . Colonoscopy   . Esophagogastroduodenoscopy     Subjective Symptoms/Limitations Symptoms: Pt reports that he is doing well overall without any pain.  He reports that he does not like wearing his knee brace that he was given after his ACL surgery secondary to discomfort.  Pain Assessment Currently in Pain?: No/denies  Precautions/Restrictions  Precautions Precaution Comments: No running until 12/21/11  Assessment RLE Strength RLE Overall Strength Comments: R SL Hop: 70.5 inches LLE Strength LLE Overall Strength Comments: L SL hop: 60 inches Left Knee Extension:  (1 rep max 70lbs  (was 27.5lbs))  Exercise/Treatments Aerobic Elliptical: 10' L6 Machines for Strengthening Cybex Knee Extension: 5Pl 12x, 5.5 PL 10x, 6Pl 8x L LE Cybex Knee Flexion: 9 PL x12, 9.5 PL x10, 10 PL x8 BLE Plyometrics Bilateral Jumping: 10 reps Unilateral Jumping: Limitations Unilateral Jumping Limitations: SL hops on carpet (4 hops each direction) 4 RT Other Plyometric Exercises: agility ladder: lateral movements 3 RT, quick hops (each square) 2 RT, large hops (every other square) 2 RT, high knees 4 RT, lateral high knee 2 RT Standing Other Standing Knee Exercises: sled push/pull with male in sled 5 reps  Physical Therapy Assessment and Plan PT Assessment and  Plan Clinical Impression Statement: [name] has attended 8/8 OP PT visits and is making significant progress towards his overall goals.  Has drastically improved his LE strength, power and endurance, however continues to need PT in order to be trained for a running program to continue with sports activities.  Pt will benefit from skilled OP PT in order to address remaing strength, power and endurance impairrments.  Rehab Potential: Good PT Frequency: Min 2X/week PT Duration: Other (comment) (3 weeks) PT Plan: Running program may begin on 12/21/11.  Cont to address functional sports ability.     Goals Home Exercise Program Pt will Perform Home Exercise Program: Independently PT Short Term Goals Time to Complete Short Term Goals: 3 weeks PT Short Term Goal 1: Pt will be instructed on a proper return to running program. PT Short Term Goal 1 - Progress: Progressing toward goal (limited secondary to running postponed until 4/29 per MD) PT Short Term Goal 2: Pt will increase his LLE strength to within 60% of RLE. PT Short Term Goal 2 - Progress: Met PT Short Term Goal 3: Pt will perform SL hop without an increase in pain with appropriate mechanics.  PT Short Term Goal 3 - Progress: Met PT Long Term Goals Time to Complete Long Term Goals: Other (comment) (6 weeks) PT Long Term Goal 1: Pt will improve his LE strength and endurance to WNL and tolerate running for 15 minutes in order to return to sporting activities.   PT Long Term Goal 1 - Progress: Progressing toward goal PT Long Term Goal 2:  Pt will perform L SL hop within 10% of R LE with appropriate gait mechanics to decrease risk of secondary injury. PT Long Term Goal 2 - Progress: Progressing toward goal Long Term Goal 3: Pt will improve his LE AROM to WNL in order to safely return to basketball activities. Long Term Goal 3 Progress: Progressing toward goal Long Term Goal 4: Pt will improve his LEFS score to 78/80 for improved percieved  functional ability Long Term Goal 4 Progress: Progressing toward goal  Problem List Patient Active Problem List  Diagnoses  . RHEUMATOID ARTHRITIS  . KNEE SPRAIN, RIGHT  . MEDIAL MENISCUS TEAR, ACUTE  . ACL TEAR, LEFT KNEE  . KNEE SPRAIN, RIGHT  . Torn meniscus  . Rectal bleeding  . Abdominal pain  . S/P left knee arthroscopy   Frank Hansen 12/14/2011, 5:52 PM  Physician Documentation Your signature is required to indicate approval of the treatment plan as stated above.  Please sign and either send electronically or make a copy of this report for your files and return this physician signed original.   Please mark one 1.__approve of plan  2. ___approve of plan with the following conditions.   ______________________________                                                          _____________________ Physician Signature                                                                                                             Date

## 2011-12-16 ENCOUNTER — Ambulatory Visit (HOSPITAL_COMMUNITY)
Admission: RE | Admit: 2011-12-16 | Discharge: 2011-12-16 | Disposition: A | Discharge status: Home / Self Care | Payer: BC Managed Care – PPO | Source: Ambulatory Visit | Attending: Orthopedic Surgery | Admitting: Orthopedic Surgery

## 2011-12-16 NOTE — Progress Notes (Signed)
Physical Therapy Treatment Patient Details  Name: Frank Hansen MRN: [id number] Date of Birth: November 19, 1990  Today's Date: 12/16/2011 Time: 4098-1191 Time Calculation (min): 43 min Visit#: 9  of 13   Re-eval: 01/04/12 Charges:  therex 40'    Subjective: Symptoms/Limitations Symptoms: Pt reports no pain or difficulties. Pain Assessment Currently in Pain?: No/denies   Exercise/Treatments Aerobic Elliptical: 10' L4 Machines for Strengthening Cybex Knee Extension: 5Pl 12x, 5.5 PL 10x, 6Pl 8x L LE Cybex Knee Flexion: 9 PL x12, 9.5 PL x10, 10 PL x8 BLE Plyometrics Bilateral Jumping Limitations: 20 x jump in place, A/P;R/L  Unilateral Jumping: Limitations Unilateral Jumping Limitations: SL hops on carpet (4 hops each direction) 4 RT Other Plyometric Exercises: agility ladder: lateral movements 3 RT, quick hops (each square) 2 RT, large hops (every other square) 2 RT, high knees 4 RT, lateral high knee 2 RT Standing Forward Lunges: 20 reps;Limitations Forward Lunges Limitations: on BOSU Wall Squat: 10 reps;10 seconds;Limitations Wall Squat Limitations: on BOSU Lunge Walking - Round Trips: Up and Back on BOSU BLE x10 each Other Standing Knee Exercises: sled push/pull with male in sled 5 reps Other Standing Knee Exercises: Burpes 10x, mountain climber 2x 30",     Physical Therapy Assessment and Plan PT Assessment and Plan Clinical Impression Statement: Pt. able to complete session with minimal breaks and no c/o pain during session.  Pt. with visible increase in power and strength. PT Plan: Add isokinetic biodex training next session; Continue to address functional sports agility and strength.     Problem List Patient Active Problem List  Diagnoses  . RHEUMATOID ARTHRITIS  . KNEE SPRAIN, RIGHT  . MEDIAL MENISCUS TEAR, ACUTE  . ACL TEAR, LEFT KNEE  . KNEE SPRAIN, RIGHT  . Torn meniscus  . Rectal bleeding  . Abdominal pain  . S/P left knee arthroscopy     Trilby Leaver, SPTA/ Dulce Martian B. Bascom Levels, PTA 12/16/2011, 6:08 PM

## 2011-12-18 ENCOUNTER — Ambulatory Visit (HOSPITAL_COMMUNITY)
Admission: RE | Admit: 2011-12-18 | Discharge: 2011-12-18 | Disposition: A | Discharge status: Home / Self Care | Payer: BC Managed Care – PPO | Source: Ambulatory Visit

## 2011-12-18 ENCOUNTER — Telehealth (HOSPITAL_COMMUNITY): Payer: Self-pay

## 2011-12-18 NOTE — Progress Notes (Signed)
Physical Therapy Treatment Patient Details  Name: Frank Hansen MRN: [id number] Date of Birth: 1991-06-11  Today's Date: 12/18/2011 Time: 6962-9528 Time Calculation (min): 39 min Visit#: 10  of 13   Re-eval: 01/04/12  Charge: therex 38 min  Subjective: Symptoms/Limitations Symptoms: Pt came running into dept 15 minutes late for apt.  No pain today. Pain Assessment Currently in Pain?: No/denies  Objective:   Exercise/Treatments Machines for Strengthening Cybex Knee Extension: 5Pl 15x, 5.5 PL 12x, 6Pl 10x L LE Cybex Knee Flexion: 9 PL x15, 9.5 PL x12, 10 PL x 10BLE Plyometrics Unilateral Jumping: Limitations Unilateral Jumping Limitations: SL hops on carpet (4 hops each direction) 4 RT Other Plyometric Exercises: agility ladder: lateral movements 3 RT, quick hops (each square) 2 RT, large hops (every other square) 2 RT, high knees 4 RT, lateral high knee 4 RT Standing Forward Lunges: 20 reps;Limitations Forward Lunges Limitations: on BOSU Wall Squat: 10 seconds;Limitations;15 reps Wall Squat Limitations: on inverted BOSU Other Standing Knee Exercises: Burpes 1st set 15x; 2nd set 10x mountain climber 3x 30", Seated Stool Scoot - Round Trips: Biodex isokinetic 150<-->120<-->90 10 reps  Physical Therapy Assessment and Plan PT Assessment and Plan Clinical Impression Statement: Pt with improved plyometric mechanics.  Began isokinetic with visible fatigue noted but no c/o pain. Pt able to complete all therex with good form and technique with more ease with activity. PT Plan: Continue to address functional sports agility and strength    Goals    Problem List Patient Active Problem List  Diagnoses  . RHEUMATOID ARTHRITIS  . KNEE SPRAIN, RIGHT  . MEDIAL MENISCUS TEAR, ACUTE  . ACL TEAR, LEFT KNEE  . KNEE SPRAIN, RIGHT  . Torn meniscus  . Rectal bleeding  . Abdominal pain  . S/P left knee arthroscopy    PT - End of Session Activity Tolerance: Patient tolerated  treatment well General Behavior During Session: [facility] for tasks performed Cognition: [facility] for tasks performed  GP No functional reporting required  Juel Burrow, PTA 12/18/2011, 6:24 PM

## 2011-12-21 ENCOUNTER — Ambulatory Visit (HOSPITAL_COMMUNITY)
Admission: RE | Admit: 2011-12-21 | Discharge: 2011-12-21 | Disposition: A | Discharge status: Home / Self Care | Payer: BC Managed Care – PPO | Source: Ambulatory Visit | Attending: Orthopedic Surgery | Admitting: Orthopedic Surgery

## 2011-12-21 NOTE — Progress Notes (Signed)
Physical Therapy Treatment Patient Details  Name: Frank Hansen MRN: [id number] Date of Birth: 02-20-1991  Today's Date: 12/21/2011 Time: 1324-4010 Time Calculation (min): 33 min Charges: 33 Te Visit#: 12  of 13   Re-eval: 01/04/12    Subjective: Symptoms/Limitations Symptoms: No complaints today.  Reports increased difficulty with sharp turns.  Pain Assessment Currently in Pain?: No/denies  Precautions/Restrictions     Exercise/Treatments Aerobic Tread Mill: Running 5.5 mph x5 min, walking 2 min after Machines for Strengthening Cybex Knee Extension: 5.5x12, 6 Pl x10, 6.5 x8 Cybex Knee Flexion: 9.5 PL x12, 10 PL x10, 10.5 PL x 10BLE Plyometrics Other Plyometric Exercises: 4 corner hops 2x30 secs Other Plyometric Exercises: Jumps to elevated mat x10; Jump up > off> up from elevated mat x6 Standing Functional Squat: 20 reps;Other (comment) (on BOSU w/orange ball) Wall Squat: Other (comment) (1 min w/ball squeeze) Other Standing Knee Exercises: Cutting/sharp turn training; Suicides 3 RT, lateral suides each direction 2 RT      Physical Therapy Assessment and Plan PT Assessment and Plan Clinical Impression Statement: No reports of pain after all high level intensity training today.  tolerated increased weight, and sharp turn training today. Discussed running 2 miles before starting sprint training and participating in pick up basketball.  PT Plan: Re-eval and plan to DC next visit with running program.     Goals    Problem List Patient Active Problem List  Diagnoses  . RHEUMATOID ARTHRITIS  . KNEE SPRAIN, RIGHT  . MEDIAL MENISCUS TEAR, ACUTE  . ACL TEAR, LEFT KNEE  . KNEE SPRAIN, RIGHT  . Torn meniscus  . Rectal bleeding  . Abdominal pain  . S/P left knee arthroscopy    PT - End of Session Activity Tolerance: Patient tolerated treatment well  GP No functional reporting required  Cj Beecher 12/21/2011, 5:33 PM

## 2011-12-23 ENCOUNTER — Ambulatory Visit (HOSPITAL_COMMUNITY)
Admission: RE | Admit: 2011-12-23 | Discharge: 2011-12-23 | Disposition: A | Discharge status: Home / Self Care | Payer: BC Managed Care – PPO | Source: Ambulatory Visit | Attending: Orthopedic Surgery | Admitting: Orthopedic Surgery

## 2011-12-23 DIAGNOSIS — IMO0001 Reserved for inherently not codable concepts without codable children: Secondary | ICD-10-CM | POA: Insufficient documentation

## 2011-12-23 DIAGNOSIS — M25669 Stiffness of unspecified knee, not elsewhere classified: Secondary | ICD-10-CM | POA: Insufficient documentation

## 2011-12-23 DIAGNOSIS — M6281 Muscle weakness (generalized): Secondary | ICD-10-CM | POA: Insufficient documentation

## 2011-12-23 DIAGNOSIS — M25569 Pain in unspecified knee: Secondary | ICD-10-CM | POA: Insufficient documentation

## 2011-12-23 NOTE — Evaluation (Signed)
Physical Therapy Evaluation  Patient Details  Name: Frank Hansen MRN: [id number] Date of Birth: February 25, 1991  Today's Date: 12/23/2011 Time: 1650-1730 PT Time Calculation (min): 40 min Charges: 30' TE, 10' Self Care Visit#: 13  of 13   Re-eval:      Past Medical History:  Past Medical History  Diagnosis Date  . Medial meniscus tear     needs surgery, Dr. Romeo Apple to perform in near future.   . Stomach problems     related to NSAID use   Past Surgical History:  Past Surgical History  Procedure Date  . Acl graft and medial meniscus repair 12/12/10    left, needs repeat surgery for medial meniscus tear in near future, graft intact  . Colonoscopy   . Esophagogastroduodenoscopy     Subjective Symptoms/Limitations Symptoms: I am playing pick up basketball and I have not had any pain.  Pain Assessment Currently in Pain?: No/denies  Sensation/Coordination/Flexibility/Functional Tests Functional Tests Functional Tests: LEFS: 80/80  Assessment LLE Strength Left Knee Extension:  (R: 95; L 95 lbs)  Exercise/Treatments Aerobic [place]: Running 5.4-5.8 MPH to complete 1 mile run 12 minutes Machines for Strengthening Cybex Knee Extension: 9 PL x20 Cybex Knee Flexion: 10 PL x20 Plyometrics Other Plyometric Exercises: 4 corner hops 2x30 secs Other Plyometric Exercises: Agilitiy ladder: Lateral high knees, lateral movment (ins-outs), forward high knees, short hops, quick feet: 2 RT each exercise Standing Functional Squat: 20 reps;Other (comment) (on BOSU w/orange ball)    Physical Therapy Assessment and Plan PT Assessment and Plan Clinical Impression Statement: Frank Hansen has attended 54 OPPT visits and has met all of his STG and LTG.  Reports he has started to play pick up basketball again, able to run greater than 15 minutes to play basketball.  His LLE 1 rep max is = to RLE.  L SL hop > R SL hop w/appropriate mechanics.  Reports no pain with high level activities.  has  been educated when able to comfortably complete 2 mile run, he can start cutting drills.  Will continue to benefit from high intensity work outs at [employer]  D/C from PT PT Plan: D/C with education to continue with HEP at the gym.    Goals Home Exercise Program Pt will Perform Home Exercise Program: Independently PT Goal: Perform Home Exercise Program - Progress: Met PT Short Term Goals Time to Complete Short Term Goals: 3 weeks PT Short Term Goal 1: Pt will be instructed on a proper return to running program. PT Short Term Goal 1 - Progress: Met PT Short Term Goal 2: Pt will increase his LLE strength to within 60% of RLE. PT Short Term Goal 2 - Progress: Met  (R: 95; L 95 lbs) PT Short Term Goal 3: Pt will perform SL hop without an increase in pain with appropriate mechanics.  PT Short Term Goal 3 - Progress: Met PT Long Term Goals Time to Complete Long Term Goals: Other (comment) (6 weeks) PT Long Term Goal 1: Pt will improve his LE strength and endurance to WNL and tolerate running for 15 minutes in order to return to sporting activities.   PT Long Term Goal 1 - Progress: Met PT Long Term Goal 2: Pt will perform L SL hop within 10% of R LE with appropriate gait mechanics to decrease risk of secondary injury. PT Long Term Goal 2 - Progress: Met (R: 75 in; L: 78 inches) Long Term Goal 3: Pt will improve his LE AROM to WNL  in order to safely return to basketball activities. Long Term Goal 3 Progress: Met Long Term Goal 4: Pt will improve his LEFS score to 78/80 for improved percieved functional ability (80/80) Long Term Goal 4 Progress: Met  Problem List Patient Active Problem List  Diagnoses  . RHEUMATOID ARTHRITIS  . KNEE SPRAIN, RIGHT  . MEDIAL MENISCUS TEAR, ACUTE  . ACL TEAR, LEFT KNEE  . KNEE SPRAIN, RIGHT  . Torn meniscus  . Rectal bleeding  . Abdominal pain  . S/P left knee arthroscopy    PT - End of Session Activity Tolerance: Patient tolerated treatment well PT  Plan of Care PT Patient Instructions: Educated pt on running program and to complete 2 miles comfortably (on indoor or outdoor environment) before beginning a cutting program.  Allowed pt to return to pick up basketball as he reported his is currently playing some without an increase in pain.  Consulted and Agree with Plan of Care: Patient  Frank Hansen 12/23/2011, 5:47 PM  Physician Documentation Your signature is required to indicate approval of the treatment plan as stated above.  Please sign and either send electronically or make a copy of this report for your files and return this physician signed original.   Please mark one 1.__approve of plan  2. ___approve of plan with the following conditions.   ______________________________                                                          _____________________ Physician Signature                                                                                                             Date

## 2012-01-15 ENCOUNTER — Telehealth: Payer: Self-pay | Admitting: Orthopedic Surgery

## 2012-01-15 NOTE — Telephone Encounter (Signed)
Patient requested copy of recent medical records related to surgery for [facility].  Medical release authorization form completed to release to patient per request.  Records picked up today.

## 2012-08-29 ENCOUNTER — Encounter (HOSPITAL_COMMUNITY): Payer: Self-pay | Admitting: Emergency Medicine

## 2012-08-29 ENCOUNTER — Emergency Department (HOSPITAL_COMMUNITY): Payer: BC Managed Care – PPO

## 2012-08-29 ENCOUNTER — Emergency Department (HOSPITAL_COMMUNITY)
Admission: EM | Admit: 2012-08-29 | Discharge: 2012-08-29 | Disposition: A | Discharge status: Home / Self Care | Payer: BC Managed Care – PPO | Attending: Emergency Medicine | Admitting: Emergency Medicine

## 2012-08-29 DIAGNOSIS — R1110 Vomiting, unspecified: Secondary | ICD-10-CM | POA: Insufficient documentation

## 2012-08-29 DIAGNOSIS — R51 Headache: Secondary | ICD-10-CM | POA: Insufficient documentation

## 2012-08-29 DIAGNOSIS — Z8719 Personal history of other diseases of the digestive system: Secondary | ICD-10-CM | POA: Insufficient documentation

## 2012-08-29 DIAGNOSIS — Z87828 Personal history of other (healed) physical injury and trauma: Secondary | ICD-10-CM | POA: Insufficient documentation

## 2012-08-29 MED ORDER — PROMETHAZINE HCL 25 MG/ML IJ SOLN
25.0000 mg | Freq: Once | INTRAMUSCULAR | Status: AC
  Administered 2012-08-29: 25 mg via INTRAMUSCULAR
  Filled 2012-08-29: qty 1

## 2012-08-29 MED ORDER — KETOROLAC TROMETHAMINE 30 MG/ML IJ SOLN
30.0000 mg | Freq: Once | INTRAMUSCULAR | Status: AC
  Administered 2012-08-29: 30 mg via INTRAMUSCULAR
  Filled 2012-08-29: qty 1

## 2012-08-29 NOTE — ED Provider Notes (Signed)
History   Scribed for [employer]. Knoah Nedeau, MD, the patient was seen in room APA09/APA09 . This chart was scribed by Lewanda Rife.   CSN: [id number]  Arrival date & time 08/29/12  4098   First MD Initiated Contact with Patient 08/29/12 1941      Chief Complaint  Patient presents with  . Headache  . Emesis    (Consider location/radiation/quality/duration/timing/severity/associated sxs/prior treatment) HPI Frank Hansen is a 22 y.o. male who presents to the Emergency Department complaining of moderate to severe left-sided headache while trying to sleep this evening. Pt reports 1 episode of emesis after onset of headache. Pt reports having a right-sided headache 2 days ago lasting half the night. Pt denies fall, injury, fever, diarrhea, photophobia, tinnitus, cough, neck pain, and trouble breathing. Pt denies drug use.  Past Medical History  Diagnosis Date  . Medial meniscus tear     needs surgery, Dr. Romeo Apple to perform in near future.   . Stomach problems     related to NSAID use    Past Surgical History  Procedure Date  . Acl graft and medial meniscus repair 12/12/10    left, needs repeat surgery for medial meniscus tear in near future, graft intact  . Colonoscopy   . Esophagogastroduodenoscopy     Family History  Problem Relation Age of Onset  . Diabetes    . Colon cancer Neg Hx     History  Substance Use Topics  . Smoking status: Never Smoker   . Smokeless tobacco: Not on file  . Alcohol Use: No      Review of Systems  Neurological: Positive for headaches.  All other systems reviewed and are negative.    Allergies  Review of patient's allergies indicates no known allergies.  Home Medications   Current Outpatient Rx  Name  Route  Sig  Dispense  Refill  . PANTOPRAZOLE SODIUM 40 MG PO TBEC   Oral   Take 40 mg by mouth daily.         . SUCRALFATE 1 G PO TABS   Oral   Take 1 g by mouth 4 (four) times daily -  before meals and at bedtime.          BP 135/53  Pulse 76  Temp 97.4 F (36.3 C) (Oral)  Resp 16  Ht 5\' 6"  (1.676 m)  Wt 174 lb (78.926 kg)  BMI 28.08 kg/m2  SpO2 100%  Physical Exam  Nursing note and vitals reviewed. Constitutional: He is oriented to person, place, and time. He appears well-developed and well-nourished.  HENT:  Left Ear: Tympanic membrane normal.  Eyes: Conjunctivae normal are normal.  Neck: Normal range of motion.  Cardiovascular: Normal rate and regular rhythm.   Pulmonary/Chest: Effort normal and breath sounds normal.  Abdominal: Soft.  Musculoskeletal: Normal range of motion.       Spasms noted in posterior neck region  Neurological: He is alert and oriented to person, place, and time.  Skin: Skin is warm and dry.  Psychiatric: He has a normal mood and affect.    ED Course  Procedures (including critical care time)  Labs Reviewed - No data to display Ct Head Wo Contrast  08/29/2012  *RADIOLOGY REPORT*  Clinical Data: Left orbital pain.  Posterior headache.  Vomiting.  CT HEAD WITHOUT CONTRAST  Technique:  Contiguous axial images were obtained from the base of the skull through the vertex without contrast.  Comparison: None.  Findings: No mass lesion, mass effect, midline  shift, hydrocephalus, hemorrhage.  No territorial ischemia or acute infarction.  Paranasal sinuses appear within normal limits. Visualized globes appear normal.  IMPRESSION: Negative CT head.   Original Report Authenticated By: Andreas Newport, M.D.      1. Headache       MDM  Patient with a left-sided headache. He had a right-sided headache a couple days ago. He is tender over left posterior musculature. Head CT is negative. Patient is given medicines and will be discharged home. Doubt intracranial hemorrhage. Doubt tumor.      I personally performed the services described in this documentation, which was scribed in my presence. The recorded information has been reviewed and is accurate.     Juliet Rude.  Rubin Payor, MD 08/29/12 2137

## 2012-08-29 NOTE — ED Notes (Signed)
Pt c/o N/V after eating dinner. Pt also reports mild epigastric pain.

## 2012-08-29 NOTE — ED Notes (Signed)
Patient reports developing headache, nausea, emesis, and dizziness approximately 5 minutes after eating. Complaining of headache, left eye pain, and nausea.

## 2012-08-30 ENCOUNTER — Emergency Department (HOSPITAL_COMMUNITY)
Admission: EM | Admit: 2012-08-30 | Discharge: 2012-08-30 | Disposition: A | Discharge status: Home / Self Care | Payer: BC Managed Care – PPO | Attending: Emergency Medicine | Admitting: Emergency Medicine

## 2012-08-30 ENCOUNTER — Encounter (HOSPITAL_COMMUNITY): Payer: Self-pay | Admitting: *Deleted

## 2012-08-30 DIAGNOSIS — B9789 Other viral agents as the cause of diseases classified elsewhere: Secondary | ICD-10-CM | POA: Insufficient documentation

## 2012-08-30 DIAGNOSIS — Z79899 Other long term (current) drug therapy: Secondary | ICD-10-CM | POA: Insufficient documentation

## 2012-08-30 DIAGNOSIS — Z8719 Personal history of other diseases of the digestive system: Secondary | ICD-10-CM | POA: Insufficient documentation

## 2012-08-30 DIAGNOSIS — B349 Viral infection, unspecified: Secondary | ICD-10-CM

## 2012-08-30 LAB — CBC WITH DIFFERENTIAL/PLATELET
Basophils Relative: 0 % (ref 0–1)
Eosinophils Absolute: 0.1 10*3/uL (ref 0.0–0.7)
MCH: 32.3 pg (ref 26.0–34.0)
MCHC: 34.6 g/dL (ref 30.0–36.0)
Neutrophils Relative %: 58 % (ref 43–77)
Platelets: 264 10*3/uL (ref 150–400)

## 2012-08-30 LAB — COMPREHENSIVE METABOLIC PANEL
Alkaline Phosphatase: 79 U/L (ref 39–117)
CO2: 30 mEq/L (ref 19–32)
Chloride: 101 mEq/L (ref 96–112)
Creatinine, Ser: 1.4 mg/dL — ABNORMAL HIGH (ref 0.50–1.35)
GFR calc Af Amer: 83 mL/min — ABNORMAL LOW (ref >90)
Sodium: 137 mEq/L (ref 135–145)
Total Protein: 7.5 g/dL (ref 6.0–8.3)

## 2012-08-30 LAB — URINALYSIS, ROUTINE W REFLEX MICROSCOPIC
Bilirubin Urine: NEGATIVE
Ketones, ur: NEGATIVE mg/dL
Nitrite: NEGATIVE
Specific Gravity, Urine: 1.015 (ref 1.005–1.030)
Urobilinogen, UA: 0.2 mg/dL (ref 0.0–1.0)

## 2012-08-30 LAB — LIPASE, BLOOD: Lipase: 64 U/L — ABNORMAL HIGH (ref 11–59)

## 2012-08-30 MED ORDER — PROMETHAZINE HCL 25 MG PO TABS: 25.0000 mg | ORAL_TABLET | Freq: Four times a day (QID) | ORAL | Status: DC | PRN

## 2012-08-30 MED ORDER — SODIUM CHLORIDE 0.9 % IV SOLN
1000.0000 mL | Freq: Once | INTRAVENOUS | Status: AC
  Administered 2012-08-30: 1000 mL via INTRAVENOUS

## 2012-08-30 MED ORDER — NAPROXEN 500 MG PO TABS: 500.0000 mg | ORAL_TABLET | Freq: Two times a day (BID) | ORAL | Status: DC

## 2012-08-30 MED ORDER — SODIUM CHLORIDE 0.9 % IV SOLN: 1000.0000 mL | INTRAVENOUS | Status: DC

## 2012-08-30 MED ORDER — ONDANSETRON HCL 4 MG/2ML IJ SOLN
4.0000 mg | Freq: Once | INTRAMUSCULAR | Status: AC
  Administered 2012-08-30: 4 mg via INTRAVENOUS
  Filled 2012-08-30: qty 2

## 2012-08-30 MED ORDER — HYDROMORPHONE HCL PF 1 MG/ML IJ SOLN
1.0000 mg | Freq: Once | INTRAMUSCULAR | Status: AC
  Administered 2012-08-30: 1 mg via INTRAVENOUS
  Filled 2012-08-30: qty 1

## 2012-08-30 NOTE — ED Provider Notes (Signed)
History   This chart was scribed for Celene Kras, MD by Donne Anon, ED Scribe. This patient was seen in room APA10/APA10 and the patient's care was started at 1821.   CSN: [id number]  Arrival date & time 08/30/12  1747   First MD Initiated Contact with Patient 08/30/12 1821      Chief Complaint  Patient presents with  . Nausea  . Emesis     The history is provided by the patient. No language interpreter was used.   Frank Hansen is a 22 y.o. male who presents to the Emergency Department complaining of constant, moderate abdominal pain and emesis (2 episodes) which began last night. He reports associated nausea, HA, and diarrhea (4 episodes today). He denies fever, SOB or any other pain. He was seen in the ED yesterday for a HA. He has had body aches. Some coughing. The headache persists today.  No neck pain.  The abdominal pain is diffuse.  It does not radiates.  Nothing seems to particularly help.   He has previously had a polyp removed from his stomach last year for bleeding.  Past Medical History  Diagnosis Date  . Medial meniscus tear     needs surgery, Dr. Romeo Apple to perform in near future.   . Stomach problems     related to NSAID use    Past Surgical History  Procedure Date  . Acl graft and medial meniscus repair 12/12/10    left, needs repeat surgery for medial meniscus tear in near future, graft intact  . Colonoscopy   . Esophagogastroduodenoscopy     Family History  Problem Relation Age of Onset  . Diabetes    . Colon cancer Neg Hx     History  Substance Use Topics  . Smoking status: Never Smoker   . Smokeless tobacco: Not on file  . Alcohol Use: No      Review of Systems  All other systems reviewed and are negative.   10 Systems reviewed and all are negative for acute change except as noted in the HPI.   Allergies  Review of patient's allergies indicates no known allergies.  Home Medications   Current Outpatient Rx  Name  Route  Sig   Dispense  Refill  . PANTOPRAZOLE SODIUM 40 MG PO TBEC   Oral   Take 40 mg by mouth daily.         . SUCRALFATE 1 G PO TABS   Oral   Take 1 g by mouth 4 (four) times daily -  before meals and at bedtime.           Triage Vitals: BP 124/74  Pulse 62  Temp 98.1 F (36.7 C)  Resp 20  SpO2 100%  Physical Exam  Nursing note and vitals reviewed. Constitutional: He appears well-developed and well-nourished. No distress.  HENT:  Head: Normocephalic and atraumatic.  Right Ear: External ear normal.  Left Ear: External ear normal.  Eyes: Conjunctivae normal are normal. Right eye exhibits no discharge. Left eye exhibits no discharge. No scleral icterus.  Neck: Neck supple. No tracheal deviation present.  Cardiovascular: Normal rate, regular rhythm and intact distal pulses.   Pulmonary/Chest: Effort normal and breath sounds normal. No stridor. No respiratory distress. He has no wheezes. He has no rales.  Abdominal: Soft. Bowel sounds are normal. He exhibits no distension. There is tenderness (Mild generalized tenderness). There is no rebound and no guarding.  Musculoskeletal: He exhibits no edema and no tenderness.  Neurological: He is alert. He has normal strength. No sensory deficit. Cranial nerve deficit:  no gross defecits noted. He exhibits normal muscle tone. He displays no seizure activity. Coordination normal.  Skin: Skin is warm and dry. No rash noted.  Psychiatric: He has a normal mood and affect.    ED Course  Procedures (including critical care time) DIAGNOSTIC STUDIES: Oxygen Saturation is 100% on room air, normal by my interpretation.    COORDINATION OF CARE: 6:23 PM Discussed treatment plan which includes medication, blood work and urinalysis with pt at bedside and pt agreed to plan.   Meds ordered this encounter  Medications  . HYDROmorphone (DILAUDID) injection 1 mg    Sig:   . ondansetron (ZOFRAN) injection 4 mg    Sig:   . 0.9 %  sodium chloride infusion     Sig:    Followed by  . 0.9 %  sodium chloride infusion    Sig:       Labs Reviewed  COMPREHENSIVE METABOLIC PANEL - Abnormal; Notable for the following:    Creatinine, Ser 1.40 (*)     GFR calc non Af Amer 71 (*)     GFR calc Af Amer 83 (*)     All other components within normal limits  LIPASE, BLOOD - Abnormal; Notable for the following:    Lipase 64 (*)     All other components within normal limits  URINALYSIS, ROUTINE W REFLEX MICROSCOPIC  CBC WITH DIFFERENTIAL   Ct Head Wo Contrast  08/29/2012  *RADIOLOGY REPORT*  Clinical Data: Left orbital pain.  Posterior headache.  Vomiting.  CT HEAD WITHOUT CONTRAST  Technique:  Contiguous axial images were obtained from the base of the skull through the vertex without contrast.  Comparison: None.  Findings: No mass lesion, mass effect, midline shift, hydrocephalus, hemorrhage.  No territorial ischemia or acute infarction.  Paranasal sinuses appear within normal limits. Visualized globes appear normal.  IMPRESSION: Negative CT head.   Original Report Authenticated By: Andreas Newport, M.D.       MDM  The patient's had viral-type symptoms consisting of cough, headache and myalgias. He had one episode of vomiting and has had some abdominal discomfort. His lipase is mildly elevated but I doubt this is the etiology of his discomfort. This most likely a viral type illness. I doubt appendicitis, bowel obstruction hepatitis colitis or other acute emergency medical issue. Patient will be discharged home with medications for nausea. I instructed to take Tylenol or Advil for pain. It may take him a week or so to start feeling better. He should return to emergency room for any worsening symptoms   I personally performed the services described in this documentation, which was scribed in my presence.  The recorded information has been reviewed and considered.        Celene Kras, MD 08/30/12 2115

## 2012-08-30 NOTE — ED Notes (Signed)
Pt c/o n/v, epigastric pain that started yesterday, was seen in er yesterday for same, states that he did not get any medications to take home for the nausea.

## 2012-08-30 NOTE — ED Notes (Signed)
Patient states that he was seen in the ER yesterday for headache, n/v/d, states he is not feeling any better today.  States 2 episodes of vomiting today, as well as 4 episodes of diarrhea.

## 2012-08-31 IMAGING — CR DG ABDOMEN ACUTE W/ 1V CHEST
3 series · 3 of 3 positions shown · non-contrast
Comparison: None.

CLINICAL DATA: Worsening abdominal pain; history of gastritis due
to NSAIDs.

ACUTE ABDOMEN SERIES (ABDOMEN 2 VIEW & CHEST 1 VIEW)

[view not recorded (1 of 3)]
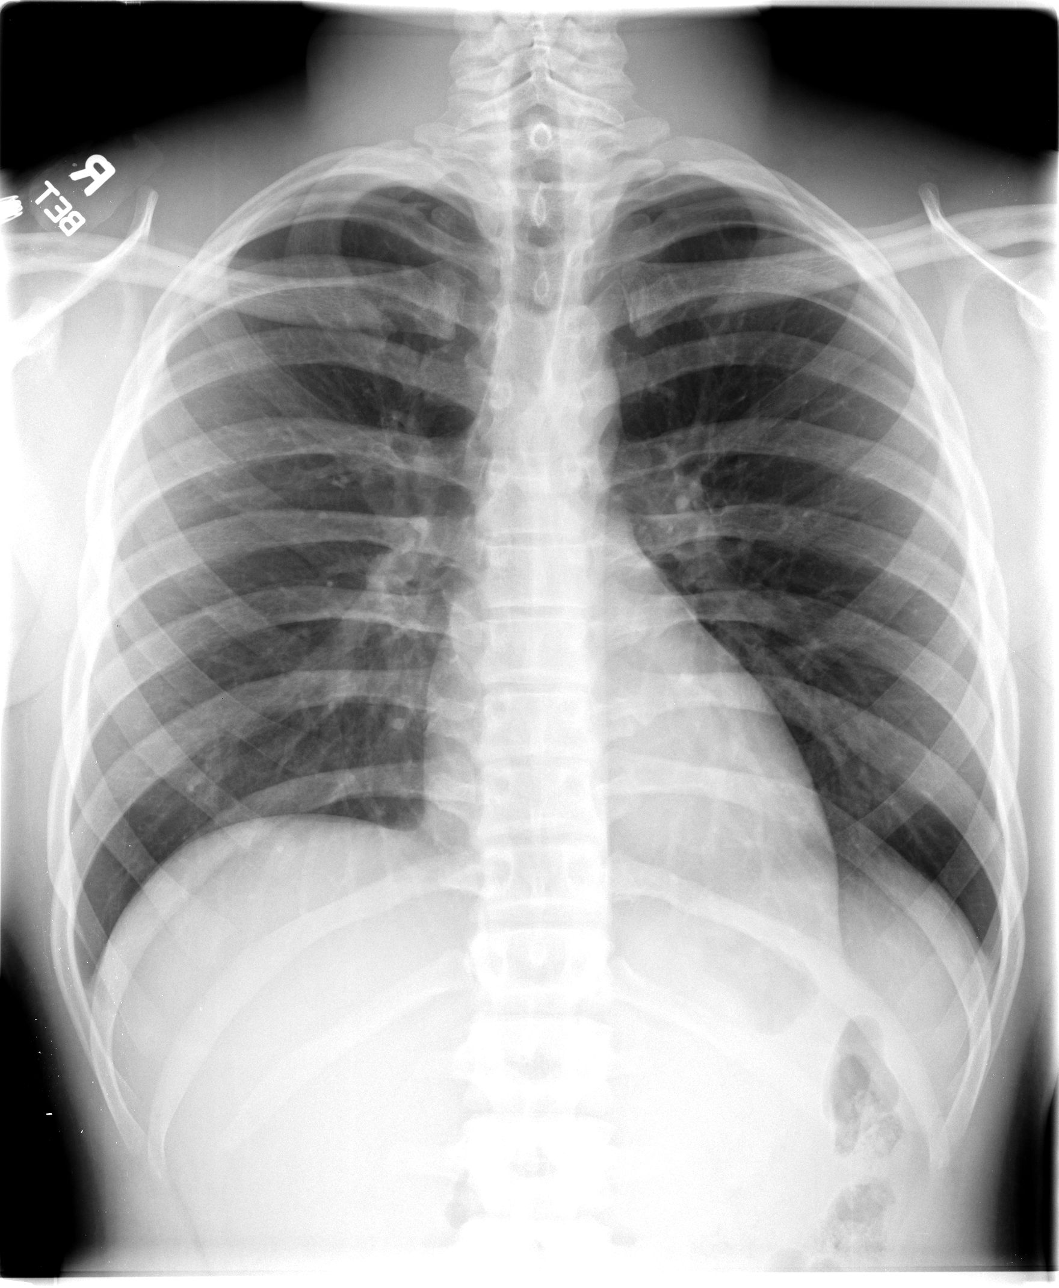

[view not recorded (2 of 3)]
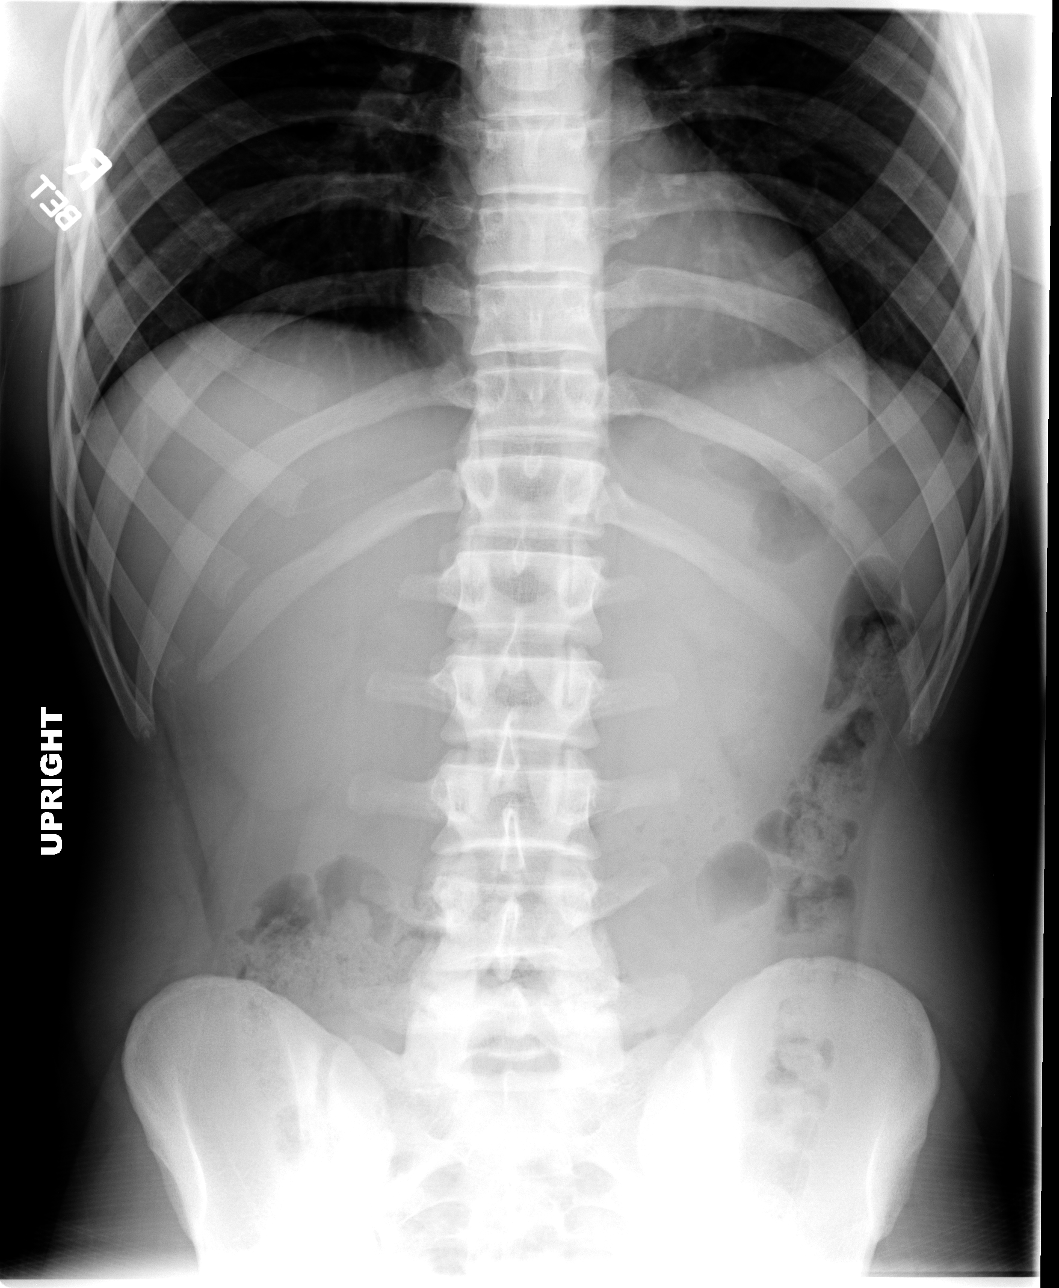

[view not recorded (3 of 3)]
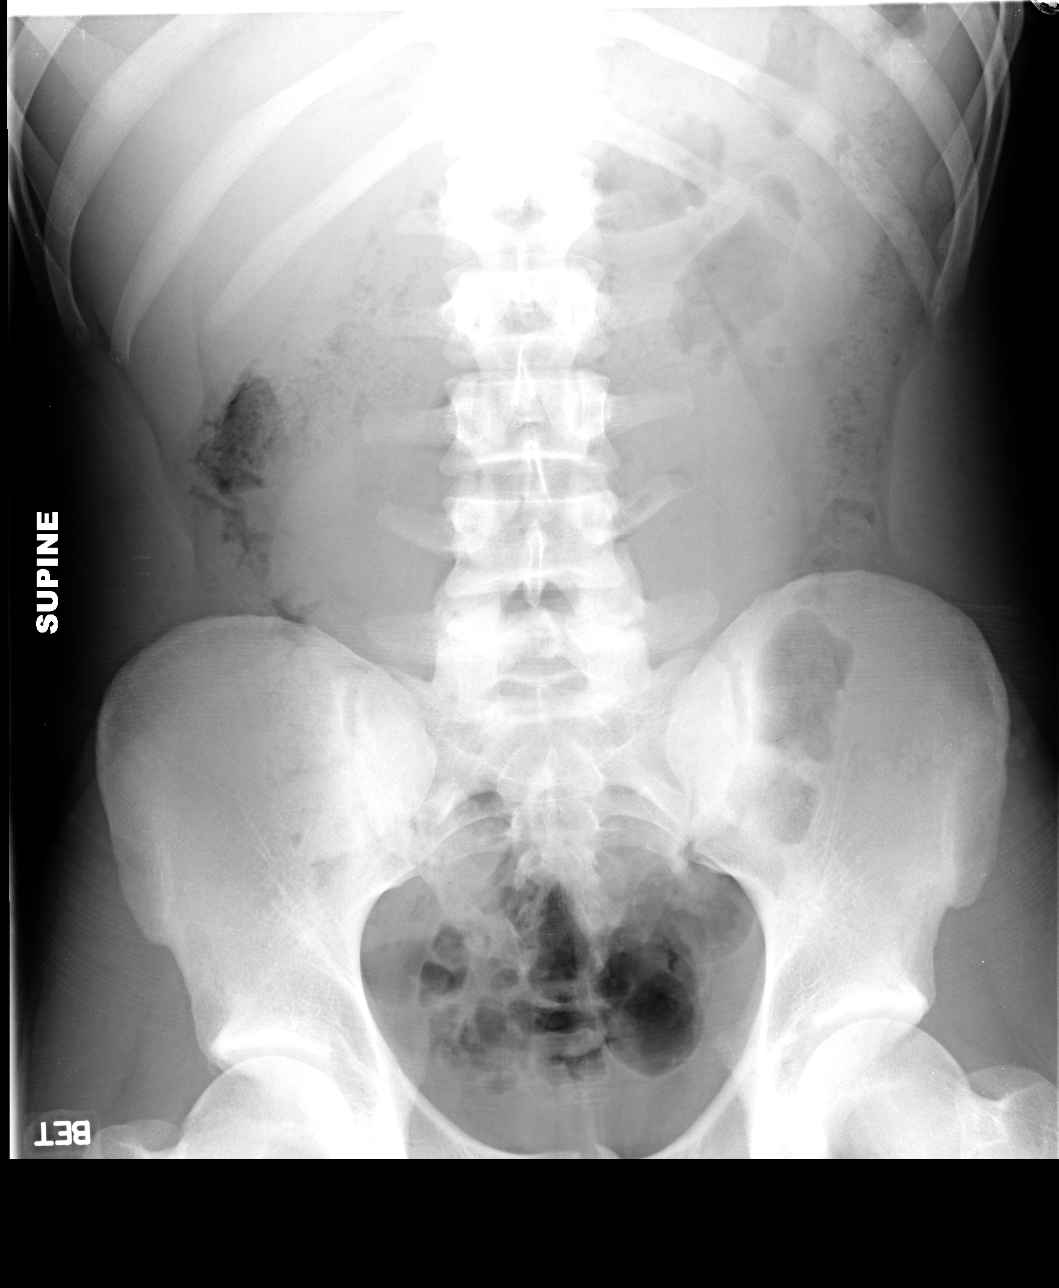

[3 of 3 positions shown; findings below may reference images not displayed]

FINDINGS: The lungs are well-aerated and clear.  There is no
evidence of focal opacification, pleural effusion or pneumothorax.
The cardiomediastinal silhouette is within normal limits.

The visualized bowel gas pattern is unremarkable. Stool and air are
noted throughout the colon; there is no evidence of small bowel
dilatation to suggest obstruction.  A trace amount of air is noted
within the stomach.  No free intra-abdominal air is identified on
the provided upright view.

No acute osseous abnormalities are seen; the sacroiliac joints are
unremarkable in appearance.
IMPRESSION: 1.  Unremarkable bowel gas pattern; no free intra-abdominal air
seen.
2.  No acute cardiopulmonary process identified.

## 2012-11-05 ENCOUNTER — Emergency Department (HOSPITAL_COMMUNITY)
Admission: EM | Admit: 2012-11-05 | Discharge: 2012-11-05 | Disposition: A | Discharge status: Home / Self Care | Payer: BC Managed Care – PPO | Attending: Emergency Medicine | Admitting: Emergency Medicine

## 2012-11-05 ENCOUNTER — Encounter (HOSPITAL_COMMUNITY): Payer: Self-pay | Admitting: *Deleted

## 2012-11-05 DIAGNOSIS — Z8719 Personal history of other diseases of the digestive system: Secondary | ICD-10-CM | POA: Insufficient documentation

## 2012-11-05 DIAGNOSIS — Z87828 Personal history of other (healed) physical injury and trauma: Secondary | ICD-10-CM | POA: Insufficient documentation

## 2012-11-05 DIAGNOSIS — R109 Unspecified abdominal pain: Secondary | ICD-10-CM

## 2012-11-05 DIAGNOSIS — R1013 Epigastric pain: Secondary | ICD-10-CM | POA: Insufficient documentation

## 2012-11-05 LAB — CBC WITH DIFFERENTIAL/PLATELET
Basophils Absolute: 0 10*3/uL (ref 0.0–0.1)
Eosinophils Relative: 2 % (ref 0–5)
Lymphocytes Relative: 29 % (ref 12–46)
Neutro Abs: 6.2 10*3/uL (ref 1.7–7.7)
Platelets: 305 10*3/uL (ref 150–400)
RDW: 13 % (ref 11.5–15.5)
WBC: 9.9 10*3/uL (ref 4.0–10.5)

## 2012-11-05 LAB — COMPREHENSIVE METABOLIC PANEL
ALT: 13 U/L (ref 0–53)
AST: 15 U/L (ref 0–37)
Albumin: 4.2 g/dL (ref 3.5–5.2)
CO2: 28 mEq/L (ref 19–32)
Calcium: 9.9 mg/dL (ref 8.4–10.5)
Chloride: 99 mEq/L (ref 96–112)
GFR calc non Af Amer: >90 mL/min (ref >90)
Sodium: 137 mEq/L (ref 135–145)

## 2012-11-05 LAB — LIPASE, BLOOD: Lipase: 46 U/L (ref 11–59)

## 2012-11-05 MED ORDER — SUCRALFATE 1 G PO TABS: 1.0000 g | ORAL_TABLET | Freq: Four times a day (QID) | ORAL | Status: AC

## 2012-11-05 MED ORDER — FAMOTIDINE IN NACL 20-0.9 MG/50ML-% IV SOLN
20.0000 mg | Freq: Once | INTRAVENOUS | Status: AC
  Administered 2012-11-05: 20 mg via INTRAVENOUS
  Filled 2012-11-05: qty 50

## 2012-11-05 MED ORDER — SODIUM CHLORIDE 0.9 % IV SOLN
Freq: Once | INTRAVENOUS | Status: AC
  Administered 2012-11-05: 21:00:00 via INTRAVENOUS

## 2012-11-05 MED ORDER — GI COCKTAIL ~~LOC~~
30.0000 mL | Freq: Once | ORAL | Status: AC
  Administered 2012-11-05: 30 mL via ORAL
  Filled 2012-11-05: qty 30

## 2012-11-05 MED ORDER — FAMOTIDINE 20 MG PO TABS: 20.0000 mg | ORAL_TABLET | Freq: Two times a day (BID) | ORAL | Status: DC

## 2012-11-05 NOTE — ED Provider Notes (Signed)
History     CSN: [id number]  Arrival date & time 11/05/12  1801   First MD Initiated Contact with Patient 11/05/12 1911      Chief Complaint  Patient presents with  . Abdominal Pain    (Consider location/radiation/quality/duration/timing/severity/associated sxs/prior treatment) HPI This young male with history of GI bleed now presents with epigastric pain.  This episode began approximately 2 weeks ago, since onset has been waxing/waning.  There are no clear precipitants, nor any clear alleviating or exacerbating factors.  The pain is not improved with Prilosec. There is no associated nausea vomiting diarrhea, melena, syncope, dyspnea, chest pain  Past Medical History  Diagnosis Date  . Medial meniscus tear     needs surgery, Dr. Romeo Apple to perform in near future.   . Stomach problems     related to NSAID use    Past Surgical History  Procedure Laterality Date  . Acl graft and medial meniscus repair  12/12/10    left, needs repeat surgery for medial meniscus tear in near future, graft intact  . Colonoscopy    . Esophagogastroduodenoscopy      Family History  Problem Relation Age of Onset  . Diabetes    . Colon cancer Neg Hx     History  Substance Use Topics  . Smoking status: Never Smoker   . Smokeless tobacco: Not on file  . Alcohol Use: No      Review of Systems  Constitutional:       Per HPI, otherwise negative  HENT:       Per HPI, otherwise negative  Respiratory:       Per HPI, otherwise negative  Cardiovascular:       Per HPI, otherwise negative  Gastrointestinal: Negative for vomiting.  Endocrine:       Negative aside from HPI  Genitourinary:       Neg aside from HPI   Musculoskeletal:       Per HPI, otherwise negative  Skin: Negative.   Neurological: Negative for syncope.    Allergies  Review of patient's allergies indicates no known allergies.  Home Medications  No current outpatient prescriptions on file.  BP 149/61  Pulse 58   Temp(Src) 97.9 F (36.6 C) (Oral)  Resp 18  Ht 5\' 7"  (1.702 m)  Wt 163 lb (73.936 kg)  BMI 25.52 kg/m2  SpO2 100%  Physical Exam  Nursing note and vitals reviewed. Constitutional: He is oriented to person, place, and time. He appears well-developed. No distress.  HENT:  Head: Normocephalic and atraumatic.  Eyes: Conjunctivae and EOM are normal.  Cardiovascular: Normal rate and regular rhythm.   Pulmonary/Chest: Effort normal. No stridor. No respiratory distress.  Abdominal: Normal appearance. He exhibits no distension. There is no hepatosplenomegaly. There is tenderness in the epigastric area. There is guarding. There is no rigidity, no rebound and no CVA tenderness.  Musculoskeletal: He exhibits no edema.  Neurological: He is alert and oriented to person, place, and time.  Skin: Skin is warm and dry.  Psychiatric: He has a normal mood and affect.    ED Course  Procedures (including critical care time)  Labs Reviewed  LIPASE, BLOOD  CBC WITH DIFFERENTIAL  COMPREHENSIVE METABOLIC PANEL   No results found.   No diagnosis found.    MDM  This young male presents with epigastric pain for the past few weeks.  Given his history of GI bleed, he had laboratory evaluation.  His labs, vital signs, general physical presentation are all  reassuring for the low suspicion of acute process, beyond likely gastroesophageal irritation.  Given that the patient has been using the Prilosec regularly, he will have carafate added to his meds.  He was advised to f/u w GI as an outpatient. With his youth, absence of RF, and normal VS this is unlikely atypical ACS or thromboembolic disease.  Gerhard Munch, MD 11/05/12 2120

## 2012-11-05 NOTE — ED Notes (Signed)
Upper abd pain that started last night.  Denies n/v/d.

## 2012-11-05 NOTE — ED Notes (Signed)
Discharge instructions reviewed with pt, questions answered. Pt verbalized understanding.  

## 2012-11-06 ENCOUNTER — Emergency Department (HOSPITAL_COMMUNITY): Payer: BC Managed Care – PPO

## 2012-11-06 ENCOUNTER — Encounter (HOSPITAL_COMMUNITY): Payer: Self-pay | Admitting: Emergency Medicine

## 2012-11-06 ENCOUNTER — Emergency Department (HOSPITAL_COMMUNITY)
Admission: EM | Admit: 2012-11-06 | Discharge: 2012-11-06 | Disposition: A | Discharge status: Home / Self Care | Payer: BC Managed Care – PPO | Attending: Emergency Medicine | Admitting: Emergency Medicine

## 2012-11-06 DIAGNOSIS — R109 Unspecified abdominal pain: Secondary | ICD-10-CM

## 2012-11-06 DIAGNOSIS — Z8719 Personal history of other diseases of the digestive system: Secondary | ICD-10-CM | POA: Insufficient documentation

## 2012-11-06 DIAGNOSIS — R1013 Epigastric pain: Secondary | ICD-10-CM | POA: Insufficient documentation

## 2012-11-06 MED ORDER — HYDROCODONE-ACETAMINOPHEN 5-325 MG PO TABS: 1.0000 | ORAL_TABLET | Freq: Four times a day (QID) | ORAL | Status: DC | PRN

## 2012-11-06 MED ORDER — HYDROCODONE-ACETAMINOPHEN 5-325 MG PO TABS
1.0000 | ORAL_TABLET | Freq: Once | ORAL | Status: AC
  Administered 2012-11-06: 1 via ORAL
  Filled 2012-11-06: qty 1

## 2012-11-06 NOTE — ED Notes (Signed)
Unable to have pt sign discharge form in room on computer due to computer not working, pt signed a hard copy and placed in medical records

## 2012-11-06 NOTE — ED Notes (Signed)
Pt c/o mid upper abd pain x 2 weeks. Denies n/v/d/constipation. Pt seen in ed last night for same.

## 2012-11-06 NOTE — ED Notes (Signed)
Pt with epigastric pain off and on for past 2 weeks when pt has ran out of acid reflux meds 2 weeks ago, denies N/V/D

## 2012-11-06 NOTE — ED Provider Notes (Signed)
History     CSN: [id number]  Arrival date & time 11/06/12  1851   First MD Initiated Contact with Patient 11/06/12 1953      Chief Complaint  Patient presents with  . Abdominal Pain    (Consider location/radiation/quality/duration/timing/severity/associated sxs/prior treatment) HPI The patient presents for second time in 2 days with ongoing epigastric pain.  I took care of this patient yesterday.  He notes that since that presentation he continues to have the same pain with no new fever, no new vomiting, no diarrhea, no new dyspnea.  The pain is anterior, superior, minimally radiating, sore and burning.  He has taken 3 doses of Carafate, one dose of Pepcid today.  Past Medical History  Diagnosis Date  . Medial meniscus tear     needs surgery, Dr. Romeo Apple to perform in near future.   . Stomach problems     related to NSAID use    Past Surgical History  Procedure Laterality Date  . Acl graft and medial meniscus repair  12/12/10    left, needs repeat surgery for medial meniscus tear in near future, graft intact  . Colonoscopy    . Esophagogastroduodenoscopy      Family History  Problem Relation Age of Onset  . Diabetes    . Colon cancer Neg Hx     History  Substance Use Topics  . Smoking status: Never Smoker   . Smokeless tobacco: Not on file  . Alcohol Use: No      Review of Systems  Constitutional:       Per HPI, otherwise negative  HENT:       Per HPI, otherwise negative  Respiratory:       Per HPI, otherwise negative  Cardiovascular:       Per HPI, otherwise negative  Gastrointestinal: Negative for vomiting.  Endocrine:       Negative aside from HPI  Genitourinary:       Neg aside from HPI   Musculoskeletal:       Per HPI, otherwise negative  Skin: Negative.   Neurological: Negative for syncope.    Allergies  Review of patient's allergies indicates no known allergies.  Home Medications   Current Outpatient Rx  Name  Route  Sig  Dispense   Refill  . famotidine (PEPCID) 20 MG tablet   Oral   Take 1 tablet (20 mg total) by mouth 2 (two) times daily.   30 tablet   0   . sucralfate (CARAFATE) 1 G tablet   Oral   Take 1 tablet (1 g total) by mouth 4 (four) times daily.   28 tablet   0     BP 139/69  Pulse 55  Temp(Src) 98.4 F (36.9 C) (Oral)  Resp 16  Ht 5\' 7"  (1.702 m)  Wt 163 lb (73.936 kg)  BMI 25.52 kg/m2  SpO2 100%  Physical Exam  Nursing note and vitals reviewed. Constitutional: He is oriented to person, place, and time. He appears well-developed. No distress.  HENT:  Head: Normocephalic and atraumatic.  Eyes: Conjunctivae and EOM are normal.  Cardiovascular: Normal rate and regular rhythm.   Pulmonary/Chest: Effort normal. No stridor. No respiratory distress.  Abdominal: Normal appearance. He exhibits no distension. There is no hepatosplenomegaly. There is tenderness in the epigastric area. There is guarding. There is no rigidity, no rebound and no CVA tenderness.  Musculoskeletal: He exhibits no edema.  Neurological: He is alert and oriented to person, place, and time.  Skin: Skin  is warm and dry.  Psychiatric: He has a normal mood and affect.    ED Course  Procedures (including critical care time)  Labs Reviewed - No data to display Dg Abd Acute W/chest  11/06/2012  *RADIOLOGY REPORT*  Clinical Data: 2-week history of periumbilical abdominal pain.  ACUTE ABDOMEN SERIES (ABDOMEN 2 VIEW & CHEST 1 VIEW)  Comparison: Acute abdomen series 10/15/2011.  Findings: Bowel gas pattern unremarkable without evidence of obstruction or significant ileus.  No evidence of free air or significant air fluid levels on the erect image.  Scattered air- fluid levels in the colon consistent with liquid stool.  No opaque urinary tract calculi.  Regional skeleton unremarkable.  Cardiac silhouette mildly enlarged, at least in part due to technique, but clearly increased in size since the prior study. Lungs clear.   Bronchovascular markings normal.  Pulmonary vascularity normal.  No pneumothorax.  No pleural effusions.  IMPRESSION:  1.  No acute abdominal abnormality. 2.  Mild cardiomegaly with interval increase in the heart size since the examination from February, 2013.   Original Report Authenticated By: Hulan Saas, M.D.      No diagnosis found.  X-ray interpreted by me.  MDM  The patient presents with ongoing abdominal pain.  On exam the patient is hemodynamically stable and in no distress.  Given the very recent initiation of antacid therapy, there suspicion for gastric etiology.  Additional evaluation today included rib x-rays which were unremarkable aside from mild cardiomegaly.  The patient has a primary care physician as well as a gastroenterologist, and was discharged in stable condition with instructions to followup with both of these individuals.      Gerhard Munch, MD 11/06/12 2119

## 2012-11-07 ENCOUNTER — Emergency Department (HOSPITAL_COMMUNITY)
Admission: EM | Admit: 2012-11-07 | Discharge: 2012-11-07 | Disposition: A | Discharge status: Home / Self Care | Payer: BC Managed Care – PPO | Attending: Emergency Medicine | Admitting: Emergency Medicine

## 2012-11-07 ENCOUNTER — Encounter: Payer: Self-pay | Admitting: *Deleted

## 2012-11-07 ENCOUNTER — Encounter (HOSPITAL_COMMUNITY): Payer: Self-pay | Admitting: Emergency Medicine

## 2012-11-07 DIAGNOSIS — Z8739 Personal history of other diseases of the musculoskeletal system and connective tissue: Secondary | ICD-10-CM | POA: Insufficient documentation

## 2012-11-07 DIAGNOSIS — Z8719 Personal history of other diseases of the digestive system: Secondary | ICD-10-CM | POA: Insufficient documentation

## 2012-11-07 DIAGNOSIS — R1013 Epigastric pain: Secondary | ICD-10-CM | POA: Insufficient documentation

## 2012-11-07 MED ORDER — GI COCKTAIL ~~LOC~~
30.0000 mL | Freq: Once | ORAL | Status: AC
  Administered 2012-11-07: 30 mL via ORAL
  Filled 2012-11-07: qty 30

## 2012-11-07 NOTE — ED Notes (Signed)
Pt states over the last 2 weeks he has had abdominal pain that comes and goes. Pt was seen here yesterday. States that since he was seen here yesterday his pain has gotten worse. Pain mostly to upper abdomen and is tender to palpation.

## 2012-11-07 NOTE — ED Provider Notes (Signed)
History     CSN: [id number]  Arrival date & time 11/07/12  0245   First MD Initiated Contact with Patient 11/07/12 [phone]      Chief Complaint  Patient presents with  . Abdominal Pain    (Consider location/radiation/quality/duration/timing/severity/associated sxs/prior treatment) HPI Frank Hansen is a 22 y.o. male who presents to the Emergency Department complaining of abdominal pain. He was seen here yesterday and had xrays done which were normal. He is currently on carafate and pepcid. He has an appointment with Dr. Jena Gauss, GI in the morning. He states that the pepcid and carafate are not working on the epigastric pain.   PCP Dr. Gerda Diss GI Dr. Jena Gauss  Past Medical History  Diagnosis Date  . Medial meniscus tear     needs surgery, Dr. Romeo Apple to perform in near future.   . Stomach problems     related to NSAID use    Past Surgical History  Procedure Laterality Date  . Acl graft and medial meniscus repair  12/12/10    left, needs repeat surgery for medial meniscus tear in near future, graft intact  . Colonoscopy    . Esophagogastroduodenoscopy      Family History  Problem Relation Age of Onset  . Diabetes    . Colon cancer Neg Hx     History  Substance Use Topics  . Smoking status: Never Smoker   . Smokeless tobacco: Not on file  . Alcohol Use: No      Review of Systems  Constitutional: Negative for fever.       10 Systems reviewed and are negative for acute change except as noted in the HPI.  HENT: Negative for congestion.   Eyes: Negative for discharge and redness.  Respiratory: Negative for cough and shortness of breath.   Cardiovascular: Negative for chest pain.  Gastrointestinal: Positive for abdominal pain. Negative for vomiting.  Musculoskeletal: Negative for back pain.  Skin: Negative for rash.  Neurological: Negative for syncope, numbness and headaches.  Psychiatric/Behavioral:       No behavior change.    Allergies  Review of patient's  allergies indicates no known allergies.  Home Medications   Current Outpatient Rx  Name  Route  Sig  Dispense  Refill  . famotidine (PEPCID) 20 MG tablet   Oral   Take 1 tablet (20 mg total) by mouth 2 (two) times daily.   30 tablet   0   . HYDROcodone-acetaminophen (NORCO/VICODIN) 5-325 MG per tablet   Oral   Take 1 tablet by mouth every 6 (six) hours as needed for pain.   12 tablet   0   . sucralfate (CARAFATE) 1 G tablet   Oral   Take 1 tablet (1 g total) by mouth 4 (four) times daily.   28 tablet   0     BP 130/60  Pulse 52  Temp(Src) 97.9 F (36.6 C) (Oral)  Resp 18  Ht 5\' 7"  (1.702 m)  Wt 163 lb (73.936 kg)  BMI 25.52 kg/m2  SpO2 99%  Physical Exam  Nursing note and vitals reviewed. Constitutional: He appears well-developed and well-nourished.  Awake, alert, nontoxic appearance.  HENT:  Head: Normocephalic and atraumatic.  Eyes: EOM are normal. Pupils are equal, round, and reactive to light. Right eye exhibits no discharge. Left eye exhibits no discharge.  Neck: Neck supple.  Cardiovascular: Normal rate and intact distal pulses.   Pulmonary/Chest: Effort normal and breath sounds normal. He exhibits no tenderness.  Abdominal: Soft. Bowel  sounds are normal. There is tenderness. There is no rebound.  Epigastric area painful with palpation  Musculoskeletal: He exhibits no tenderness.  Baseline ROM, no obvious new focal weakness.  Neurological:  Mental status and motor strength appears baseline for patient and situation.  Skin: No rash noted.  Psychiatric: He has a normal mood and affect.    ED Course  Procedures (including critical care time)     MDM  Patient with recurrent abdominal pain. Seen here yesterday, on appropriate H2 blocker and carefate. He has an appointment with GI this morning. Given GI cocktail. Pt stable in ED with no significant deterioration in condition.The patient appears reasonably screened and/or stabilized for discharge and I  doubt any other medical condition or other [facility] requiring further screening, evaluation, or treatment in the ED at this time prior to discharge.  MDM Reviewed: nursing note, vitals and previous chart Reviewed previous: x-ray           Nicoletta Dress. Colon Branch, MD 11/07/12 0330

## 2012-11-08 ENCOUNTER — Ambulatory Visit (INDEPENDENT_AMBULATORY_CARE_PROVIDER_SITE_OTHER): Payer: BC Managed Care – PPO | Admitting: Family Medicine

## 2012-11-08 ENCOUNTER — Encounter: Payer: Self-pay | Admitting: Family Medicine

## 2012-11-08 VITALS — BP 120/74 | HR 80 | Temp 98.1°F | Wt 174.4 lb

## 2012-11-08 DIAGNOSIS — R109 Unspecified abdominal pain: Secondary | ICD-10-CM

## 2012-11-08 MED ORDER — OMEPRAZOLE 40 MG PO CPDR: 40.0000 mg | DELAYED_RELEASE_CAPSULE | Freq: Every day | ORAL | Status: DC

## 2012-11-08 NOTE — Progress Notes (Signed)
  Subjective:    Patient ID: Frank Hansen, male    DOB: [birth year], 22 y.o.   MRN: [id number]  Abdominal Pain This is a recurrent problem. The current episode started more than 1 year ago. The onset quality is gradual. The problem occurs intermittently. The most recent episode lasted 5 hours. The problem has been unchanged. The pain is located in the epigastric region. The pain is at a severity of 6/10. The pain is moderate. The quality of the pain is burning and sharp. The abdominal pain radiates to the epigastric region. Associated symptoms include belching. Pertinent negatives include no anorexia, diarrhea, fever or headaches. The pain is aggravated by eating. The pain is relieved by nothing. He has tried H2 blockers for the symptoms. The treatment provided no relief. There is no history of abdominal surgery or colon cancer.      Review of Systems  Constitutional: Negative for fever and activity change.  Gastrointestinal: Positive for abdominal pain. Negative for diarrhea and anorexia.  Neurological: Negative for headaches.  All other systems reviewed and are negative.       Objective:   Physical Exam  Vitals reviewed. Constitutional: He is oriented to person, place, and time. He appears well-developed.  HENT:  Head: Normocephalic.  Eyes: Pupils are equal, round, and reactive to light.  Neck: No thyromegaly present.  Cardiovascular: Normal rate and regular rhythm.   Pulmonary/Chest: Effort normal. No respiratory distress.  Abdominal: Soft. There is tenderness (epigastric). There is no rebound.  Musculoskeletal: Normal range of motion.  Neurological: He is alert and oriented to person, place, and time.  Skin: Skin is warm.          Assessment & Plan:  Patient continues to have persistent recurrence of epigastric discomfort. Because he has had to go to the emergency room several times in the past week, I feel a consultation with the GI folks once again is warranted.  Discussed with patient. We have changed his medicines. Please see medication chart. Warning signs were reviewed with patient.

## 2012-11-21 ENCOUNTER — Encounter: Payer: Self-pay | Admitting: Internal Medicine

## 2012-11-23 ENCOUNTER — Encounter: Payer: Self-pay | Admitting: Gastroenterology

## 2012-11-23 ENCOUNTER — Ambulatory Visit (INDEPENDENT_AMBULATORY_CARE_PROVIDER_SITE_OTHER): Payer: BC Managed Care – PPO | Admitting: Gastroenterology

## 2012-11-23 VITALS — BP 128/74 | HR 49 | Temp 98.2°F | Ht 66.0 in | Wt 168.2 lb

## 2012-11-23 DIAGNOSIS — I517 Cardiomegaly: Secondary | ICD-10-CM | POA: Insufficient documentation

## 2012-11-23 DIAGNOSIS — Z860100 Personal history of colon polyps, unspecified: Secondary | ICD-10-CM | POA: Insufficient documentation

## 2012-11-23 DIAGNOSIS — R1013 Epigastric pain: Secondary | ICD-10-CM

## 2012-11-23 MED ORDER — HYDROCODONE-ACETAMINOPHEN 5-325 MG PO TABS: 1.0000 | ORAL_TABLET | Freq: Four times a day (QID) | ORAL | Status: DC | PRN

## 2012-11-23 NOTE — Progress Notes (Signed)
Primary Care Physician:  Harlow Asa, MD  Primary Gastroenterologist:  Roetta Sessions, MD   Chief Complaint  Patient presents with  . Abdominal Pain    HPI:  Frank Hansen is a 22 y.o. male here pp epigastric stabbing pain, some nausea. Symptoms began about 4 weeks ago. No vomiting. States it is similar to symptoms that he had about a year ago. That time he reported rectal bleeding and melena as well. He has had a history of NSAID use off and on for knee problems. He denies any recent NSAIDS. Started on Omeprazole 40mg  once daily two weeks ago. Helps sometimes. No heartburn. No constipation, diarrhea, melena, brbpr. No NSAIDs/ASA. Some nocturnal epigastric. No etoh use. Symptoms for one month. Reports 6 pounds weight loss due to fear of eating.   Current Outpatient Prescriptions  Medication Sig Dispense Refill  . HYDROcodone-acetaminophen (NORCO/VICODIN) 5-325 MG per tablet Take 1 tablet by mouth every 6 (six) hours as needed for pain.  12 tablet  0  . omeprazole (PRILOSEC) 40 MG capsule Take 1 capsule (40 mg total) by mouth daily.  30 capsule  3   No current facility-administered medications for this visit.    Allergies as of 11/23/2012  . (No Known Allergies)    Past Medical History  Diagnosis Date  . Medial meniscus tear     needs surgery, Dr. Romeo Apple to perform in near future.   . Stomach problems     related to NSAID use  . Hx of adenomatous colonic polyps 09/2011    Surveillance colonoscopy due 09/2016    Past Surgical History  Procedure Laterality Date  . Acl graft and medial meniscus repair  12/12/10    left, needs repeat surgery for medial meniscus tear in near future, graft intact  . Colonoscopy  09/2011    AVW:[id number] rectal polyp-tubular adenoma. Normal TI. Next TCS in 09/2016.  Marland Kitchen Esophagogastroduodenoscopy  09/2011    JYN:WGNFA hiatal hernia otherwise negative exam.     Family History  Problem Relation Age of Onset  . Diabetes    . Colon cancer Neg Hx      History   Social History  . Marital Status: Single    Spouse Name: N/A    Number of Children: 0  . Years of Education: N/A   Occupational History  . student     [employer], Personnel officer   .     Social History Main Topics  . Smoking status: Never Smoker   . Smokeless tobacco: Not on file  . Alcohol Use: No  . Drug Use: No  . Sexually Active: Not on file   Other Topics Concern  . Not on file   Social History Narrative  . No narrative on file      ROS:  General: Negative for anorexia, weight loss, fever, chills, fatigue, weakness. Eyes: Negative for vision changes.  ENT: Negative for hoarseness, difficulty swallowing , nasal congestion. CV: Negative for chest pain, angina, palpitations, dyspnea on exertion, peripheral edema.  Respiratory: Negative for dyspnea at rest, dyspnea on exertion, cough, sputum, wheezing.  GI: See history of present illness. GU:  Negative for dysuria, hematuria, urinary incontinence, urinary frequency, nocturnal urination.  MS: Negative for joint pain, low back pain.  Derm: Negative for rash or itching.  Neuro: Negative for weakness, abnormal sensation, seizure, frequent headaches, memory loss, confusion.  Psych: Negative for anxiety, depression, suicidal ideation, hallucinations.  Endo: Negative for unusual weight change.  Heme: Negative for bruising or bleeding.  Allergy: Negative for rash or hives.    Physical Examination:  BP 128/74  Pulse 49  Temp(Src) 98.2 F (36.8 C) (Oral)  Ht 5\' 6"  (1.676 m)  Wt 168 lb 3.2 oz (76.295 kg)  BMI 27.16 kg/m2   General: Well-nourished, well-developed in no acute distress.  Head: Normocephalic, atraumatic.   Eyes: Conjunctiva pink, no icterus. Mouth: Oropharyngeal mucosa moist and pink , no lesions erythema or exudate. Neck: Supple without thyromegaly, masses, or lymphadenopathy.  Lungs: Clear to auscultation bilaterally.  Heart: Regular rate and rhythm, no murmurs rubs or  gallops.  Abdomen: Bowel sounds are normal, epigastric/RUQ tenderness moderate, nondistended, no hepatosplenomegaly or masses, no abdominal bruits or    hernia , no rebound or guarding.   Rectal: not performed Extremities: No lower extremity edema. No clubbing or deformities.  Neuro: Alert and oriented x 4 , grossly normal neurologically.  Skin: Warm and dry, no rash or jaundice.   Psych: Alert and cooperative, normal mood and affect.  Labs: Lab Results  Component Value Date   LIPASE 46 11/05/2012   Lab Results  Component Value Date   WBC 9.9 11/05/2012   HGB 16.1 11/05/2012   HCT 45.8 11/05/2012   MCV 94.6 11/05/2012   PLT 305 11/05/2012   Lab Results  Component Value Date   CREATININE 1.07 11/05/2012   BUN 14 11/05/2012   NA 137 11/05/2012   K 4.2 11/05/2012   CL 99 11/05/2012   CO2 28 11/05/2012   Lab Results  Component Value Date   ALT 13 11/05/2012   AST 15 11/05/2012   ALKPHOS 94 11/05/2012   BILITOT 0.2* 11/05/2012   08/2012: Lipase 64H   Imaging Studies: Dg Abd Acute W/chest  11/06/2012  *RADIOLOGY REPORT*  Clinical Data: 2-week history of periumbilical abdominal pain.  ACUTE ABDOMEN SERIES (ABDOMEN 2 VIEW & CHEST 1 VIEW)  Comparison: Acute abdomen series 10/15/2011.  Findings: Bowel gas pattern unremarkable without evidence of obstruction or significant ileus.  No evidence of free air or significant air fluid levels on the erect image.  Scattered air- fluid levels in the colon consistent with liquid stool.  No opaque urinary tract calculi.  Regional skeleton unremarkable.  Cardiac silhouette mildly enlarged, at least in part due to technique, but clearly increased in size since the prior study. Lungs clear.  Bronchovascular markings normal.  Pulmonary vascularity normal.  No pneumothorax.  No pleural effusions.  IMPRESSION:  1.  No acute abdominal abnormality. 2.  Mild cardiomegaly with interval increase in the heart size since the examination from February, 2013.   Original Report  Authenticated By: Hulan Saas, M.D.

## 2012-11-23 NOTE — Progress Notes (Signed)
Pt is schedule for abd [place] on November 28, 2012 @ 7:45

## 2012-11-23 NOTE — Progress Notes (Signed)
CC PCP 

## 2012-11-23 NOTE — Assessment & Plan Note (Signed)
Patient was advised that he will need to have a followup colonoscopy in February 2018. Importance stressed with patient and he voiced understanding.

## 2012-11-23 NOTE — Assessment & Plan Note (Addendum)
Recurrent postprandial epigastric pain. Evaluated for the same in February 2013, he had a small hiatal hernia but no other abnormalities. He was on NSAIDs at the time. Seen in the emergency department in January and in March of this year for the same. In January he had a minimally elevated lipase of 64. His LFTs have been normal on both occasions. CBC normal as well. Abdominal film unremarkable 2 weeks ago. Minimal response to omeprazole. At this point would like to rule out biliary etiology. Abdominal ultrasound planned. He will continue omeprazole 40 mg daily. He should report to the emergency department if he develops black or bloody stools, worsening abdominal pain. Further recommendations to follow.

## 2012-11-23 NOTE — Assessment & Plan Note (Signed)
Mild cardiomegaly on chest x-ray. Further management per PCP.

## 2012-11-23 NOTE — Patient Instructions (Addendum)
Please have your abdominal ultrasound done as scheduled. If you develop black stool, bloody stool, worsening abdominal pain please go to the nearest emergency department.

## 2012-11-28 ENCOUNTER — Ambulatory Visit (HOSPITAL_COMMUNITY)
Admission: RE | Admit: 2012-11-28 | Discharge: 2012-11-28 | Disposition: A | Discharge status: Home / Self Care | Payer: BC Managed Care – PPO | Source: Ambulatory Visit | Attending: Gastroenterology | Admitting: Gastroenterology

## 2012-11-28 DIAGNOSIS — R1013 Epigastric pain: Secondary | ICD-10-CM | POA: Insufficient documentation

## 2012-12-05 ENCOUNTER — Other Ambulatory Visit: Payer: Self-pay | Admitting: Gastroenterology

## 2012-12-05 DIAGNOSIS — R1013 Epigastric pain: Secondary | ICD-10-CM

## 2012-12-05 NOTE — Progress Notes (Signed)
Patient is scheduled for Thursday April 17th at 8:00 am and I have LMOM for him to call and confirm

## 2012-12-05 NOTE — Progress Notes (Signed)
Quick Note:  abd u/s negative. Recommend HIDA with fatty meal challenge to complete gallbladder w/u. How is he doing? ______

## 2012-12-06 ENCOUNTER — Encounter (HOSPITAL_COMMUNITY): Payer: BC Managed Care – PPO

## 2012-12-08 ENCOUNTER — Encounter (HOSPITAL_COMMUNITY)
Admission: RE | Admit: 2012-12-08 | Discharge: 2012-12-08 | Disposition: A | Discharge status: Home / Self Care | Payer: BC Managed Care – PPO | Source: Ambulatory Visit | Attending: Gastroenterology | Admitting: Gastroenterology

## 2012-12-08 ENCOUNTER — Encounter (HOSPITAL_COMMUNITY): Payer: Self-pay

## 2012-12-08 DIAGNOSIS — R1013 Epigastric pain: Secondary | ICD-10-CM | POA: Insufficient documentation

## 2012-12-08 MED ORDER — TECHNETIUM TC 99M MEBROFENIN IV KIT
5.0000 | PACK | Freq: Once | INTRAVENOUS | Status: AC | PRN
  Administered 2012-12-08: 5 via INTRAVENOUS

## 2012-12-08 MED ORDER — SINCALIDE 5 MCG IJ SOLR
INTRAMUSCULAR | Status: AC
  Administered 2012-12-08: 1.59 ug via INTRAVENOUS
  Filled 2012-12-08: qty 5

## 2012-12-14 NOTE — Progress Notes (Signed)
Quick Note:  GB work up is negative. How is he doing?  ______

## 2012-12-15 NOTE — Progress Notes (Signed)
Quick Note:  Pt is aware, he said he was doing good now. ______

## 2013-01-23 ENCOUNTER — Encounter (HOSPITAL_COMMUNITY): Payer: Self-pay | Admitting: *Deleted

## 2013-01-23 ENCOUNTER — Emergency Department (HOSPITAL_COMMUNITY)
Admission: EM | Admit: 2013-01-23 | Discharge: 2013-01-24 | Disposition: A | Discharge status: Home / Self Care | Payer: BC Managed Care – PPO | Attending: Emergency Medicine | Admitting: Emergency Medicine

## 2013-01-23 DIAGNOSIS — IMO0002 Reserved for concepts with insufficient information to code with codable children: Secondary | ICD-10-CM

## 2013-01-23 DIAGNOSIS — S01501A Unspecified open wound of lip, initial encounter: Secondary | ICD-10-CM | POA: Insufficient documentation

## 2013-01-23 DIAGNOSIS — Y9239 Other specified sports and athletic area as the place of occurrence of the external cause: Secondary | ICD-10-CM | POA: Insufficient documentation

## 2013-01-23 DIAGNOSIS — Z87828 Personal history of other (healed) physical injury and trauma: Secondary | ICD-10-CM | POA: Insufficient documentation

## 2013-01-23 DIAGNOSIS — Y9367 Activity, basketball: Secondary | ICD-10-CM | POA: Insufficient documentation

## 2013-01-23 DIAGNOSIS — Z860100 Personal history of colon polyps, unspecified: Secondary | ICD-10-CM | POA: Insufficient documentation

## 2013-01-23 DIAGNOSIS — Y92838 Other recreation area as the place of occurrence of the external cause: Secondary | ICD-10-CM | POA: Insufficient documentation

## 2013-01-23 DIAGNOSIS — W219XXA Striking against or struck by unspecified sports equipment, initial encounter: Secondary | ICD-10-CM | POA: Insufficient documentation

## 2013-01-23 NOTE — ED Notes (Signed)
Elbowed when playing basketball , lac to lip

## 2013-01-24 MED ORDER — IBUPROFEN 800 MG PO TABS
800.0000 mg | ORAL_TABLET | Freq: Once | ORAL | Status: AC
  Administered 2013-01-24: 800 mg via ORAL
  Filled 2013-01-24: qty 1

## 2013-01-24 NOTE — ED Provider Notes (Signed)
History     CSN: [id number]  Arrival date & time 01/23/13  2118   First MD Initiated Contact with Patient 01/24/13 0119      Chief Complaint  Patient presents with  . Lip Laceration    (Consider location/radiation/quality/duration/timing/severity/associated sxs/prior treatment) HPI HPI Comments: Frank Hansen is a 22 y.o. male who presents to the Emergency Department complaining of laceration to the corner of his lip from taking an elbow to the face during a basketball game. Laceration is in the corner of his mouth and will not require repair at this time.    Past Medical History  Diagnosis Date  . Medial meniscus tear     needs surgery, Dr. Romeo Apple to perform in near future.   . Stomach problems     related to NSAID use  . Hx of adenomatous colonic polyps 09/2011    Surveillance colonoscopy due 09/2016    Past Surgical History  Procedure Laterality Date  . Acl graft and medial meniscus repair  12/12/10    left, needs repeat surgery for medial meniscus tear in near future, graft intact  . Colonoscopy  09/2011    NUU:VOZDGU rectal polyp-tubular adenoma. Normal TI. Next TCS in 09/2016.  Marland Kitchen Esophagogastroduodenoscopy  09/2011    YQI:HKVQQ hiatal hernia otherwise negative exam.     Family History  Problem Relation Age of Onset  . Diabetes    . Colon cancer Neg Hx     History  Substance Use Topics  . Smoking status: Never Smoker   . Smokeless tobacco: Not on file  . Alcohol Use: No      Review of Systems  Constitutional: Negative for fever.       10 Systems reviewed and are negative for acute change except as noted in the HPI.  HENT: Negative for congestion.   Eyes: Negative for discharge and redness.  Respiratory: Negative for cough and shortness of breath.   Cardiovascular: Negative for chest pain.  Gastrointestinal: Negative for vomiting and abdominal pain.  Musculoskeletal: Negative for back pain.  Skin: Negative for rash.       Lip laceration   Neurological: Negative for syncope, numbness and headaches.  Psychiatric/Behavioral:       No behavior change.    Allergies  Review of patient's allergies indicates no known allergies.  Home Medications  No current outpatient prescriptions on file.  BP 132/87  Pulse 94  Temp(Src) 99.1 F (37.3 C) (Oral)  Resp 24  Ht 5\' 6"  (1.676 m)  Wt 161 lb (73.029 kg)  BMI 26 kg/m2  SpO2 100%  Physical Exam  Nursing note and vitals reviewed. Constitutional: He appears well-developed and well-nourished.  Awake, alert, nontoxic appearance.  HENT:  Head: Normocephalic.  laceration to left corner of his mouth  Eyes: EOM are normal. Pupils are equal, round, and reactive to light.  Neck: Neck supple.  Cardiovascular: Normal rate and intact distal pulses.   Pulmonary/Chest: Effort normal and breath sounds normal. He exhibits no tenderness.  Abdominal: Soft. Bowel sounds are normal. There is no tenderness. There is no rebound.  Musculoskeletal: He exhibits no tenderness.  Baseline ROM, no obvious new focal weakness.  Neurological: A cranial nerve deficit is present.  Mental status and motor strength appears baseline for patient and situation.  Skin: No rash noted.  Psychiatric: He has a normal mood and affect.    ED Course  Procedures (including critical care time)  Medications  ibuprofen (ADVIL,MOTRIN) tablet 800 mg (not administered)  MDM  Patient presents with a laceration to the left corner of his mouth. Bleeding is controlled. He has had ice on it. No repair is necessary. Pt stable in ED with no significant deterioration in condition.The patient appears reasonably screened and/or stabilized for discharge and I doubt any other medical condition or other [facility] requiring further screening, evaluation, or treatment in the ED at this time prior to discharge.  MDM Reviewed: nursing note and vitals           Nicoletta Dress. Colon Branch, MD 01/24/13 Earle Gell

## 2013-05-16 ENCOUNTER — Emergency Department (HOSPITAL_COMMUNITY)
Admission: EM | Admit: 2013-05-16 | Discharge: 2013-05-16 | Disposition: A | Discharge status: Home / Self Care | Payer: BC Managed Care – PPO | Attending: Emergency Medicine | Admitting: Emergency Medicine

## 2013-05-16 ENCOUNTER — Encounter (HOSPITAL_COMMUNITY): Payer: Self-pay

## 2013-05-16 DIAGNOSIS — L0390 Cellulitis, unspecified: Secondary | ICD-10-CM

## 2013-05-16 DIAGNOSIS — Z87828 Personal history of other (healed) physical injury and trauma: Secondary | ICD-10-CM | POA: Insufficient documentation

## 2013-05-16 DIAGNOSIS — Z8719 Personal history of other diseases of the digestive system: Secondary | ICD-10-CM | POA: Insufficient documentation

## 2013-05-16 DIAGNOSIS — S80861A Insect bite (nonvenomous), right lower leg, initial encounter: Secondary | ICD-10-CM

## 2013-05-16 DIAGNOSIS — Z860100 Personal history of colon polyps, unspecified: Secondary | ICD-10-CM | POA: Insufficient documentation

## 2013-05-16 DIAGNOSIS — L089 Local infection of the skin and subcutaneous tissue, unspecified: Secondary | ICD-10-CM | POA: Insufficient documentation

## 2013-05-16 DIAGNOSIS — Y929 Unspecified place or not applicable: Secondary | ICD-10-CM | POA: Insufficient documentation

## 2013-05-16 DIAGNOSIS — R110 Nausea: Secondary | ICD-10-CM | POA: Insufficient documentation

## 2013-05-16 DIAGNOSIS — S50861A Insect bite (nonvenomous) of right forearm, initial encounter: Secondary | ICD-10-CM

## 2013-05-16 DIAGNOSIS — Y9389 Activity, other specified: Secondary | ICD-10-CM | POA: Insufficient documentation

## 2013-05-16 MED ORDER — CLINDAMYCIN HCL 150 MG PO CAPS: 300.0000 mg | ORAL_CAPSULE | Freq: Three times a day (TID) | ORAL | Status: DC

## 2013-05-16 MED ORDER — DIPHENHYDRAMINE HCL 25 MG PO CAPS
25.0000 mg | ORAL_CAPSULE | Freq: Once | ORAL | Status: AC
  Administered 2013-05-16: 25 mg via ORAL
  Filled 2013-05-16: qty 1

## 2013-05-16 MED ORDER — CLINDAMYCIN HCL 150 MG PO CAPS
300.0000 mg | ORAL_CAPSULE | Freq: Once | ORAL | Status: AC
  Administered 2013-05-16: 300 mg via ORAL
  Filled 2013-05-16: qty 2

## 2013-05-16 NOTE — ED Notes (Signed)
I think something bit me on my right arm and right leg per pt. Both are swollen.

## 2013-05-16 NOTE — ED Provider Notes (Signed)
CSN: [id number]     Arrival date & time 05/16/13  2103 History   First MD Initiated Contact with Patient 05/16/13 2130     Chief Complaint  Patient presents with  . Insect Bite   (Consider location/radiation/quality/duration/timing/severity/associated sxs/prior Treatment) HPI Comments: Frank Hansen is a 22 y.o. male who presents to the Emergency Department complaining of redness, itching, pain and swelling of his right forearm and right lower leg.  States that he was bitten by some type of insect on the evening prior to ED arrival.  States the symptoms began as a "little red bump" and within several hours he developed swelling and redness.  He also c/o nausea but denies vomiting, abd pain, fever or chills.  Patient states he is not sure what type of insect it was nor did he actually  see any insect or spider.  The history is provided by the patient.    Past Medical History  Diagnosis Date  . Medial meniscus tear     needs surgery, Dr. Romeo Apple to perform in near future.   . Stomach problems     related to NSAID use  . Hx of adenomatous colonic polyps 09/2011    Surveillance colonoscopy due 09/2016   Past Surgical History  Procedure Laterality Date  . Acl graft and medial meniscus repair  12/12/10    left, needs repeat surgery for medial meniscus tear in near future, graft intact  . Colonoscopy  09/2011    WUJ:WJXBJY rectal polyp-tubular adenoma. Normal TI. Next TCS in 09/2016.  Marland Kitchen Esophagogastroduodenoscopy  09/2011    NWG:NFAOZ hiatal hernia otherwise negative exam.    Family History  Problem Relation Age of Onset  . Diabetes    . Colon cancer Neg Hx    History  Substance Use Topics  . Smoking status: Never Smoker   . Smokeless tobacco: Not on file  . Alcohol Use: No    Review of Systems  Constitutional: Negative for fever and chills.  Gastrointestinal: Negative for nausea and vomiting.  Musculoskeletal: Negative for joint swelling and arthralgias.  Skin: Positive for  color change and rash.       Red bump to the right forearm and lower leg with surrounding redness and swelling  Hematological: Negative for adenopathy.  All other systems reviewed and are negative.    Allergies  Review of patient's allergies indicates no known allergies.  Home Medications  No current outpatient prescriptions on file. BP 141/75  Temp(Src) 98.1 F (36.7 C) (Oral)  Resp 20  Ht 5\' 6"  (1.676 m)  Wt 170 lb (77.111 kg)  BMI 27.45 kg/m2  SpO2 100% Physical Exam  Nursing note and vitals reviewed. Constitutional: He is oriented to person, place, and time. He appears well-developed and well-nourished. No distress.  HENT:  Head: Normocephalic and atraumatic.  Neck: Normal range of motion. Neck supple.  Cardiovascular: Normal rate, regular rhythm and normal heart sounds.   No murmur heard. Pulmonary/Chest: Effort normal and breath sounds normal. No respiratory distress.  Musculoskeletal: He exhibits edema and tenderness.  Neurological: He is alert and oriented to person, place, and time. He exhibits normal muscle tone. Coordination normal.  Skin: Skin is warm and dry. There is erythema.  <1 cm erythematous papule to the mid right forearm and similar appearing papule to the anterior right lower leg.  Both has significiant surrounding erythema, warmth, and STS.  No pustules, vesicles, fluctuance or induration.  Pt has full ROM of the right wrist, elbow, knee and ankle.  Radial and DP pulses are brisk, distal sensation intact.  No lymphangitis    ED Course  Procedures (including critical care time) Labs Review Labs Reviewed - No data to display Imaging Review No results found.  MDM    Two erythematous papules with surrounding erythema and edema.  One is located on the right forearm and the other similar appearing papule to the anterior right lower leg.  No obvious abscess at this time.  Leading edge of the erythema was marked by me and pt advised to return here in 1-2  days for recheck  Will start clinda and pt agrees to elevate, bendaryl, and hydrocortisone cream.    Patient is well appearing, VSS.  He appears stable for discharge.  Breylin Dom L. Allicia Culley, PA-C 05/17/13 1140

## 2013-05-17 MED ORDER — BUPIVACAINE HCL (PF) 0.25 % IJ SOLN
INTRAMUSCULAR | Status: AC
  Filled 2013-05-17: qty 30

## 2013-05-18 NOTE — ED Provider Notes (Signed)
Medical screening examination/treatment/procedure(s) were performed by non-physician practitioner and as supervising physician I was immediately available for consultation/collaboration.  Aurore Redinger, MD 05/18/13 0632 

## 2013-06-19 ENCOUNTER — Telehealth: Payer: Self-pay | Admitting: *Deleted

## 2013-06-19 ENCOUNTER — Emergency Department (HOSPITAL_COMMUNITY): Payer: BC Managed Care – PPO

## 2013-06-19 ENCOUNTER — Encounter (HOSPITAL_COMMUNITY): Payer: Self-pay | Admitting: Emergency Medicine

## 2013-06-19 ENCOUNTER — Emergency Department (HOSPITAL_COMMUNITY)
Admission: EM | Admit: 2013-06-19 | Discharge: 2013-06-20 | Disposition: A | Discharge status: Home / Self Care | Payer: BC Managed Care – PPO | Attending: Emergency Medicine | Admitting: Emergency Medicine

## 2013-06-19 DIAGNOSIS — R1012 Left upper quadrant pain: Secondary | ICD-10-CM | POA: Insufficient documentation

## 2013-06-19 DIAGNOSIS — Z860100 Personal history of colon polyps, unspecified: Secondary | ICD-10-CM | POA: Insufficient documentation

## 2013-06-19 DIAGNOSIS — G8929 Other chronic pain: Secondary | ICD-10-CM | POA: Insufficient documentation

## 2013-06-19 DIAGNOSIS — R109 Unspecified abdominal pain: Secondary | ICD-10-CM

## 2013-06-19 DIAGNOSIS — R1013 Epigastric pain: Secondary | ICD-10-CM | POA: Insufficient documentation

## 2013-06-19 DIAGNOSIS — Z87828 Personal history of other (healed) physical injury and trauma: Secondary | ICD-10-CM | POA: Insufficient documentation

## 2013-06-19 HISTORY — DX: Unspecified abdominal pain: R10.9

## 2013-06-19 HISTORY — DX: Diaphragmatic hernia without obstruction or gangrene: K44.9

## 2013-06-19 HISTORY — DX: Other chronic pain: G89.29

## 2013-06-19 LAB — COMPREHENSIVE METABOLIC PANEL
ALT: 15 U/L (ref 0–53)
AST: 19 U/L (ref 0–37)
Albumin: 3.6 g/dL (ref 3.5–5.2)
Alkaline Phosphatase: 79 U/L (ref 39–117)
Calcium: 9.3 mg/dL (ref 8.4–10.5)
Chloride: 101 mEq/L (ref 96–112)
GFR calc Af Amer: >90 mL/min (ref >90)
Glucose, Bld: 101 mg/dL — ABNORMAL HIGH (ref 70–99)
Potassium: 3.7 mEq/L (ref 3.5–5.1)
Sodium: 136 mEq/L (ref 135–145)
Total Protein: 7.2 g/dL (ref 6.0–8.3)

## 2013-06-19 LAB — CBC WITH DIFFERENTIAL/PLATELET
Basophils Absolute: 0 10*3/uL (ref 0.0–0.1)
Basophils Relative: 1 % (ref 0–1)
Eosinophils Absolute: 0.2 10*3/uL (ref 0.0–0.7)
Eosinophils Relative: 3 % (ref 0–5)
Hemoglobin: 13.7 g/dL (ref 13.0–17.0)
Lymphs Abs: 2.6 10*3/uL (ref 0.7–4.0)
MCH: 31.9 pg (ref 26.0–34.0)
Neutro Abs: 3.3 10*3/uL (ref 1.7–7.7)
Neutrophils Relative %: 50 % (ref 43–77)
Platelets: 235 10*3/uL (ref 150–400)
RBC: 4.29 MIL/uL (ref 4.22–5.81)
RDW: 12.8 % (ref 11.5–15.5)

## 2013-06-19 MED ORDER — GI COCKTAIL ~~LOC~~
30.0000 mL | Freq: Once | ORAL | Status: AC
  Administered 2013-06-19: 30 mL via ORAL
  Filled 2013-06-19: qty 30

## 2013-06-19 MED ORDER — FAMOTIDINE 20 MG PO TABS
40.0000 mg | ORAL_TABLET | Freq: Once | ORAL | Status: AC
  Administered 2013-06-19: 40 mg via ORAL
  Filled 2013-06-19: qty 2

## 2013-06-19 NOTE — ED Notes (Signed)
abd pain for 2 weeks.  Says he is out of meds for his stomach.

## 2013-06-19 NOTE — Telephone Encounter (Signed)
Routing to [employer]. This is RMR pt.

## 2013-06-19 NOTE — ED Notes (Signed)
Patient denies any n/v/d or urinary symptoms

## 2013-06-19 NOTE — Telephone Encounter (Signed)
Pt's girlfriend called and made pt appt. For 07/10/13 with Tana Coast, states pt has not been seen here in a while, pt is having some abd. Pain and a little blood in stools, pt thinks it is because he ran out of his medication. Pt didn't not want a quick appt. Because he needed time to get off work. Please advise [phone]

## 2013-06-19 NOTE — ED Provider Notes (Signed)
CSN: [id number]     Arrival date & time 06/19/13  2155 History   First MD Initiated Contact with Patient 06/19/13 2303     Chief Complaint  Patient presents with  . Abdominal Pain   HPI Pt was seen at 2310.  Per pt, c/o gradual onset and persistence of constant acute flair of his chronic upper abd "pain" for the past 6 months, worse over the past 2 weeks. Describes the abd pain as per his usual chronic pain pattern for the past several years. States his symptoms began shortly after ran out of his "stomach meds" (omeprazole) approx 6 months ago. Denies N/V/D, no fevers, no back pain, no rash, no CP/SOB, no black or blood in stools.     GI: Dr. Kendell Bane Past Medical History  Diagnosis Date  . Medial meniscus tear     needs surgery, Dr. Romeo Apple to perform in near future.   . Stomach problems     related to NSAID use  . Hx of adenomatous colonic polyps 09/2011    Surveillance colonoscopy due 09/2016  . Chronic abdominal pain   . Hiatal hernia    Past Surgical History  Procedure Laterality Date  . Acl graft and medial meniscus repair  12/12/10    left, needs repeat surgery for medial meniscus tear in near future, graft intact  . Colonoscopy  09/2011    WUJ:WJXBJY rectal polyp-tubular adenoma. Normal TI. Next TCS in 09/2016.  Marland Kitchen Esophagogastroduodenoscopy  09/2011    NWG:NFAOZ hiatal hernia otherwise negative exam.    Family History  Problem Relation Age of Onset  . Diabetes    . Colon cancer Neg Hx    History  Substance Use Topics  . Smoking status: Never Smoker   . Smokeless tobacco: Not on file  . Alcohol Use: No    Review of Systems ROS: Statement: All systems negative except as marked or noted in the HPI; Constitutional: Negative for fever and chills. ; ; Eyes: Negative for eye pain, redness and discharge. ; ; ENMT: Negative for ear pain, hoarseness, nasal congestion, sinus pressure and sore throat. ; ; Cardiovascular: Negative for chest pain, palpitations, diaphoresis, dyspnea and  peripheral edema. ; ; Respiratory: Negative for cough, wheezing and stridor. ; ; Gastrointestinal: +abd pain. Negative for nausea, vomiting, diarrhea, blood in stool, hematemesis, jaundice and rectal bleeding. . ; ; Genitourinary: Negative for dysuria, flank pain and hematuria. ; ; Musculoskeletal: Negative for back pain and neck pain. Negative for swelling and trauma.; ; Skin: Negative for pruritus, rash, abrasions, blisters, bruising and skin lesion.; ; Neuro: Negative for headache, lightheadedness and neck stiffness. Negative for weakness, altered level of consciousness , altered mental status, extremity weakness, paresthesias, involuntary movement, seizure and syncope.       Allergies  Review of patient's allergies indicates no known allergies.  Home Medications  No current outpatient prescriptions on file. BP 145/61  Pulse 69  Temp(Src) 98.5 F (36.9 C) (Oral)  Resp 18  Ht 5\' 5"  (1.651 m)  Wt 187 lb (84.823 kg)  BMI 31.12 kg/m2  SpO2 97% Physical Exam 2315: Physical examination:  Nursing notes reviewed; Vital signs and O2 SAT reviewed;  Constitutional: Well developed, Well nourished, Well hydrated, In no acute distress. Sleeping soundly on my arrival to ED exam room.; Head:  Normocephalic, atraumatic; Eyes: EOMI, PERRL, No scleral icterus; ENMT: Mouth and pharynx normal, Mucous membranes moist; Neck: Supple, Full range of motion, No lymphadenopathy; Cardiovascular: Regular rate and rhythm, No murmur, rub, or gallop;  Respiratory: Breath sounds clear & equal bilaterally, No rales, rhonchi, wheezes.  Speaking full sentences with ease, Normal respiratory effort/excursion; Chest: Nontender, Movement normal; Abdomen: Soft, +RUQ, mid-epigastric, LUQ tenderness to palp. No rebound or guarding. Nondistended, Normal bowel sounds; Genitourinary: No CVA tenderness; Extremities: Pulses normal, No tenderness, No edema, No calf edema or asymmetry.; Neuro: AA&Ox3, Major CN grossly intact.  Speech clear. No  gross focal motor or sensory deficits in extremities.; Skin: Color normal, Warm, Dry.    ED Course  Procedures    EKG Interpretation   None       MDM  MDM Reviewed: previous chart, nursing note and vitals Reviewed previous: ultrasound and labs Interpretation: labs and x-ray     Results for orders placed during the hospital encounter of 06/19/13  URINALYSIS W MICROSCOPIC + REFLEX CULTURE      Result Value Range   Color, Urine YELLOW  YELLOW   APPearance CLEAR  CLEAR   Specific Gravity, Urine 1.025  1.005 - 1.030   pH 6.5  5.0 - 8.0   Glucose, UA NEGATIVE  NEGATIVE mg/dL   Hgb urine dipstick NEGATIVE  NEGATIVE   Bilirubin Urine NEGATIVE  NEGATIVE   Ketones, ur NEGATIVE  NEGATIVE mg/dL   Protein, ur NEGATIVE  NEGATIVE mg/dL   Urobilinogen, UA 0.2  0.0 - 1.0 mg/dL   Nitrite NEGATIVE  NEGATIVE   Leukocytes, UA NEGATIVE  NEGATIVE   WBC, UA 0-2  <3 WBC/hpf   RBC / HPF 0-2  <3 RBC/hpf   Bacteria, UA MANY (*) RARE   Squamous Epithelial / LPF RARE  RARE  CBC WITH DIFFERENTIAL      Result Value Range   WBC 6.6  4.0 - 10.5 K/uL   RBC 4.29  4.22 - 5.81 MIL/uL   Hemoglobin 13.7  13.0 - 17.0 g/dL   HCT 34.7  42.5 - 95.6 %   MCV 93.2  78.0 - 100.0 fL   MCH 31.9  26.0 - 34.0 pg   MCHC 34.3  30.0 - 36.0 g/dL   RDW 38.7  56.4 - 33.2 %   Platelets 235  150 - 400 K/uL   Neutrophils Relative % 50  43 - 77 %   Neutro Abs 3.3  1.7 - 7.7 K/uL   Lymphocytes Relative 40  12 - 46 %   Lymphs Abs 2.6  0.7 - 4.0 K/uL   Monocytes Relative 6  3 - 12 %   Monocytes Absolute 0.4  0.1 - 1.0 K/uL   Eosinophils Relative 3  0 - 5 %   Eosinophils Absolute 0.2  0.0 - 0.7 K/uL   Basophils Relative 1  0 - 1 %   Basophils Absolute 0.0  0.0 - 0.1 K/uL  COMPREHENSIVE METABOLIC PANEL      Result Value Range   Sodium 136  135 - 145 mEq/L   Potassium 3.7  3.5 - 5.1 mEq/L   Chloride 101  96 - 112 mEq/L   CO2 27  19 - 32 mEq/L   Glucose, Bld 101 (*) 70 - 99 mg/dL   BUN 16  6 - 23 mg/dL    Creatinine, Ser 9.51  0.50 - 1.35 mg/dL   Calcium 9.3  8.4 - 88.4 mg/dL   Total Protein 7.2  6.0 - 8.3 g/dL   Albumin 3.6  3.5 - 5.2 g/dL   AST 19  0 - 37 U/L   ALT 15  0 - 53 U/L   Alkaline Phosphatase 79  39 -  117 U/L   Total Bilirubin 0.2 (*) 0.3 - 1.2 mg/dL   GFR calc non Af Amer 90 (*) >90 mL/min   GFR calc Af Amer >90  >90 mL/min  LIPASE, BLOOD      Result Value Range   Lipase 46  11 - 59 U/L   Dg Abd Acute W/chest 06/20/2013   CLINICAL DATA:  Abdominal pain for 2 weeks, history of hiatal hernia and colonic polyps.  EXAM: ACUTE ABDOMEN SERIES (ABDOMEN 2 VIEW & CHEST 1 VIEW)  COMPARISON:  Abdominal ultrasound November 28, 2012.  FINDINGS: There is no evidence of dilated bowel loops or free intraperitoneal air. No radiopaque calculi or other significant radiographic abnormality is seen. Heart size and mediastinal contours are within normal limits. Both lungs are clear. Moderate pubic symphyseal osteoarthrosis.  IMPRESSION: Nonspecific bowel gas pattern.  No acute cardiopulmonary disease.   Electronically Signed   By: Awilda Metro   On: 06/20/2013 00:05    0110:  Pt has tol PO well while in the ED without N/V.  No stooling while in the ED.  Abd benign, VSS. Feels better after meds and wants to go home now. Laying on stretcher watching TV without distress. Strongly encouraged to f/u with GI MD for good continuity of care and control of his chronic abd pain. Dx and testing d/w pt and family.  Questions answered.  Verb understanding, agreeable to d/c home with outpt f/u.    Laray Anger, DO 06/22/13 2024

## 2013-06-20 LAB — URINALYSIS W MICROSCOPIC + REFLEX CULTURE
Bilirubin Urine: NEGATIVE
Glucose, UA: NEGATIVE mg/dL
Hgb urine dipstick: NEGATIVE
Ketones, ur: NEGATIVE mg/dL
Nitrite: NEGATIVE
Specific Gravity, Urine: 1.025 (ref 1.005–1.030)
pH: 6.5 (ref 5.0–8.0)

## 2013-06-20 MED ORDER — OXYCODONE-ACETAMINOPHEN 5-325 MG PO TABS
1.0000 | ORAL_TABLET | Freq: Once | ORAL | Status: AC
  Administered 2013-06-20: 1 via ORAL
  Filled 2013-06-20: qty 1

## 2013-06-20 NOTE — Telephone Encounter (Signed)
Pt went to ED yesterday

## 2013-06-22 ENCOUNTER — Ambulatory Visit (HOSPITAL_COMMUNITY)
Admission: RE | Admit: 2013-06-22 | Discharge: 2013-06-22 | Disposition: A | Discharge status: Home / Self Care | Payer: BC Managed Care – PPO | Source: Ambulatory Visit | Attending: Gastroenterology | Admitting: Gastroenterology

## 2013-06-22 ENCOUNTER — Ambulatory Visit (INDEPENDENT_AMBULATORY_CARE_PROVIDER_SITE_OTHER): Payer: BC Managed Care – PPO | Admitting: Gastroenterology

## 2013-06-22 ENCOUNTER — Ambulatory Visit (INDEPENDENT_AMBULATORY_CARE_PROVIDER_SITE_OTHER): Payer: BC Managed Care – PPO | Admitting: Nurse Practitioner

## 2013-06-22 ENCOUNTER — Encounter: Payer: Self-pay | Admitting: Gastroenterology

## 2013-06-22 ENCOUNTER — Encounter: Payer: Self-pay | Admitting: Nurse Practitioner

## 2013-06-22 ENCOUNTER — Encounter (INDEPENDENT_AMBULATORY_CARE_PROVIDER_SITE_OTHER): Payer: Self-pay

## 2013-06-22 VITALS — BP 118/80 | Temp 97.5°F | Ht 67.0 in | Wt 187.6 lb

## 2013-06-22 VITALS — BP 132/75 | HR 51 | Temp 97.8°F | Wt 183.2 lb

## 2013-06-22 DIAGNOSIS — Z860100 Personal history of colon polyps, unspecified: Secondary | ICD-10-CM

## 2013-06-22 DIAGNOSIS — R1013 Epigastric pain: Secondary | ICD-10-CM

## 2013-06-22 DIAGNOSIS — R109 Unspecified abdominal pain: Secondary | ICD-10-CM

## 2013-06-22 DIAGNOSIS — K625 Hemorrhage of anus and rectum: Secondary | ICD-10-CM

## 2013-06-22 DIAGNOSIS — R1031 Right lower quadrant pain: Secondary | ICD-10-CM

## 2013-06-22 DIAGNOSIS — R933 Abnormal findings on diagnostic imaging of other parts of digestive tract: Secondary | ICD-10-CM | POA: Insufficient documentation

## 2013-06-22 DIAGNOSIS — R12 Heartburn: Secondary | ICD-10-CM

## 2013-06-22 DIAGNOSIS — K921 Melena: Secondary | ICD-10-CM

## 2013-06-22 MED ORDER — PANTOPRAZOLE SODIUM 40 MG PO TBEC: 40.0000 mg | DELAYED_RELEASE_TABLET | Freq: Every day | ORAL | Status: DC

## 2013-06-22 MED ORDER — IOHEXOL 300 MG/ML  SOLN
100.0000 mL | Freq: Once | INTRAMUSCULAR | Status: AC | PRN
  Administered 2013-06-22: 100 mL via INTRAVENOUS

## 2013-06-22 MED ORDER — DICYCLOMINE HCL 20 MG PO TABS: ORAL_TABLET | ORAL | Status: DC

## 2013-06-22 NOTE — Assessment & Plan Note (Signed)
22 year old gentleman with recurrent abdominal pain previously epigastric only but now complains of right lower quadrant pain. Pain occurs with or without food. Denies any bowel change. No fever or chills. Weight has increased since last office visit. Came off his medications over 6 months ago and has been having problems since that time but symptoms worsened last 2 weeks. Given the right lower quadrant pain, recommend CT abdomen and pelvis with contrast to further evaluate.  Rectal bleeding may be due to benign anorectal source but given history of rectal tubular adenoma removed last year, we'll discuss further with Dr. Jena Gauss.

## 2013-06-22 NOTE — Patient Instructions (Signed)
1. Please start the Bentyl and Protonix that your PCP provided you today. 2. CT scan of your abdomen today.

## 2013-06-22 NOTE — Progress Notes (Signed)
Primary Care Physician: LUKING,W S, MD  Primary Gastroenterologist:  Michael Rourk, MD   Chief Complaint  Patient presents with  . Abdominal Pain    HPI: Frank Hansen is a 22 y.o. male here for urgent office visit at the request of PCP. Seen in the PCPs office today. Last seen here in April 2014. Complains of recurrent abdominal pain as before. Initially evaluated in February 2013 for the same. EGD and colonoscopy as outlined below. Abdominal ultrasound and HIDA scan in April were unremarkable.  States he always has abdominal pain however the past 2 weeks the pain has worsened. Pain is at the epigastrium as well as in the lower abdomen. Complains of nausea but no vomiting. Intermittent heartburn. Has 2-3 bowel movements daily. Small-volume toilet paper hematochezia with every bowel movement. Denies fever. Denies genitourinary symptoms. Weight up 15 pounds since 11/2012.  Came off PPI shortly after his last visit in April. Given prescription for dicyclomine and pantoprazole today by his PCP.   Seen in the emergency department on 06/19/2013 for abdominal pain. Labs and abdominal films as outlined below.   Current Outpatient Prescriptions  Medication Sig Dispense Refill  . dicyclomine (BENTYL) 20 MG tablet One po QID prn abd spasms  40 tablet  0  . pantoprazole (PROTONIX) 40 MG tablet Take 1 tablet (40 mg total) by mouth daily. Prn acid reflux  30 tablet  2   No current facility-administered medications for this visit.    Allergies as of 06/22/2013  . (No Known Allergies)   Past Medical History  Diagnosis Date  . Medial meniscus tear     needs surgery, Dr. Harrison to perform in near future.   . Stomach problems     related to NSAID use  . Hx of adenomatous colonic polyps 09/2011    Surveillance colonoscopy due 09/2016  . Chronic abdominal pain   . Hiatal hernia    Past Surgical History  Procedure Laterality Date  . Acl graft and medial meniscus repair  12/12/10    left, needs  repeat surgery for medial meniscus tear in near future, graft intact  . Colonoscopy  09/2011    RMR:Single rectal polyp-tubular adenoma. Normal TI. Next TCS in 09/2016.  . Esophagogastroduodenoscopy  09/2011    RMR:Small hiatal hernia otherwise negative exam.     ROS:  General: Negative for anorexia, weight loss, fever, chills, fatigue, weakness. ENT: Negative for hoarseness, difficulty swallowing , nasal congestion. CV: Negative for chest pain, angina, palpitations, dyspnea on exertion, peripheral edema.  Respiratory: Negative for dyspnea at rest, dyspnea on exertion, cough, sputum, wheezing.  GI: See history of present illness. GU:  Negative for dysuria, hematuria, urinary incontinence, urinary frequency, nocturnal urination.  Endo: Negative for unusual weight change.    Physical Examination:   BP 132/75  Pulse 51  Temp(Src) 97.8 F (36.6 C) (Oral)  Wt 183 lb 3.2 oz (83.099 kg)  BMI 28.69 kg/m2  General: Well-nourished, well-developed in no acute distress. Accompanied by girlfriend. Eyes: No icterus. Mouth: Oropharyngeal mucosa moist and pink , no lesions erythema or exudate. Lungs: Clear to auscultation bilaterally.  Heart: Regular rate and rhythm, no murmurs rubs or gallops.  Abdomen: Bowel sounds are normal, moderate tenderness in the epigastrium and right lower quadrant.  nondistended, no hepatosplenomegaly or masses, no abdominal bruits or hernia , no rebound or guarding.   Extremities: No lower extremity edema. No clubbing or deformities. Neuro: Alert and oriented x 4   Skin: Warm and dry, no jaundice.     Psych: Alert and cooperative, normal mood and affect.  Labs:  Lab Results  Component Value Date   WBC 6.6 06/19/2013   HGB 13.7 06/19/2013   HCT 40.0 06/19/2013   MCV 93.2 06/19/2013   PLT 235 06/19/2013   Lab Results  Component Value Date   CREATININE 1.15 06/19/2013   BUN 16 06/19/2013   NA 136 06/19/2013   K 3.7 06/19/2013   CL 101 06/19/2013   CO2 27  06/19/2013   Lab Results  Component Value Date   ALT 15 06/19/2013   AST 19 06/19/2013   ALKPHOS 79 06/19/2013   BILITOT 0.2* 06/19/2013   Lab Results  Component Value Date   LIPASE 46 06/19/2013    Imaging Studies: Dg Abd Acute W/chest  06/20/2013   CLINICAL DATA:  Abdominal pain for 2 weeks, history of hiatal hernia and colonic polyps.  EXAM: ACUTE ABDOMEN SERIES (ABDOMEN 2 VIEW & CHEST 1 VIEW)  COMPARISON:  Abdominal ultrasound November 28, 2012.  FINDINGS: There is no evidence of dilated bowel loops or free intraperitoneal air. No radiopaque calculi or other significant radiographic abnormality is seen. Heart size and mediastinal contours are within normal limits. Both lungs are clear. Moderate pubic symphyseal osteoarthrosis.  IMPRESSION: Nonspecific bowel gas pattern.  No acute cardiopulmonary disease.   Electronically Signed   By: Courtnay  Bloomer   On: 06/20/2013 00:05        

## 2013-06-25 ENCOUNTER — Encounter: Payer: Self-pay | Admitting: Nurse Practitioner

## 2013-06-25 NOTE — Assessment & Plan Note (Signed)
Our referral coordinator contacted GI specialist, sent for evaluation this AM. 

## 2013-06-25 NOTE — Progress Notes (Signed)
Subjective:  Presents complaints of generalized lower abdominal pain described as stabbing or sharp off-and-on for the past 2 weeks. Occurs every day sometimes all day. Worse with spicy foods or greasy foods. Thinks it may also be worse with milk products. Has had a complete GI workup which was normal. Slight constipation. No diarrhea. Occasional bright red blood with wiping after BM. No rectal itching burning or pain. Has a history of hematochezia. No fever. No urinary symptoms. Here today for followup after recent ER visit. Has routine appointment with GI specialist on 11/17.  Objective:   BP 118/80  Temp(Src) 97.5 F (36.4 C)  Ht 5\' 7"  (1.702 m)  Wt 187 lb 9.6 oz (85.095 kg)  BMI 29.38 kg/m2 NAD. Alert, oriented. Lungs clear. Heart regular rate rhythm. Abdomen soft nondistended with mild epigastric area tenderness, generalized lower abdominal tenderness more towards right lower quadrant. No rebound or guarding. No obvious masses. See labs 10/27.  Assessment:Abdominal pain, unspecified site  Hematochezia  Plan: Meds ordered this encounter  Medications  . dicyclomine (BENTYL) 20 MG tablet    Sig: One po QID prn abd spasms    Dispense:  40 tablet    Refill:  0    Order Specific Question:  Supervising Provider    Answer:  Merlyn Albert [[id number]]  . pantoprazole (PROTONIX) 40 MG tablet    Sig: Take 1 tablet (40 mg total) by mouth daily. Prn acid reflux    Dispense:  30 tablet    Refill:  2    Order Specific Question:  Supervising Provider    Answer:  Riccardo Dubin   Our referral coordinator contacted GI specialist, sent for evaluation this AM.

## 2013-06-25 NOTE — Assessment & Plan Note (Signed)
Our referral coordinator contacted GI specialist, sent for evaluation this AM.

## 2013-06-26 NOTE — Progress Notes (Signed)
cc'd to pcp 

## 2013-06-27 ENCOUNTER — Telehealth: Payer: Self-pay | Admitting: Internal Medicine

## 2013-06-27 NOTE — Progress Notes (Signed)
Quick Note:  CT showed normal appendix. His rectal wall appears thick with some subtle stranding.  I discuss with Dr. Jena Gauss. He recommends flex sig with possible conscious sedation to evaluate this finding. ?evolving IBD? Needs Miralax prep and enema morning of procedure.  Tell patient to resume protonix. ______

## 2013-06-27 NOTE — Telephone Encounter (Signed)
Routing to [facility] 

## 2013-06-27 NOTE — Telephone Encounter (Signed)
See result note.  

## 2013-06-27 NOTE — Telephone Encounter (Signed)
Tried to call Frank Hansen 

## 2013-06-27 NOTE — Telephone Encounter (Signed)
Pt calling to see if his results from his CT are back yet. [phone]

## 2013-06-27 NOTE — Telephone Encounter (Signed)
Spoke with pt

## 2013-06-28 NOTE — Progress Notes (Signed)
Quick Note:  Just schedule him for the procedure. I just saw him in the office. He does not have upcoming OV because we worked him in! ______

## 2013-06-29 ENCOUNTER — Other Ambulatory Visit: Payer: Self-pay | Admitting: Internal Medicine

## 2013-06-29 DIAGNOSIS — K625 Hemorrhage of anus and rectum: Secondary | ICD-10-CM

## 2013-06-29 DIAGNOSIS — R1032 Left lower quadrant pain: Secondary | ICD-10-CM

## 2013-07-04 ENCOUNTER — Encounter (HOSPITAL_COMMUNITY): Payer: Self-pay | Admitting: Pharmacy Technician

## 2013-07-10 ENCOUNTER — Ambulatory Visit: Payer: BC Managed Care – PPO | Admitting: Gastroenterology

## 2013-07-13 ENCOUNTER — Encounter (HOSPITAL_COMMUNITY): Payer: Self-pay | Admitting: *Deleted

## 2013-07-13 ENCOUNTER — Ambulatory Visit (HOSPITAL_COMMUNITY)
Admission: RE | Admit: 2013-07-13 | Discharge: 2013-07-13 | Disposition: A | Discharge status: Home / Self Care | Payer: BC Managed Care – PPO | Source: Ambulatory Visit | Attending: Internal Medicine | Admitting: Internal Medicine

## 2013-07-13 ENCOUNTER — Encounter (HOSPITAL_COMMUNITY)
Admission: RE | Disposition: A | Discharge status: Home / Self Care | Payer: Self-pay | Source: Ambulatory Visit | Attending: Internal Medicine

## 2013-07-13 DIAGNOSIS — R933 Abnormal findings on diagnostic imaging of other parts of digestive tract: Secondary | ICD-10-CM

## 2013-07-13 DIAGNOSIS — K921 Melena: Secondary | ICD-10-CM

## 2013-07-13 DIAGNOSIS — K625 Hemorrhage of anus and rectum: Secondary | ICD-10-CM

## 2013-07-13 DIAGNOSIS — R1032 Left lower quadrant pain: Secondary | ICD-10-CM

## 2013-07-13 DIAGNOSIS — R109 Unspecified abdominal pain: Secondary | ICD-10-CM

## 2013-07-13 HISTORY — PX: FLEXIBLE SIGMOIDOSCOPY: SHX5431

## 2013-07-13 SURGERY — SIGMOIDOSCOPY, FLEXIBLE
Anesthesia: Moderate Sedation

## 2013-07-13 MED ORDER — MIDAZOLAM HCL 5 MG/5ML IJ SOLN
INTRAMUSCULAR | Status: AC
  Filled 2013-07-13: qty 10

## 2013-07-13 MED ORDER — MEPERIDINE HCL 100 MG/ML IJ SOLN
INTRAMUSCULAR | Status: AC
  Filled 2013-07-13: qty 2

## 2013-07-13 MED ORDER — MEPERIDINE HCL 100 MG/ML IJ SOLN
INTRAMUSCULAR | Status: DC | PRN
  Administered 2013-07-13: 25 mg via INTRAVENOUS
  Administered 2013-07-13: 50 mg via INTRAVENOUS

## 2013-07-13 MED ORDER — MIDAZOLAM HCL 5 MG/5ML IJ SOLN
INTRAMUSCULAR | Status: DC | PRN
  Administered 2013-07-13 (×2): 2 mg via INTRAVENOUS

## 2013-07-13 MED ORDER — ONDANSETRON HCL 4 MG/2ML IJ SOLN
INTRAMUSCULAR | Status: DC | PRN
  Administered 2013-07-13: 4 mg via INTRAVENOUS

## 2013-07-13 MED ORDER — STERILE WATER FOR IRRIGATION IR SOLN
Status: DC | PRN
  Administered 2013-07-13: 12:00:00

## 2013-07-13 MED ORDER — SODIUM CHLORIDE 0.9 % IV SOLN
INTRAVENOUS | Status: DC
  Administered 2013-07-13: 10:00:00 via INTRAVENOUS

## 2013-07-13 MED ORDER — ONDANSETRON HCL 4 MG/2ML IJ SOLN
INTRAMUSCULAR | Status: AC
  Filled 2013-07-13: qty 2

## 2013-07-13 NOTE — H&P (View-Only) (Signed)
Primary Care Physician: Harlow Asa, MD  Primary Gastroenterologist:  Roetta Sessions, MD   Chief Complaint  Patient presents with  . Abdominal Pain    HPI: Frank Hansen is a 22 y.o. male here for urgent office visit at the request of PCP. Seen in the PCPs office today. Last seen here in April 2014. Complains of recurrent abdominal pain as before. Initially evaluated in February 2013 for the same. EGD and colonoscopy as outlined below. Abdominal ultrasound and HIDA scan in April were unremarkable.  States he always has abdominal pain however the past 2 weeks the pain has worsened. Pain is at the epigastrium as well as in the lower abdomen. Complains of nausea but no vomiting. Intermittent heartburn. Has 2-3 bowel movements daily. Small-volume toilet paper hematochezia with every bowel movement. Denies fever. Denies genitourinary symptoms. Weight up 15 pounds since 11/2012.  Came off PPI shortly after his last visit in April. Given prescription for dicyclomine and pantoprazole today by his PCP.   Seen in the emergency department on 06/19/2013 for abdominal pain. Labs and abdominal films as outlined below.   Current Outpatient Prescriptions  Medication Sig Dispense Refill  . dicyclomine (BENTYL) 20 MG tablet One po QID prn abd spasms  40 tablet  0  . pantoprazole (PROTONIX) 40 MG tablet Take 1 tablet (40 mg total) by mouth daily. Prn acid reflux  30 tablet  2   No current facility-administered medications for this visit.    Allergies as of 06/22/2013  . (No Known Allergies)   Past Medical History  Diagnosis Date  . Medial meniscus tear     needs surgery, Dr. Romeo Apple to perform in near future.   . Stomach problems     related to NSAID use  . Hx of adenomatous colonic polyps 09/2011    Surveillance colonoscopy due 09/2016  . Chronic abdominal pain   . Hiatal hernia    Past Surgical History  Procedure Laterality Date  . Acl graft and medial meniscus repair  12/12/10    left, needs  repeat surgery for medial meniscus tear in near future, graft intact  . Colonoscopy  09/2011    WUJ:WJXBJY rectal polyp-tubular adenoma. Normal TI. Next TCS in 09/2016.  Marland Kitchen Esophagogastroduodenoscopy  09/2011    NWG:NFAOZ hiatal hernia otherwise negative exam.     ROS:  General: Negative for anorexia, weight loss, fever, chills, fatigue, weakness. ENT: Negative for hoarseness, difficulty swallowing , nasal congestion. CV: Negative for chest pain, angina, palpitations, dyspnea on exertion, peripheral edema.  Respiratory: Negative for dyspnea at rest, dyspnea on exertion, cough, sputum, wheezing.  GI: See history of present illness. GU:  Negative for dysuria, hematuria, urinary incontinence, urinary frequency, nocturnal urination.  Endo: Negative for unusual weight change.    Physical Examination:   BP 132/75  Pulse 51  Temp(Src) 97.8 F (36.6 C) (Oral)  Wt 183 lb 3.2 oz (83.099 kg)  BMI 28.69 kg/m2  General: Well-nourished, well-developed in no acute distress. Accompanied by girlfriend. Eyes: No icterus. Mouth: Oropharyngeal mucosa moist and pink , no lesions erythema or exudate. Lungs: Clear to auscultation bilaterally.  Heart: Regular rate and rhythm, no murmurs rubs or gallops.  Abdomen: Bowel sounds are normal, moderate tenderness in the epigastrium and right lower quadrant.  nondistended, no hepatosplenomegaly or masses, no abdominal bruits or hernia , no rebound or guarding.   Extremities: No lower extremity edema. No clubbing or deformities. Neuro: Alert and oriented x 4   Skin: Warm and dry, no jaundice.  Psych: Alert and cooperative, normal mood and affect.  Labs:  Lab Results  Component Value Date   WBC 6.6 06/19/2013   HGB 13.7 06/19/2013   HCT 40.0 06/19/2013   MCV 93.2 06/19/2013   PLT 235 06/19/2013   Lab Results  Component Value Date   CREATININE 1.15 06/19/2013   BUN 16 06/19/2013   NA 136 06/19/2013   K 3.7 06/19/2013   CL 101 06/19/2013   CO2 27  06/19/2013   Lab Results  Component Value Date   ALT 15 06/19/2013   AST 19 06/19/2013   ALKPHOS 79 06/19/2013   BILITOT 0.2* 06/19/2013   Lab Results  Component Value Date   LIPASE 46 06/19/2013    Imaging Studies: Dg Abd Acute W/chest  06/20/2013   CLINICAL DATA:  Abdominal pain for 2 weeks, history of hiatal hernia and colonic polyps.  EXAM: ACUTE ABDOMEN SERIES (ABDOMEN 2 VIEW & CHEST 1 VIEW)  COMPARISON:  Abdominal ultrasound November 28, 2012.  FINDINGS: There is no evidence of dilated bowel loops or free intraperitoneal air. No radiopaque calculi or other significant radiographic abnormality is seen. Heart size and mediastinal contours are within normal limits. Both lungs are clear. Moderate pubic symphyseal osteoarthrosis.  IMPRESSION: Nonspecific bowel gas pattern.  No acute cardiopulmonary disease.   Electronically Signed   By: Awilda Metro   On: 06/20/2013 00:05

## 2013-07-13 NOTE — Interval H&P Note (Signed)
History and Physical Interval Note:  07/13/2013 11:41 AM  Frank Hansen  has presented today for surgery, with the diagnosis of LOWER ABDOMINAL PAIN AND RECTAL BLEEDING  The various methods of treatment have been discussed with the patient and family. After consideration of risks, benefits and other options for treatment, the patient has consented to  Procedure(s) with comments: FLEXIBLE SIGMOIDOSCOPY (N/A) - 1:15 as a surgical intervention .  The patient's history has been reviewed, patient examined, no change in status, stable for surgery.  I have reviewed the patient's chart and labs.  Questions were answered to the patient's satisfaction.     Patient denies diarrhea. In fact,  has some constipation, having a bowel movement sometimes only every other day. Having some gross blood per rectum with BMs. Abdominal pain unchanged. FS now to further evaluate.  Eula Listen

## 2013-07-13 NOTE — Op Note (Signed)
NAME:  Frank Hansen, Frank Hansen              ACCOUNT NO.:  [id number]  MEDICAL RECORD NO.:  [id number]  LOCATION:  [facility]                          FACILITY:  [facility]  PHYSICIAN:  R. Roetta Sessions, MD FACP FACGDATE OF BIRTH:  August 08, 1991  DATE OF PROCEDURE:  07/13/2013 DATE OF DISCHARGE:                              OPERATIVE REPORT   INDICATIONS FOR PROCEDURE:  A 22 year old gentleman with chronic abdominal pain.  Recent CT demonstrated some asymmetrical thickening of the rectal mucosa.  He has had some paper hematochezia.  Sigmoidoscopy is now being done.  Risks, benefits, limitations, alternatives, imponderables have been discussed, questions answered, all parties agreeable.  Please see the documentation in the medical record.    Procedure note: O2 saturation, blood pressure, pulse, respirations were monitored throughout the entirety of the procedure.  CONSCIOUS SEDATION:  Versed 4 mg IV and Demerol 75 mg IV in divided doses.  Zofran 4 mg IV.  INSTRUMENT:  Pentax video chip colonoscope.  FINDINGS:  Digital rectal exam revealed no abnormalities.  Endoscopic findings, prep was good.  Scope was advanced through the rectum into the distal sigmoid in a nice one-to-one fashion to 30 cm.  The sigmoid mucosa appeared entirely normal to 30 cm.  Examination of the rectal mucosa including retroflexed view of the anal verge demonstrated a couple areas of excoriated mucosa.  The distal rectum was most consistent with enema tip trauma.  There was few submucosal petechiae in the area.  However, there were no erosions or ulcerations.  Vascular pattern was preserved.  Therapeutic/diagnostic maneuvers performed:  The rectal mucosa was biopsied for histologic study.  The patient tolerated the procedure well, reactive to endoscopy.  IMPRESSION:  Minimal rectal mucosal abnormality consistent more with enema tip trauma than anything else.  No significant findings consistent with inflammatory bowel disease or  other abnormality.  Status post rectal biopsy.  The sigmoidoscopy revealed otherwise normal rectal/colonic mucosa to the mid sigmoid segment (30 cm).  RECOMMENDATIONS: 1. Follow up on pathology. 2. Further recommendations to follow.     Jonathon Bellows, MD Caleen Essex     RMR/MEDQ  D:  07/13/2013  T:  07/13/2013  Job:  [id number]

## 2013-07-15 ENCOUNTER — Encounter: Payer: Self-pay | Admitting: Internal Medicine

## 2013-07-19 ENCOUNTER — Encounter (HOSPITAL_COMMUNITY): Payer: Self-pay | Admitting: Internal Medicine

## 2013-08-09 ENCOUNTER — Encounter (INDEPENDENT_AMBULATORY_CARE_PROVIDER_SITE_OTHER): Payer: Self-pay

## 2013-08-09 ENCOUNTER — Ambulatory Visit: Payer: BC Managed Care – PPO | Admitting: Gastroenterology

## 2013-08-09 ENCOUNTER — Encounter: Payer: Self-pay | Admitting: Gastroenterology

## 2013-08-09 ENCOUNTER — Ambulatory Visit (INDEPENDENT_AMBULATORY_CARE_PROVIDER_SITE_OTHER): Payer: BC Managed Care – PPO | Admitting: Gastroenterology

## 2013-08-09 VITALS — BP 136/78 | HR 59 | Temp 98.5°F | Ht 66.0 in | Wt 187.8 lb

## 2013-08-09 DIAGNOSIS — K625 Hemorrhage of anus and rectum: Secondary | ICD-10-CM

## 2013-08-09 DIAGNOSIS — R1013 Epigastric pain: Secondary | ICD-10-CM

## 2013-08-09 NOTE — Progress Notes (Signed)
      Primary Care Physician: Harlow Asa, MD  Primary Gastroenterologist:  Roetta Sessions, MD   Chief Complaint  Patient presents with  . Follow-up    pt says he is doing well now    HPI: Frank Hansen is a 22 y.o. male here for follow-up of rectal bleeding, abdominal pain. Abdominal pain resolved after starting pantoprazole and bentyl in 05/2013. Denies diarrhea, melena, vomiting. Occasional heartburn. Appetite good. No weight loss, actually up 5+ pounds. Rare rectal bleeding.   Current Outpatient Prescriptions  Medication Sig Dispense Refill  . dicyclomine (BENTYL) 20 MG tablet One po QID prn abd spasms  40 tablet  0  . pantoprazole (PROTONIX) 40 MG tablet Take 1 tablet (40 mg total) by mouth daily. Prn acid reflux  30 tablet  2   No current facility-administered medications for this visit.    Allergies as of 08/09/2013  . (No Known Allergies)    ROS:  General: Negative for anorexia, weight loss, fever, chills, fatigue, weakness. ENT: Negative for hoarseness, difficulty swallowing , nasal congestion. CV: Negative for chest pain, angina, palpitations, dyspnea on exertion, peripheral edema.  Respiratory: Negative for dyspnea at rest, dyspnea on exertion, cough, sputum, wheezing.  GI: See history of present illness. GU:  Negative for dysuria, hematuria, urinary incontinence, urinary frequency, nocturnal urination.  Endo: Negative for unusual weight change.    Physical Examination:   BP 136/78  Pulse 59  Temp(Src) 98.5 F (36.9 C) (Oral)  Ht 5\' 6"  (1.676 m)  Wt 187 lb 12.8 oz (85.186 kg)  BMI 30.33 kg/m2  General: Well-nourished, well-developed in no acute distress.  Eyes: No icterus. Mouth: Oropharyngeal mucosa moist and pink , no lesions erythema or exudate. Lungs: Clear to auscultation bilaterally.  Heart: Regular rate and rhythm, no murmurs rubs or gallops.  Abdomen: Bowel sounds are normal, nontender, nondistended, no hepatosplenomegaly or masses, no  abdominal bruits or hernia , no rebound or guarding.   Extremities: No lower extremity edema. No clubbing or deformities. Neuro: Alert and oriented x 4   Skin: Warm and dry, no jaundice.   Psych: Alert and cooperative, normal mood and affect.

## 2013-08-09 NOTE — Assessment & Plan Note (Signed)
Doing well. Continue pantoprazole for now. Wean off bentyl to see how he does. Office visit as needed.

## 2013-08-09 NOTE — Patient Instructions (Signed)
1. Continue protonix once daily for your acid reflux. 2. Try to wean off of dicyclomine (Bentyl). Drop back to three times daily for one week. If no change in abdominal pain or bowel movement, drop back to twice a day for one week. Goal to use only as needed for abdominal pain, loose stool. 3. Office visit as needed.

## 2013-08-10 NOTE — Progress Notes (Signed)
CC'd to PCP 

## 2013-09-11 ENCOUNTER — Ambulatory Visit: Payer: BC Managed Care – PPO | Admitting: Gastroenterology

## 2013-10-15 IMAGING — US US ABDOMEN COMPLETE
1 series · 14 of 25 positions shown · non-contrast
Comparison: None.

CLINICAL DATA: Epigastric pain

ABDOMINAL ULTRASOUND COMPLETE

[Series 1: us abdomen complete · 0.23mm/px · 14 of 98 slices shown]
[im 1/98]
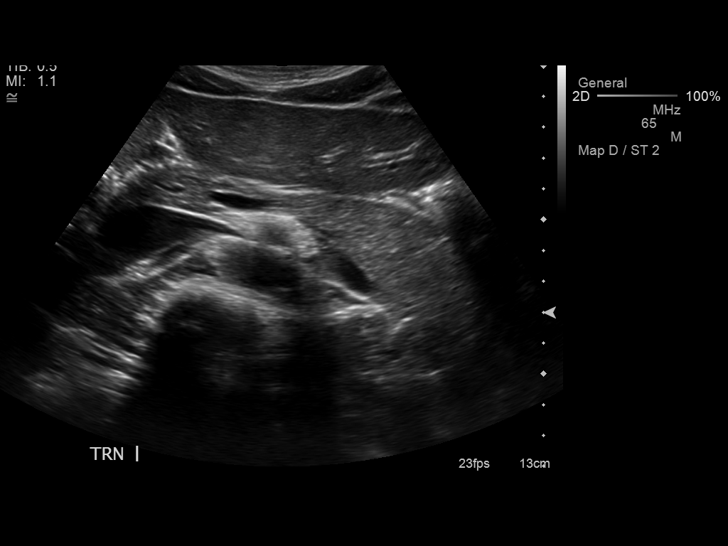
[im 9/98]
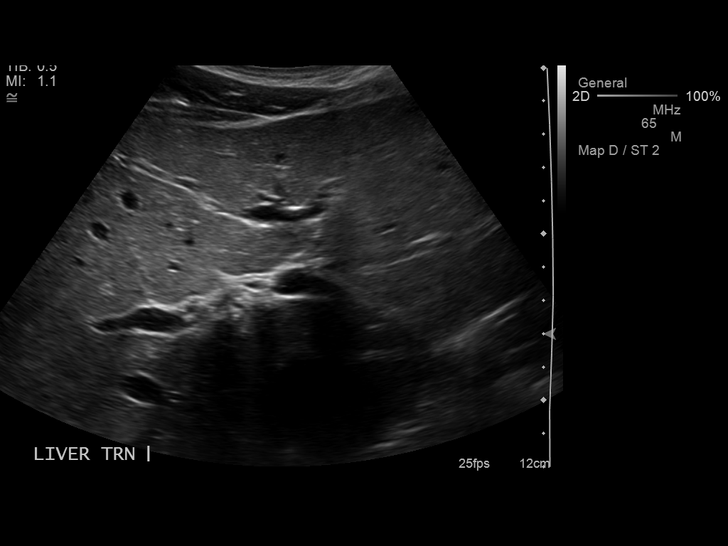
[im 17/98]
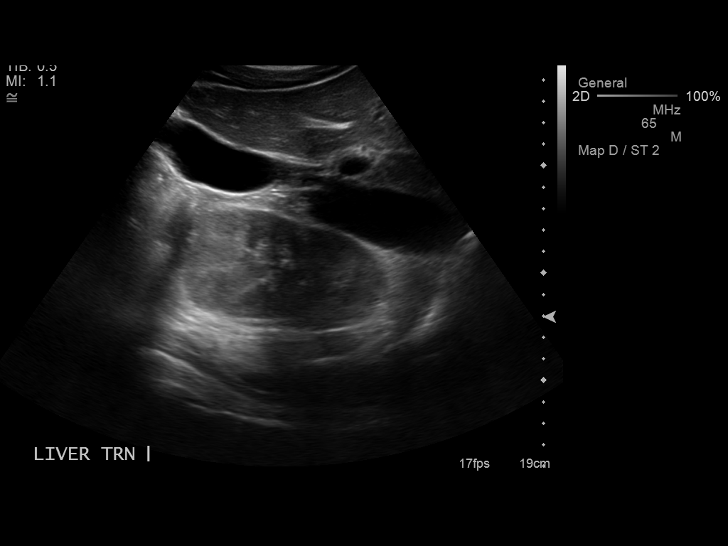
[im 25/98]
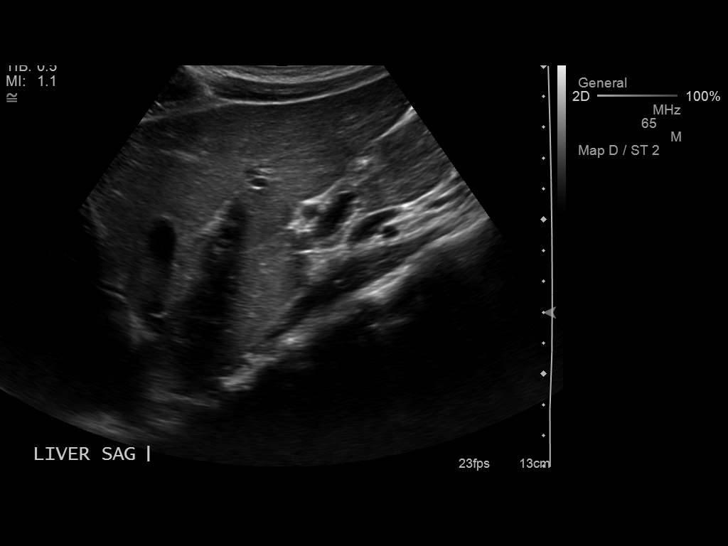
[im 33/98]
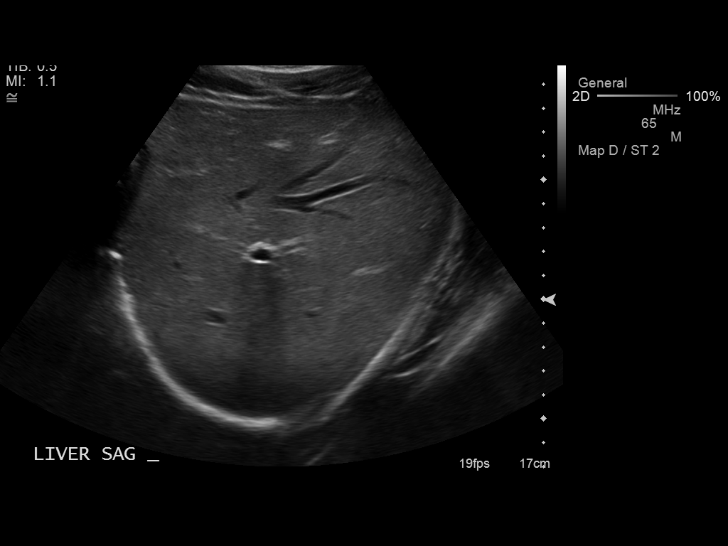
[im 37/98]
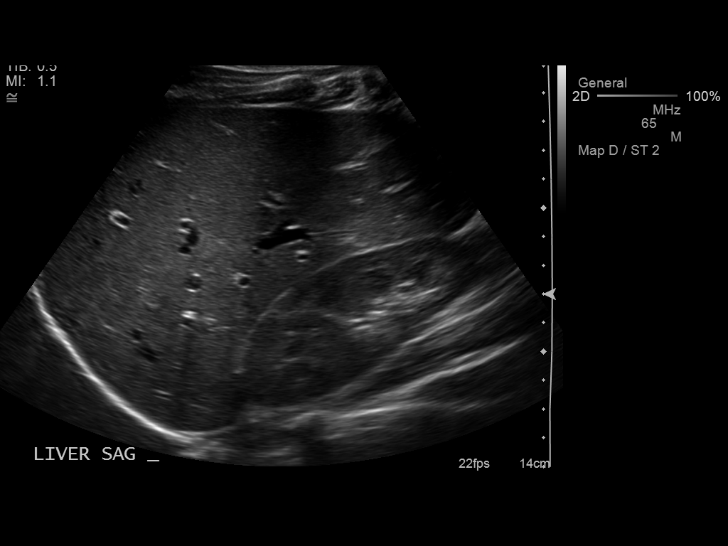
[im 45/98]
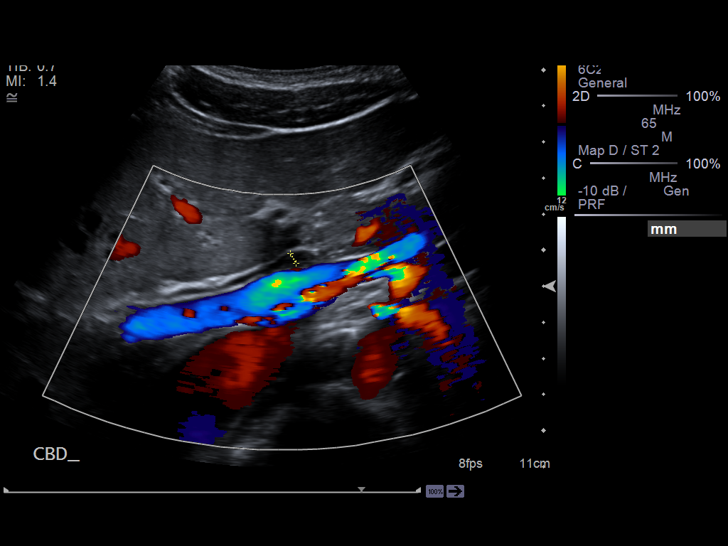
[im 53/98]
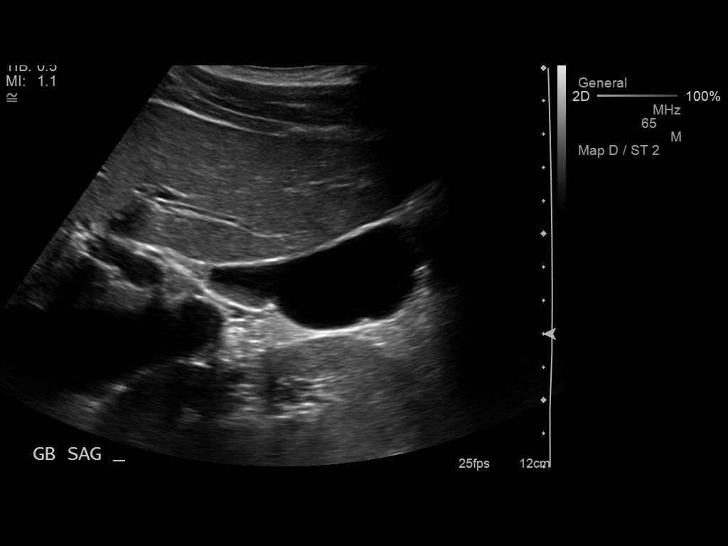
[im 61/98]
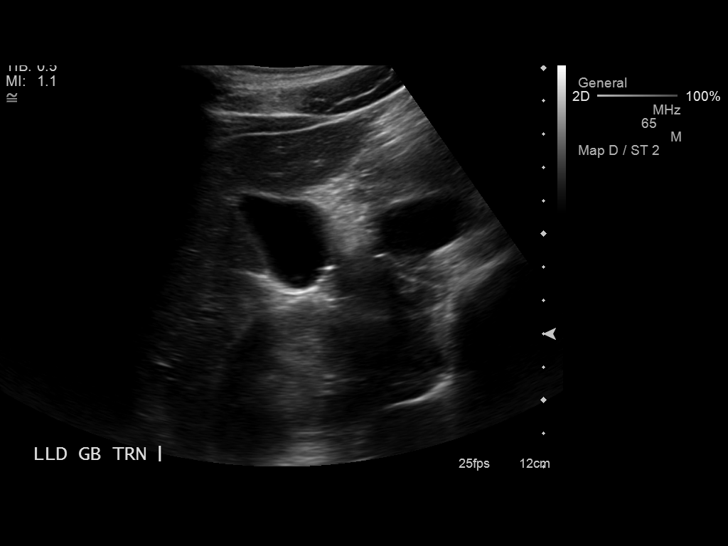
[im 65/98]
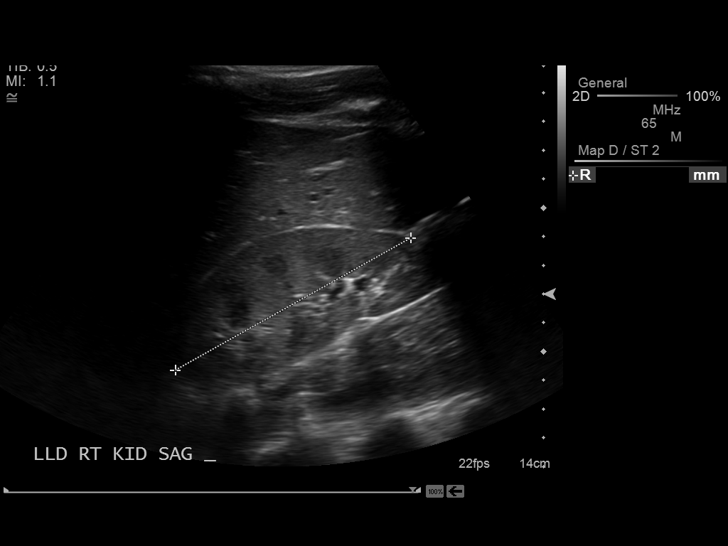
[im 73/98]
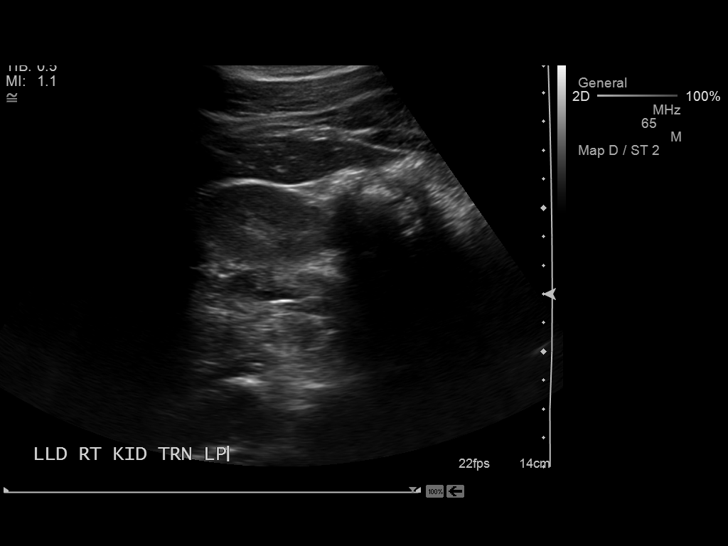
[im 81/98]
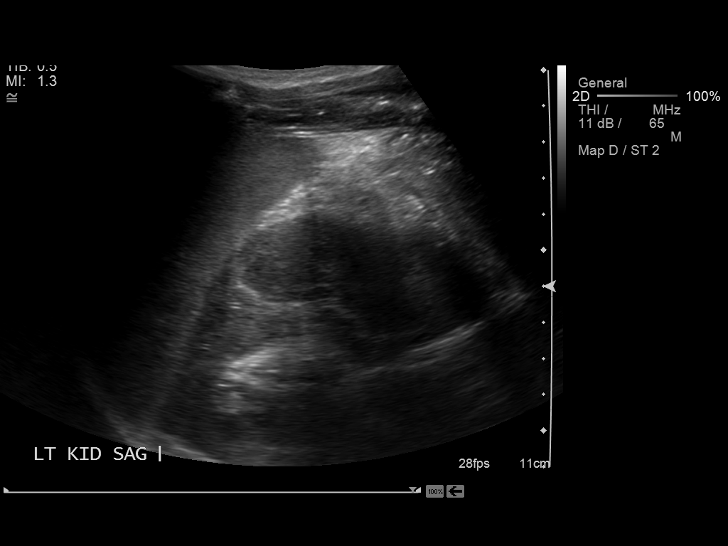
[im 89/98]
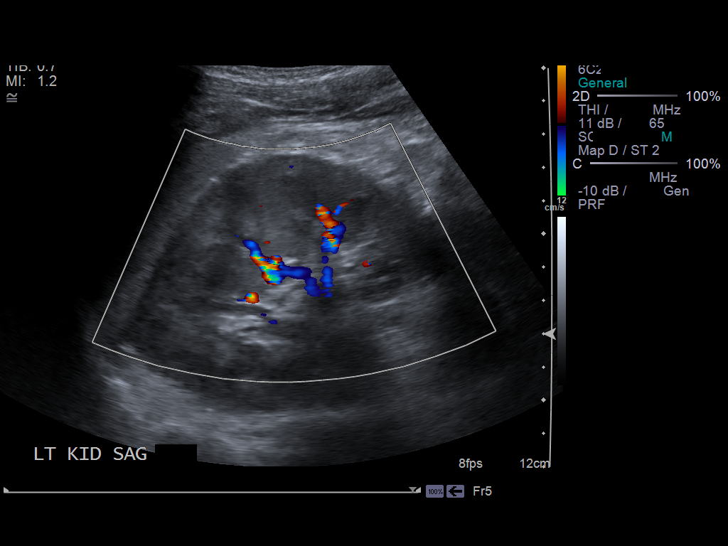
[im 98/98]
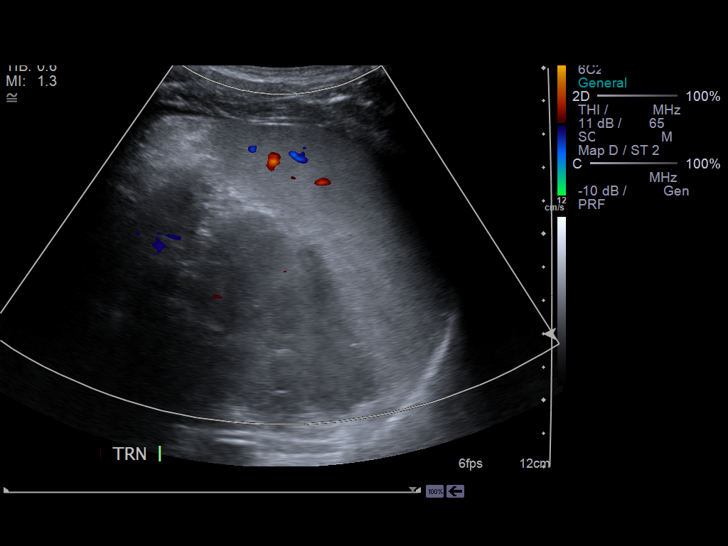

[14 of 25 positions shown; findings below may reference images not displayed]

FINDINGS: Gallbladder:  No gallstones, gallbladder wall thickening, or
pericholecystic fluid.

Common Bile Duct:  Within normal limits in caliber.

Liver: No focal mass lesion identified.  Within normal limits in
parenchymal echogenicity.

IVC:  Appears normal.

Pancreas:  No abnormality identified.

Spleen:  Within normal limits in size and echotexture.

Right kidney:  Normal in size and parenchymal echogenicity.  No
evidence of mass or hydronephrosis.

Left kidney:  Normal in size and parenchymal echogenicity.  No
evidence of mass or hydronephrosis.

Abdominal Aorta:  No aneurysm identified.
IMPRESSION: No acute intra-abdominal pathology.

## 2013-11-01 ENCOUNTER — Emergency Department (HOSPITAL_COMMUNITY)
Admission: EM | Admit: 2013-11-01 | Discharge: 2013-11-01 | Disposition: A | Discharge status: Home / Self Care | Payer: BC Managed Care – PPO | Attending: Emergency Medicine | Admitting: Emergency Medicine

## 2013-11-01 ENCOUNTER — Encounter (HOSPITAL_COMMUNITY): Payer: Self-pay | Admitting: Emergency Medicine

## 2013-11-01 DIAGNOSIS — Z8719 Personal history of other diseases of the digestive system: Secondary | ICD-10-CM | POA: Insufficient documentation

## 2013-11-01 DIAGNOSIS — G8929 Other chronic pain: Secondary | ICD-10-CM | POA: Insufficient documentation

## 2013-11-01 DIAGNOSIS — Z860100 Personal history of colon polyps, unspecified: Secondary | ICD-10-CM | POA: Insufficient documentation

## 2013-11-01 DIAGNOSIS — L02214 Cutaneous abscess of groin: Secondary | ICD-10-CM

## 2013-11-01 DIAGNOSIS — Z79899 Other long term (current) drug therapy: Secondary | ICD-10-CM | POA: Insufficient documentation

## 2013-11-01 DIAGNOSIS — L03319 Cellulitis of trunk, unspecified: Principal | ICD-10-CM

## 2013-11-01 DIAGNOSIS — Z87828 Personal history of other (healed) physical injury and trauma: Secondary | ICD-10-CM | POA: Insufficient documentation

## 2013-11-01 DIAGNOSIS — L02219 Cutaneous abscess of trunk, unspecified: Secondary | ICD-10-CM | POA: Insufficient documentation

## 2013-11-01 MED ORDER — SULFAMETHOXAZOLE-TMP DS 800-160 MG PO TABS
1.0000 | ORAL_TABLET | Freq: Once | ORAL | Status: AC
  Administered 2013-11-01: 1 via ORAL
  Filled 2013-11-01: qty 1

## 2013-11-01 MED ORDER — HYDROCODONE-ACETAMINOPHEN 5-325 MG PO TABS: 1.0000 | ORAL_TABLET | ORAL | Status: DC | PRN

## 2013-11-01 MED ORDER — HYDROCODONE-ACETAMINOPHEN 5-325 MG PO TABS
1.0000 | ORAL_TABLET | Freq: Once | ORAL | Status: AC
  Administered 2013-11-01: 1 via ORAL
  Filled 2013-11-01: qty 1

## 2013-11-01 MED ORDER — LIDOCAINE HCL (PF) 1 % IJ SOLN
INTRAMUSCULAR | Status: AC
  Filled 2013-11-01: qty 5

## 2013-11-01 MED ORDER — SULFAMETHOXAZOLE-TRIMETHOPRIM 800-160 MG PO TABS: 1.0000 | ORAL_TABLET | Freq: Two times a day (BID) | ORAL | Status: DC

## 2013-11-01 NOTE — Discharge Instructions (Signed)
Abscess An abscess (boil or furuncle) is an infected area on or under the skin. This area is filled with yellowish-white fluid (pus) and other material (debris). HOME CARE   Only take medicines as told by your doctor.  If you were given antibiotic medicine, take it as directed. Finish the medicine even if you start to feel better.  If gauze is used, follow your doctor's directions for changing the gauze.  To avoid spreading the infection:  Keep your abscess covered with a bandage.  Wash your hands well.  Do not share personal care items, towels, or whirlpools with others.  Avoid skin contact with others.  Keep your skin and clothes clean around the abscess.  Keep all doctor visits as told.  Use warm water soaks for 20 minutes several times daily to keep this area open and draining.  GET HELP RIGHT AWAY IF:   You have more pain, puffiness (swelling), or redness in the wound site.  You have more fluid or blood coming from the wound site.  You have muscle aches, chills, or you feel sick.  You have a fever. MAKE SURE YOU:   Understand these instructions.  Will watch your condition.  Will get help right away if you are not doing well or get worse. Document Released: 01/27/2008 Document Revised: 02/09/2012 Document Reviewed: 10/23/2011 [facility] Patient Information 2014 [place].

## 2013-11-01 NOTE — ED Provider Notes (Signed)
Patient seen/examined in the Emergency Department in conjunction with Midlevel Provider Idol Patient reports abscess to right groin Exam : abscess noted, drainage noted, no crepitus Plan: start antibiotics/pain control and PCP followup in 2 days   Sharyon Cable, MD 11/01/13 2030

## 2013-11-01 NOTE — ED Notes (Signed)
Pt has a boil in his right groin area x 5 days.

## 2013-11-01 NOTE — ED Provider Notes (Signed)
Medical screening examination/treatment/procedure(s) were conducted as a shared visit with non-physician practitioner(s) and myself.  I personally evaluated the patient during the encounter.   EKG Interpretation None        Sharyon Cable, MD 11/01/13 2309

## 2013-11-01 NOTE — ED Provider Notes (Signed)
CSN: [id number]     Arrival date & time 11/01/13  1831 History   First MD Initiated Contact with Patient 11/01/13 1931     Chief Complaint  Patient presents with  . Recurrent Skin Infections     (Consider location/radiation/quality/duration/timing/severity/associated sxs/prior Treatment) Patient is a 23 y.o. male presenting with abscess. The history is provided by the patient.  Abscess Location:  Ano-genital Ano-genital abscess location:  Groin Size:  5 cm Abscess quality: fluctuance, induration, painful, redness and warmth   Abscess quality: not draining   Red streaking: no   Duration:  5 days Progression:  Worsening Pain details:    Quality:  Sharp   Severity:  Severe   Duration:  5 days   Timing:  Constant   Progression:  Worsening Chronicity:  New Context: not diabetes, not immunosuppression and not skin injury   Relieved by:  Nothing Worsened by:  Nothing tried Ineffective treatments:  Warm compresses Associated symptoms: no fever, no nausea and no vomiting   Risk factors: no prior abscess     Past Medical History  Diagnosis Date  . Medial meniscus tear     needs surgery, Dr. Aline Brochure to perform in near future.   . Stomach problems     related to NSAID use  . Hx of adenomatous colonic polyps 09/2011    Surveillance colonoscopy due 09/2016  . Chronic abdominal pain   . Hiatal hernia    Past Surgical History  Procedure Laterality Date  . Acl graft and medial meniscus repair  12/12/10    left, needs repeat surgery for medial meniscus tear in near future, graft intact  . Colonoscopy  09/2011    GEX:BMWUXL rectal polyp-tubular adenoma. Normal TI. Next TCS in 09/2016.  Marland Kitchen Esophagogastroduodenoscopy  09/2011    KGM:WNUUV hiatal hernia otherwise negative exam.   . Flexible sigmoidoscopy N/A 07/13/2013    RMR: Minimal rectal mucosal abnormality consistent more with enema tip trauma than anything else.  No significant findings consistent with inflammatory bowel disease or  other abnormality, S/p rectal bx.The sigmoidoscopy revealed otherwise normal   Family History  Problem Relation Age of Onset  . Diabetes    . Colon cancer Neg Hx    History  Substance Use Topics  . Smoking status: Never Smoker   . Smokeless tobacco: Not on file  . Alcohol Use: No    Review of Systems  Constitutional: Negative for fever and chills.  HENT: Negative.   Respiratory: Negative for shortness of breath and wheezing.   Gastrointestinal: Negative for nausea and vomiting.  Skin: Positive for color change.  Neurological: Negative.       Allergies  Review of patient's allergies indicates no known allergies.  Home Medications   Current Outpatient Rx  Name  Route  Sig  Dispense  Refill  . dicyclomine (BENTYL) 20 MG tablet      One po QID prn abd spasms   40 tablet   0   . HYDROcodone-acetaminophen (NORCO/VICODIN) 5-325 MG per tablet   Oral   Take 1 tablet by mouth every 4 (four) hours as needed for moderate pain.   15 tablet   0   . pantoprazole (PROTONIX) 40 MG tablet   Oral   Take 1 tablet (40 mg total) by mouth daily. Prn acid reflux   30 tablet   2   . sulfamethoxazole-trimethoprim (SEPTRA DS) 800-160 MG per tablet   Oral   Take 1 tablet by mouth every 12 (twelve) hours.   [place]  tablet   0    BP 147/70  Pulse 76  Temp(Src) 98.9 F (37.2 C) (Oral)  Resp 24  Ht 5\' 6"  (1.676 m)  Wt 190 lb (86.183 kg)  BMI 30.68 kg/m2  SpO2 100% Physical Exam  Constitutional: He appears well-developed and well-nourished. No distress.  HENT:  Head: Normocephalic.  Neck: Neck supple.  Cardiovascular: Normal rate.   Pulmonary/Chest: Effort normal. He has no wheezes.  Musculoskeletal: Normal range of motion. He exhibits no edema.  Skin: There is erythema.  5 cm raised induration in right groin with surrounding erythema without red streaking.  There is no drainage.    ED Course  Procedures (including critical care time)  INCISION AND DRAINAGE Performed by:  Evalee Jefferson Consent: Verbal consent obtained. Risks and benefits: risks, benefits and alternatives were discussed Type: abscess  Body area: Right groin  Anesthesia: local infiltration  Incision was made with a scalpel.  Local anesthetic: lidocaine 1 % without epinephrine  Anesthetic total: 5 ml  Complexity: complex Blunt dissection to break up loculations  Drainage: purulent  Drainage amount: Moderate   Packing material: No packing Patient tolerance: Patient tolerated the procedure well with no immediate complications.    Labs Review Labs Reviewed - No data to display Imaging Review No results found.   EKG Interpretation None      MDM   Final diagnoses:  Abscess of groin, right    Patient was encouraged to start warm water soaks to maintain an open abscess site and continued drainage.  He was prescribed Bactrim and hydrocodone, first doses were given here in the ED.  Advise followup with his PCP for recheck in 2 days.  He still has moderate induration at the abscess site, given the location I felt it was potentially harmful to probe deeper looking for further pus pockets.  Patient was advised that if this does not heal completely he may need to have surgical evaluation for this abscess.  He understands this and will followup with his physician in 2 days.  Patient was seen by Dr. Christy Gentles during this ED visit as well.  The patient appears reasonably screened and/or stabilized for discharge and I doubt any other medical condition or other [facility] requiring further screening, evaluation, or treatment in the ED at this time prior to discharge.     Evalee Jefferson, PA-C 11/01/13 2133

## 2013-11-01 NOTE — ED Notes (Signed)
Pt alert & oriented x4, stable gait. Patient given discharge instructions, paperwork & prescription(s). Patient  instructed to stop at the registration desk to finish any additional paperwork. Patient verbalized understanding. Pt left department w/ no further questions. 

## 2013-11-04 ENCOUNTER — Encounter (HOSPITAL_COMMUNITY): Payer: Self-pay | Admitting: Emergency Medicine

## 2013-11-04 ENCOUNTER — Emergency Department (HOSPITAL_COMMUNITY)
Admission: EM | Admit: 2013-11-04 | Discharge: 2013-11-04 | Disposition: A | Discharge status: Home / Self Care | Payer: BC Managed Care – PPO | Attending: Emergency Medicine | Admitting: Emergency Medicine

## 2013-11-04 DIAGNOSIS — Z860100 Personal history of colon polyps, unspecified: Secondary | ICD-10-CM | POA: Insufficient documentation

## 2013-11-04 DIAGNOSIS — R51 Headache: Secondary | ICD-10-CM | POA: Insufficient documentation

## 2013-11-04 DIAGNOSIS — R109 Unspecified abdominal pain: Secondary | ICD-10-CM | POA: Insufficient documentation

## 2013-11-04 DIAGNOSIS — R112 Nausea with vomiting, unspecified: Secondary | ICD-10-CM | POA: Insufficient documentation

## 2013-11-04 DIAGNOSIS — Z79899 Other long term (current) drug therapy: Secondary | ICD-10-CM | POA: Insufficient documentation

## 2013-11-04 DIAGNOSIS — Z8719 Personal history of other diseases of the digestive system: Secondary | ICD-10-CM | POA: Insufficient documentation

## 2013-11-04 DIAGNOSIS — R42 Dizziness and giddiness: Secondary | ICD-10-CM | POA: Insufficient documentation

## 2013-11-04 DIAGNOSIS — Z9889 Other specified postprocedural states: Secondary | ICD-10-CM | POA: Insufficient documentation

## 2013-11-04 DIAGNOSIS — G8929 Other chronic pain: Secondary | ICD-10-CM | POA: Insufficient documentation

## 2013-11-04 MED ORDER — SODIUM CHLORIDE 0.9 % IV BOLUS (SEPSIS)
1000.0000 mL | Freq: Once | INTRAVENOUS | Status: AC
  Administered 2013-11-04: 1000 mL via INTRAVENOUS

## 2013-11-04 MED ORDER — ONDANSETRON HCL 4 MG PO TABS: 4.0000 mg | ORAL_TABLET | Freq: Three times a day (TID) | ORAL | Status: DC | PRN

## 2013-11-04 MED ORDER — ONDANSETRON HCL 4 MG/2ML IJ SOLN
4.0000 mg | Freq: Once | INTRAMUSCULAR | Status: AC
  Administered 2013-11-04: 4 mg via INTRAVENOUS
  Filled 2013-11-04: qty 2

## 2013-11-04 NOTE — Discharge Instructions (Signed)
Abdominal Pain, Adult °Many things can cause abdominal pain. Usually, abdominal pain is not caused by a disease and will improve without treatment. It can often be observed and treated at home. Your health care provider will do a physical exam and possibly order blood tests and X-rays to help determine the seriousness of your pain. However, in many cases, more time must pass before a clear cause of the pain can be found. Before that point, your health care provider may not know if you need more testing or further treatment. °HOME CARE INSTRUCTIONS  °Monitor your abdominal pain for any changes. The following actions may help to alleviate any discomfort you are experiencing: °· Only take over-the-counter or prescription medicines as directed by your health care provider. °· Do not take laxatives unless directed to do so by your health care provider. °· Try a clear liquid diet (broth, tea, or water) as directed by your health care provider. Slowly move to a bland diet as tolerated. °SEEK MEDICAL CARE IF: °· You have unexplained abdominal pain. °· You have abdominal pain associated with nausea or diarrhea. °· You have pain when you urinate or have a bowel movement. °· You experience abdominal pain that wakes you in the night. °· You have abdominal pain that is worsened or improved by eating food. °· You have abdominal pain that is worsened with eating fatty foods. °SEEK IMMEDIATE MEDICAL CARE IF:  °· Your pain does not go away within 2 hours. °· You have a fever. °· You keep throwing up (vomiting). °· Your pain is felt only in portions of the abdomen, such as the right side or the left lower portion of the abdomen. °· You pass bloody or black tarry stools. °MAKE SURE YOU: °· Understand these instructions.   °· Will watch your condition.   °· Will get help right away if you are not doing well or get worse.   °Document Released: 05/20/2005 Document Revised: 05/31/2013 Document Reviewed: 04/19/2013 °[employer]® Patient  Information ©2014 [employer]. ° °

## 2013-11-04 NOTE — ED Notes (Signed)
Patient given Ginger ale at this time. 

## 2013-11-04 NOTE — ED Provider Notes (Signed)
CSN: [id number]     Arrival date & time 11/04/13  1912 History  This chart was scribed for Charles B. Karle Starch, MD by Zettie Pho, ED Scribe. This patient was seen in room APA09/APA09 and the patient's care was started at 7:49 PM.    Chief Complaint  Patient presents with  . Abdominal Pain   The history is provided by the patient. No language interpreter was used.   HPI Comments: Frank Hansen is a 23 y.o. Male with a history of chronic abdominal pain, stomach problems related to NSAID use, and hiatal hernia who presents to the Emergency Department complaining of nausea with associated, multiple episodes of emesis, dizziness, and lightheadedness onset about 3.5 hours ago. Patient was seen here 3 days ago for an abscess to the groin, which he had incised and drained, and he was discharged with Bactrim and hydrocodone. Patient states that he is still currently taking these medications. Patient is complaining of a constant pain around the area where the abscess was. He denies diarrhea, constipation, blood in the stool, fever. Patient has no other pertinent medical history.   Past Medical History  Diagnosis Date  . Medial meniscus tear     needs surgery, Dr. Aline Brochure to perform in near future.   . Stomach problems     related to NSAID use  . Hx of adenomatous colonic polyps 09/2011    Surveillance colonoscopy due 09/2016  . Chronic abdominal pain   . Hiatal hernia    Past Surgical History  Procedure Laterality Date  . Acl graft and medial meniscus repair  12/12/10    left, needs repeat surgery for medial meniscus tear in near future, graft intact  . Colonoscopy  09/2011    KZS:WFUXNA rectal polyp-tubular adenoma. Normal TI. Next TCS in 09/2016.  Marland Kitchen Esophagogastroduodenoscopy  09/2011    TFT:DDUKG hiatal hernia otherwise negative exam.   . Flexible sigmoidoscopy N/A 07/13/2013    RMR: Minimal rectal mucosal abnormality consistent more with enema tip trauma than anything else.  No significant  findings consistent with inflammatory bowel disease or other abnormality, S/p rectal bx.The sigmoidoscopy revealed otherwise normal   Family History  Problem Relation Age of Onset  . Diabetes    . Colon cancer Neg Hx    History  Substance Use Topics  . Smoking status: Never Smoker   . Smokeless tobacco: Not on file  . Alcohol Use: No    Review of Systems  A complete 10 system review of systems was obtained and all systems are negative except as noted in the HPI and PMH.    Allergies  Review of patient's allergies indicates no known allergies.  Home Medications   Current Outpatient Rx  Name  Route  Sig  Dispense  Refill  . dicyclomine (BENTYL) 20 MG tablet      One po QID prn abd spasms   40 tablet   0   . HYDROcodone-acetaminophen (NORCO/VICODIN) 5-325 MG per tablet   Oral   Take 1 tablet by mouth every 4 (four) hours as needed for moderate pain.   15 tablet   0   . pantoprazole (PROTONIX) 40 MG tablet   Oral   Take 1 tablet (40 mg total) by mouth daily. Prn acid reflux   30 tablet   2   . sulfamethoxazole-trimethoprim (SEPTRA DS) 800-160 MG per tablet   Oral   Take 1 tablet by mouth every 12 (twelve) hours.   20 tablet   0  Triage Vitals: BP 132/84  Pulse 75  Temp(Src) 97.8 F (36.6 C) (Oral)  Resp 40  Ht 6\' 1"  (1.854 m)  Wt 190 lb (86.183 kg)  BMI 25.07 kg/m2  SpO2 100%  Physical Exam  Nursing note and vitals reviewed. Constitutional: He is oriented to person, place, and time. He appears well-developed and well-nourished.  HENT:  Head: Normocephalic and atraumatic.  Eyes: EOM are normal. Pupils are equal, round, and reactive to light.  Neck: Normal range of motion. Neck supple.  Cardiovascular: Normal rate, normal heart sounds and intact distal pulses.   Pulmonary/Chest: Effort normal and breath sounds normal.  Abdominal: Bowel sounds are normal. He exhibits no distension. There is tenderness. There is no rebound and no guarding.  Diffuse,  mild tenderness to palpation.   Genitourinary:  Healing I&D site, no signs of continued infection  Musculoskeletal: Normal range of motion. He exhibits no edema and no tenderness.  Neurological: He is alert and oriented to person, place, and time. He has normal strength. No cranial nerve deficit or sensory deficit.  Skin: Skin is warm and dry. No rash noted.  Psychiatric: He has a normal mood and affect.    ED Course  Procedures (including critical care time)  DIAGNOSTIC STUDIES: Oxygen Saturation is 100% on room air, normal by my interpretation.    COORDINATION OF CARE: 7:52 PM- Will order IV fluids and Zofran to manage symptoms. Discussed treatment plan with patient at bedside and patient verbalized agreement.     Labs Review Labs Reviewed - No data to display Imaging Review No results found.   EKG Interpretation None      MDM   Final diagnoses:  Abdominal pain    Pt with long history of chronic abdominal problems with numerous prior negative ED workups has benign abdomen today and comfortable appearing on exam. His pain is primarily at the site of the previously drained abscess which is healing well. Feeling better after IVF and Zofran, no vomiting in the ED, tolerating PO fluids well  I personally performed the services described in this documentation, which was scribed in my presence. The recorded information has been reviewed and is accurate.       Charles B. Karle Starch, MD 11/04/13 2228

## 2013-11-04 NOTE — ED Notes (Signed)
C/o pain RLQ onset today 4pm. Vomited times 2 today. Hx of groin abcess last week

## 2013-11-30 ENCOUNTER — Encounter: Payer: Self-pay | Admitting: Family Medicine

## 2013-11-30 ENCOUNTER — Ambulatory Visit (INDEPENDENT_AMBULATORY_CARE_PROVIDER_SITE_OTHER): Payer: BC Managed Care – PPO | Admitting: Family Medicine

## 2013-11-30 VITALS — BP 122/78 | Temp 97.9°F | Ht 66.0 in | Wt 195.0 lb

## 2013-11-30 DIAGNOSIS — J302 Other seasonal allergic rhinitis: Secondary | ICD-10-CM

## 2013-11-30 DIAGNOSIS — J309 Allergic rhinitis, unspecified: Secondary | ICD-10-CM

## 2013-11-30 MED ORDER — FLUTICASONE PROPIONATE 50 MCG/ACT NA SUSP: 2.0000 | Freq: Every day | NASAL | Status: DC

## 2013-11-30 MED ORDER — METHYLPREDNISOLONE ACETATE 80 MG/ML IJ SUSP
80.0000 mg | Freq: Once | INTRAMUSCULAR | Status: AC
  Administered 2013-11-30: 80 mg via INTRAMUSCULAR

## 2013-11-30 MED ORDER — ALBUTEROL SULFATE HFA 108 (90 BASE) MCG/ACT IN AERS: 2.0000 | INHALATION_SPRAY | Freq: Four times a day (QID) | RESPIRATORY_TRACT | Status: DC | PRN

## 2013-11-30 MED ORDER — CETIRIZINE HCL 10 MG PO TABS: 10.0000 mg | ORAL_TABLET | Freq: Every day | ORAL | Status: DC

## 2013-11-30 NOTE — Progress Notes (Signed)
   Subjective:    Patient ID: Frank Hansen, male    DOB: [birth year], 23 y.o.   MRN: [id number]  HPIEyes watery, itching,  and burning, sneezing, nasal congestion. Starts every April. Taking benadryl and clairtin. Not helping  Not working outside  Both inside and outside symtoms  Tightness in the chest Hx of wheezing Review of Systems No headache no chest pain no back pain no abdominal pain ROS otherwise negative    Objective:   Physical Exam  Alert moderate malaise. Vital stable. Significant nasal discharge sneezing eyes running pharynx normal neck supple. Lungs clear heart rare in rhythm.      Assessment & Plan:  Impression fairly severe allergic rhinitis with element of reactive airways flaring up by history plan Zyrtec daily. Flonase daily. Steroid injection. Albuterol when necessary. WSL

## 2014-07-13 ENCOUNTER — Encounter: Payer: Self-pay | Admitting: Gastroenterology

## 2014-07-13 ENCOUNTER — Encounter (INDEPENDENT_AMBULATORY_CARE_PROVIDER_SITE_OTHER): Payer: Self-pay

## 2014-07-13 ENCOUNTER — Ambulatory Visit (INDEPENDENT_AMBULATORY_CARE_PROVIDER_SITE_OTHER): Payer: BC Managed Care – PPO | Admitting: Gastroenterology

## 2014-07-13 VITALS — BP 135/77 | HR 77 | Temp 97.6°F | Ht 66.0 in | Wt 183.0 lb

## 2014-07-13 DIAGNOSIS — R1013 Epigastric pain: Secondary | ICD-10-CM

## 2014-07-13 DIAGNOSIS — K625 Hemorrhage of anus and rectum: Secondary | ICD-10-CM

## 2014-07-13 LAB — CBC WITH DIFFERENTIAL/PLATELET
BASOS PCT: 0 % (ref 0–1)
Basophils Absolute: 0 10*3/uL (ref 0.0–0.1)
EOS ABS: 0.3 10*3/uL (ref 0.0–0.7)
Eosinophils Relative: 4 % (ref 0–5)
HEMATOCRIT: 43.1 % (ref 39.0–52.0)
HEMOGLOBIN: 15.4 g/dL (ref 13.0–17.0)
LYMPHS ABS: 2.5 10*3/uL (ref 0.7–4.0)
Lymphocytes Relative: 40 % (ref 12–46)
MCH: 32.5 pg (ref 26.0–34.0)
MCHC: 35.7 g/dL (ref 30.0–36.0)
MCV: 90.9 fL (ref 78.0–100.0)
MONO ABS: 0.5 10*3/uL (ref 0.1–1.0)
MONOS PCT: 8 % (ref 3–12)
MPV: 9.5 fL (ref 9.4–12.4)
Neutro Abs: 3 10*3/uL (ref 1.7–7.7)
Neutrophils Relative %: 48 % (ref 43–77)
Platelets: 279 10*3/uL (ref 150–400)
RBC: 4.74 MIL/uL (ref 4.22–5.81)
RDW: 13.4 % (ref 11.5–15.5)
WBC: 6.3 10*3/uL (ref 4.0–10.5)

## 2014-07-13 LAB — COMPREHENSIVE METABOLIC PANEL
ALK PHOS: 81 U/L (ref 39–117)
ALT: 14 U/L (ref 0–53)
AST: 16 U/L (ref 0–37)
Albumin: 4.3 g/dL (ref 3.5–5.2)
BUN: 14 mg/dL (ref 6–23)
CO2: 28 meq/L (ref 19–32)
Calcium: 9.6 mg/dL (ref 8.4–10.5)
Chloride: 102 mEq/L (ref 96–112)
Creat: 1.07 mg/dL (ref 0.50–1.35)
Glucose, Bld: 90 mg/dL (ref 70–99)
Potassium: 4.6 mEq/L (ref 3.5–5.3)
SODIUM: 138 meq/L (ref 135–145)
Total Bilirubin: 0.4 mg/dL (ref 0.2–1.2)
Total Protein: 7.4 g/dL (ref 6.0–8.3)

## 2014-07-13 LAB — LIPASE: Lipase: 43 U/L (ref 0–75)

## 2014-07-13 MED ORDER — PANTOPRAZOLE SODIUM 40 MG PO TBEC: 40.0000 mg | DELAYED_RELEASE_TABLET | Freq: Every day | ORAL | Status: DC

## 2014-07-13 MED ORDER — HYDROCORTISONE ACETATE 25 MG RE SUPP: 25.0000 mg | Freq: Two times a day (BID) | RECTAL | Status: DC

## 2014-07-13 NOTE — Assessment & Plan Note (Signed)
Recurrent intermittent epigastric pain with and without meals. No typical heartburn. Previous EGD fairly unremarkable. His abdominal pain is previously responded well to Protonix. We will resume Protonix 40 mg daily today. However will check labs to look for biliary etiology, etc. Return to the office in 3 months. We will be touching base with the patient after labs to see how he is doing on the medication.

## 2014-07-13 NOTE — Assessment & Plan Note (Signed)
Single episode of small volume paper hematochezia. Some vague rectal discomfort at times with bowel movements. Previous colonoscopy and sigmoidoscopy as outlined. Trial of Anusol twice a day for 12 days. Return to the office in 3 months. If symptoms are progressive he may require yet another sigmoidoscopy. Patient voiced understanding.

## 2014-07-13 NOTE — Patient Instructions (Signed)
1. Have your labs done. We will call you with results within 7 business days. 2. Start pantoprazole one pill daily before breakfast. 3. Start anusol suppositories twice daily for 12 days.  4. Both of your prescriptions were sent to [facility]. 5. Return to the office in three months.

## 2014-07-13 NOTE — Progress Notes (Signed)
Primary Care Physician:  Rubbie Battiest, MD  Primary Gastroenterologist:  Garfield Cornea, MD   Chief Complaint  Patient presents with  . Abdominal Pain  . Rectal Bleeding    Returned 06/05/14    HPI:  Frank Hansen is a 23 y.o. male here for recurrent abdominal pain and rectal bleeding. Last seen December 2014. Doing well at that time. In 2013 he had an EGD which showed small hiatal hernia. Colonoscopy showed single rectal polyp which was a tubular adenoma. Normal terminal ileum. Next colonoscopy planned for 2018. 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 He underwent a flexible sigmoidoscopy with minimal rectal mucosa irritation possibly related to enema tip trauma. No evidence of IBD.  Patient presents today complaining of intermittent epigastric burning pain, worse with meals and with fasting. Some nausea. Off protonix for months. No heartburn. No dysphagia. BM regular. Noted rectal bleeding on 06/05/14. Blood noted on the tissue. No melena. Occasionally has some rectal discomfort with bowel movements. Currently not on any medications, takes allergy medications and albuterol only as needed. Denies NSAIDs or aspirin. Denies alcohol. Currently working as a Development worker, international aid.  Current Outpatient Prescriptions  Medication Sig Dispense Refill  . albuterol (PROVENTIL HFA;VENTOLIN HFA) 108 (90 BASE) MCG/ACT inhaler Inhale 2 puffs into the lungs every 6 (six) hours as needed for wheezing or shortness of breath. 1 Inhaler 3  . cetirizine (ZYRTEC) 10 MG tablet Take 1 tablet (10 mg total) by mouth daily. 30 tablet 3  . fluticasone (FLONASE) 50 MCG/ACT nasal spray Place 2 sprays into both nostrils daily. 16 g 3  .    0  .    5   No current facility-administered medications for this visit.    Allergies as of 07/13/2014  . (No Known Allergies)    Past Medical History  Diagnosis Date  . Medial meniscus tear     needs surgery, Dr.  Aline Brochure to perform in near future.   . Stomach problems     related to NSAID use  . Hx of adenomatous colonic polyps 09/2011    Surveillance colonoscopy due 09/2016  . Chronic abdominal pain   . Hiatal hernia     Past Surgical History  Procedure Laterality Date  . Acl graft and medial meniscus repair  12/12/10    left, needs repeat surgery for medial meniscus tear in near future, graft intact  . Colonoscopy  09/2011    NLG:XQJJHE rectal polyp-tubular adenoma. Normal TI. Next TCS in 09/2016.  Marland Kitchen Esophagogastroduodenoscopy  09/2011    RDE:YCXKG hiatal hernia otherwise negative exam.   . Flexible sigmoidoscopy N/A 07/13/2013    RMR: Minimal rectal mucosal abnormality consistent more with enema tip trauma than anything else.  No significant findings consistent with inflammatory bowel disease or other abnormality, S/p rectal bx.The sigmoidoscopy revealed otherwise normal    Family History  Problem Relation Age of Onset  . Diabetes    . Colon cancer Neg Hx     History   Social History  . Marital Status: Single    Spouse Name: N/A    Number of Children: 0  . Years of Education: N/A   Occupational History  . student     [employer]   .     Social History Main Topics  . Smoking status: Never Smoker   . Smokeless tobacco: Not on file  . Alcohol Use: No  . Drug Use: No  .  Sexual Activity: Not on file   Other Topics Concern  . Not on file   Social History Narrative      ROS:  General: Negative for anorexia, weight loss, fever, chills, fatigue, weakness. Eyes: Negative for vision changes.  ENT: Negative for hoarseness, difficulty swallowing , nasal congestion. CV: Negative for chest pain, angina, palpitations, dyspnea on exertion, peripheral edema.  Respiratory: Negative for dyspnea at rest, dyspnea on exertion, cough, sputum, wheezing.  GI: See history of present illness. GU:  Negative for dysuria, hematuria, urinary incontinence, urinary  frequency, nocturnal urination.  MS: Negative for joint pain, low back pain.  Derm: Negative for rash or itching.  Neuro: Negative for weakness, abnormal sensation, seizure, frequent headaches, memory loss, confusion.  Psych: Negative for anxiety, depression, suicidal ideation, hallucinations.  Endo: Negative for unusual weight change.  Heme: Negative for bruising or bleeding. Allergy: Negative for rash or hives.    Physical Examination:  BP 135/77 mmHg  Pulse 77  Temp(Src) 97.6 F (36.4 C) (Oral)  Ht 5\' 6"  (1.676 m)  Wt 183 lb (83.008 kg)  BMI 29.55 kg/m2   General: Well-nourished, well-developed in no acute distress.  Head: Normocephalic, atraumatic.   Eyes: Conjunctiva pink, no icterus. Mouth: Oropharyngeal mucosa moist and pink , no lesions erythema or exudate. Neck: Supple without thyromegaly, masses, or lymphadenopathy.  Lungs: Clear to auscultation bilaterally.  Heart: Regular rate and rhythm, no murmurs rubs or gallops.  Abdomen: Bowel sounds are normal, mild to moderate epigastric/right upper quadrant tenderness minimal tenderness in the lower abdomen, nondistended, no hepatosplenomegaly or masses, no abdominal bruits or    hernia , no rebound or guarding.   Rectal: Patient declined Extremities: No lower extremity edema. No clubbing or deformities.  Neuro: Alert and oriented x 4 , grossly normal neurologically.  Skin: Warm and dry, no rash or jaundice.   Psych: Alert and cooperative, normal mood and affect.

## 2014-07-16 ENCOUNTER — Other Ambulatory Visit: Payer: Self-pay | Admitting: Gastroenterology

## 2014-07-16 NOTE — Progress Notes (Signed)
Quick Note:  Please let patient know his labs are perfect! Continue Protonix. Call if stomach pain does not improve. Call if recurrent rectal bleeding. Otherwise, return in 3 months as outlined day of OV. ______

## 2014-07-16 NOTE — Progress Notes (Signed)
cc'ed to pcp °

## 2014-07-17 ENCOUNTER — Encounter: Payer: Self-pay | Admitting: Internal Medicine

## 2014-07-17 NOTE — Progress Notes (Signed)
PATIENT ON February SCHEDULE

## 2014-07-24 ENCOUNTER — Telehealth: Payer: Self-pay | Admitting: Internal Medicine

## 2014-07-24 NOTE — Telephone Encounter (Signed)
MEDICINE THAT WAS CALLED INTO HIS PHARMACY AND INSURANCE WILL NOT COVER. IS THERE ANYTHING ELSE HER CAN HAVE CALLED IN  PLEASE ADVISE

## 2014-07-30 ENCOUNTER — Other Ambulatory Visit: Payer: Self-pay

## 2014-07-31 MED ORDER — PANTOPRAZOLE SODIUM 40 MG PO TBEC: 40.0000 mg | DELAYED_RELEASE_TABLET | Freq: Every day | ORAL | Status: DC

## 2014-08-03 NOTE — Telephone Encounter (Signed)
I had called the pharmacy regarding medication because I had not received anything from them and I need prescription insurance info. I just got a fax today. Will work on [employer]

## 2014-08-22 ENCOUNTER — Telehealth: Payer: Self-pay

## 2014-08-22 NOTE — Telephone Encounter (Signed)
Pantoprazole has been approved for 1 year by [employer] [facility]. Sent approval to the pharmacy.

## 2014-10-15 ENCOUNTER — Encounter: Payer: Self-pay | Admitting: Gastroenterology

## 2014-10-15 ENCOUNTER — Ambulatory Visit (INDEPENDENT_AMBULATORY_CARE_PROVIDER_SITE_OTHER): Payer: BLUE CROSS/BLUE SHIELD | Admitting: Gastroenterology

## 2014-10-15 ENCOUNTER — Encounter (INDEPENDENT_AMBULATORY_CARE_PROVIDER_SITE_OTHER): Payer: Self-pay

## 2014-10-15 VITALS — BP 136/66 | HR 74 | Temp 98.1°F | Ht 67.0 in | Wt 197.6 lb

## 2014-10-15 DIAGNOSIS — R197 Diarrhea, unspecified: Secondary | ICD-10-CM

## 2014-10-15 DIAGNOSIS — R1013 Epigastric pain: Secondary | ICD-10-CM

## 2014-10-15 MED ORDER — ESOMEPRAZOLE MAGNESIUM 40 MG PO CPDR: 40.0000 mg | DELAYED_RELEASE_CAPSULE | Freq: Every day | ORAL | Status: DC

## 2014-10-15 NOTE — Patient Instructions (Signed)
1. Stop pantoprazole. 2. Start Nexium one daily before breakfast. Prescription sent to pharmacy. 3. Call in four weeks and let me know how your pain and bowel movements are. If your symptoms worsen, call me sooner please.

## 2014-10-15 NOTE — Assessment & Plan Note (Signed)
Extensively evaluated in the past. Previously epigastric pain resolved on PPI. Reports symptoms daily but intermittent. Unrelated to meals, BMs, movement. Wakes him up at night. Stopped protonix. Thought it was source of diarrhea although he has been on it for months. Denies ill contacts. Limited brbpr.   Discussed trial of different PPI prior to embarking on additional testing. Patient prefers this approach. Stop pantoprazole. Start Nexium once daily before breakfast. Call in 4 weeks with PR or sooner if symptoms worsen.

## 2014-10-15 NOTE — Progress Notes (Signed)
cc'ed to pcp °

## 2014-10-15 NOTE — Progress Notes (Signed)
Primary Care Physician: Rubbie Battiest, MD  Primary Gastroenterologist:  Garfield Cornea, MD   Chief Complaint  Patient presents with  . Abdominal Pain    HPI: Frank Hansen is a 24 y.o. male here for follow up. Last seen in 06/2014. He has a history of recurrent abdominal pain and rectal bleeding. In 2013 he had an EGD which showed a hiatal hernia. Colonoscopy showed a single rectal polyp which was tumor adenoma. Normal terminal ileum. Next colonoscopy planned for 2018. Flexible sigmoidoscopy performed in 2014 for CT showing proctitis. He had minimal rectal mucosal abnormality consistent more with enema tip trauma than anything. No evidence of IBD.  Work up also included abd u/s and HIDA in 2014 which were unremarkable.   Weight is up 14 pounds. 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 Recently stopped protonix about one week ago. Thought it was causing diarrhea. Stopped the med but diarrhea didn't improve. Stools from loose to formed. No nocturnal. About 3 per day. Single episode of very tiny amount of brbpr. Epigastric pain, no nausea/vomiting. Rare heartburn. Pain unrelated to meals or BMs. Gets woken up with it. Not worse with movement. No melena. No NSAIDS or ASA.  Current Outpatient Prescriptions  Medication Sig Dispense Refill  . albuterol (PROVENTIL HFA;VENTOLIN HFA) 108 (90 BASE) MCG/ACT inhaler Inhale 2 puffs into the lungs every 6 (six) hours as needed for wheezing or shortness of breath. 1 Inhaler 3  . cetirizine (ZYRTEC) 10 MG tablet Take 1 tablet (10 mg total) by mouth daily. 30 tablet 3  . fluticasone (FLONASE) 50 MCG/ACT nasal spray Place 2 sprays into both nostrils daily. 16 g 3  . pantoprazole (PROTONIX) 40 MG tablet Take 1 tablet (40 mg total) by mouth daily. 30 tablet 5   No current facility-administered medications for this visit.    Allergies as of 10/15/2014  . (No Known Allergies)   Past  Medical History  Diagnosis Date  . Medial meniscus tear     needs surgery, Dr. Aline Brochure to perform in near future.   . Stomach problems     related to NSAID use  . Hx of adenomatous colonic polyps 09/2011    Surveillance colonoscopy due 09/2016  . Chronic abdominal pain   . Hiatal hernia    Past Surgical History  Procedure Laterality Date  . Acl graft and medial meniscus repair  12/12/10    left, needs repeat surgery for medial meniscus tear in near future, graft intact  . Colonoscopy  09/2011    TIR:WERXVQ rectal polyp-tubular adenoma. Normal TI. Next TCS in 09/2016.  Marland Kitchen Esophagogastroduodenoscopy  09/2011    MGQ:QPYPP hiatal hernia otherwise negative exam.   . Flexible sigmoidoscopy N/A 07/13/2013    RMR: Minimal rectal mucosal abnormality consistent more with enema tip trauma than anything else.  No significant findings consistent with inflammatory bowel disease or other abnormality, S/p rectal bx.The sigmoidoscopy revealed otherwise normal    ROS:  General: Negative for anorexia, weight loss, fever, chills, fatigue, weakness. ENT: Negative for hoarseness, difficulty swallowing , nasal congestion. CV: Negative for chest pain, angina, palpitations, dyspnea on exertion, peripheral edema.  Respiratory: Negative for dyspnea at rest, dyspnea on exertion, cough, sputum, wheezing.  GI: See history of present illness. GU:  Negative for dysuria, hematuria, urinary incontinence, urinary frequency, nocturnal urination.  Endo: Negative for unusual weight change.    Physical Examination:   BP  136/66 mmHg  Pulse 74  Temp(Src) 98.1 F (36.7 C) (Oral)  Ht 5\' 7"  (1.702 m)  Wt 197 lb 9.6 oz (89.631 kg)  BMI 30.94 kg/m2  General: Well-nourished, well-developed in no acute distress.  Eyes: No icterus. Mouth: Oropharyngeal mucosa moist and pink , no lesions erythema or exudate. Lungs: Clear to auscultation bilaterally.  Heart: Regular rate and rhythm, no murmurs rubs or gallops.  Abdomen:  Bowel sounds are normal, mild epigastric and RUQ tenderness, nondistended, no hepatosplenomegaly or masses, no abdominal bruits or hernia , no rebound or guarding.   Extremities: No lower extremity edema. No clubbing or deformities. Neuro: Alert and oriented x 4   Skin: Warm and dry, no jaundice.   Psych: Alert and cooperative, normal mood and affect.  Labs:  Lab Results  Component Value Date   WBC 6.3 07/13/2014   HGB 15.4 07/13/2014   HCT 43.1 07/13/2014   MCV 90.9 07/13/2014   PLT 279 07/13/2014   Lab Results  Component Value Date   CREATININE 1.07 07/13/2014   BUN 14 07/13/2014   NA 138 07/13/2014   K 4.6 07/13/2014   CL 102 07/13/2014   CO2 28 07/13/2014   Lab Results  Component Value Date   ALT 14 07/13/2014   AST 16 07/13/2014   ALKPHOS 81 07/13/2014   BILITOT 0.4 07/13/2014    Imaging Studies: No results found.

## 2014-12-07 ENCOUNTER — Ambulatory Visit (INDEPENDENT_AMBULATORY_CARE_PROVIDER_SITE_OTHER): Payer: BLUE CROSS/BLUE SHIELD | Admitting: Nurse Practitioner

## 2014-12-07 ENCOUNTER — Encounter: Payer: Self-pay | Admitting: Nurse Practitioner

## 2014-12-07 VITALS — BP 130/82 | Ht 66.0 in | Wt 200.6 lb

## 2014-12-07 DIAGNOSIS — J302 Other seasonal allergic rhinitis: Secondary | ICD-10-CM

## 2014-12-07 DIAGNOSIS — J4541 Moderate persistent asthma with (acute) exacerbation: Secondary | ICD-10-CM

## 2014-12-07 DIAGNOSIS — K219 Gastro-esophageal reflux disease without esophagitis: Secondary | ICD-10-CM | POA: Diagnosis not present

## 2014-12-07 MED ORDER — ALBUTEROL SULFATE HFA 108 (90 BASE) MCG/ACT IN AERS: 2.0000 | INHALATION_SPRAY | RESPIRATORY_TRACT | Status: DC | PRN

## 2014-12-07 MED ORDER — ALBUTEROL SULFATE (2.5 MG/3ML) 0.083% IN NEBU: INHALATION_SOLUTION | RESPIRATORY_TRACT | Status: DC

## 2014-12-07 MED ORDER — ESOMEPRAZOLE MAGNESIUM 40 MG PO CPDR: 40.0000 mg | DELAYED_RELEASE_CAPSULE | Freq: Two times a day (BID) | ORAL | Status: DC

## 2014-12-07 MED ORDER — PREDNISONE 20 MG PO TABS: ORAL_TABLET | ORAL | Status: DC

## 2014-12-07 NOTE — Patient Instructions (Signed)
Zaditor eye drops (generic) 

## 2014-12-09 ENCOUNTER — Encounter: Payer: Self-pay | Admitting: Nurse Practitioner

## 2014-12-09 DIAGNOSIS — K219 Gastro-esophageal reflux disease without esophagitis: Secondary | ICD-10-CM | POA: Insufficient documentation

## 2014-12-09 DIAGNOSIS — J4540 Moderate persistent asthma, uncomplicated: Secondary | ICD-10-CM | POA: Insufficient documentation

## 2014-12-09 NOTE — Progress Notes (Signed)
Subjective:  Presents for recheck of his asthma. Was under good control until flare up for the past 2-3 weeks. Began after working outside. No fever. No sore throat or ear pain. Occasional headache. Frequent cough producing clear sputum. Itchy eyes.  Frequent wheezing, using someone else's albuterol inhaler about 5-6 times per day. Worse after being outside or at night. Also having acid reflux while on Nexium once daily.   Objective:   BP 130/82 mmHg  Ht 5\' 6"  (1.676 m)  Wt 200 lb 9.6 oz (90.992 kg)  BMI 32.39 kg/m2 NAD. Alert ,oriented. TMs retracted. No erythema. Pharynx injected with clear PND noted. Neck supple with mild anterior adenopathy. Lungs: initially expiratory wheezes throughout lung fields. No tachypnea. Given albuterol 2.5 mg neb treatment. Wheezes resolved. Subjective improvement of symptoms. Heart RRR. Abdomen soft, with epigastric area tenderness.   Assessment:  Problem List Items Addressed This Visit      Respiratory   Allergic rhinitis   Asthma, moderate persistent - Primary   Relevant Medications   predniSONE (DELTASONE) 20 MG tablet   albuterol (PROVENTIL HFA;VENTOLIN HFA) 108 (90 BASE) MCG/ACT inhaler   albuterol (PROVENTIL) (2.5 MG/3ML) 0.083% nebulizer solution    Other Visit Diagnoses    Gastroesophageal reflux disease without esophagitis        Relevant Medications    esomeprazole (NEXIUM) 40 MG capsule      Plan:  Meds ordered this encounter  Medications  . predniSONE (DELTASONE) 20 MG tablet    Sig: 3 po qd x 3 d then 2 po qd x 3 d then 1 po qd x 3 d    Dispense:  18 tablet    Refill:  0    Order Specific Question:  Supervising Provider    Answer:  Mikey Kirschner [[id number]]  . esomeprazole (NEXIUM) 40 MG capsule    Sig: Take 1 capsule (40 mg total) by mouth 2 (two) times daily before a meal.    Dispense:  60 capsule    Refill:  0    Order Specific Question:  Supervising Provider    Answer:  Mikey Kirschner [[id number]]  . albuterol (PROVENTIL  HFA;VENTOLIN HFA) 108 (90 BASE) MCG/ACT inhaler    Sig: Inhale 2 puffs into the lungs every 4 (four) hours as needed for wheezing or shortness of breath.    Dispense:  1 Inhaler    Refill:  2    Order Specific Question:  Supervising Provider    Answer:  Mikey Kirschner [[id number]]  . albuterol (PROVENTIL) (2.5 MG/3ML) 0.083% nebulizer solution    Sig: Use via neb q 4 hrs prn wheezing; may alternate with inhaler    Dispense:  25 vial    Refill:  2    Order Specific Question:  Supervising Provider    Answer:  Mikey Kirschner [[id number]]   Increase Nexium to BID for the next month. Follow up with GI specialist if not resolved. Call back in 4-5 days if wheezing not significantly improved.  Continue flonase and zyrtec.

## 2015-01-11 ENCOUNTER — Other Ambulatory Visit: Payer: Self-pay | Admitting: Family Medicine

## 2015-08-01 DIAGNOSIS — Z79899 Other long term (current) drug therapy: Secondary | ICD-10-CM | POA: Insufficient documentation

## 2015-08-01 DIAGNOSIS — K449 Diaphragmatic hernia without obstruction or gangrene: Secondary | ICD-10-CM | POA: Insufficient documentation

## 2015-08-01 DIAGNOSIS — Z7951 Long term (current) use of inhaled steroids: Secondary | ICD-10-CM | POA: Insufficient documentation

## 2015-08-01 DIAGNOSIS — G8929 Other chronic pain: Secondary | ICD-10-CM | POA: Diagnosis not present

## 2015-08-01 DIAGNOSIS — Z86010 Personal history of colonic polyps: Secondary | ICD-10-CM | POA: Diagnosis not present

## 2015-08-01 DIAGNOSIS — Z87828 Personal history of other (healed) physical injury and trauma: Secondary | ICD-10-CM | POA: Diagnosis not present

## 2015-08-01 DIAGNOSIS — R1013 Epigastric pain: Secondary | ICD-10-CM | POA: Diagnosis present

## 2015-08-02 ENCOUNTER — Emergency Department (HOSPITAL_COMMUNITY)
Admission: EM | Admit: 2015-08-02 | Discharge: 2015-08-02 | Disposition: A | Discharge status: Home / Self Care | Payer: BLUE CROSS/BLUE SHIELD | Attending: Emergency Medicine | Admitting: Emergency Medicine

## 2015-08-02 ENCOUNTER — Encounter (HOSPITAL_COMMUNITY): Payer: Self-pay

## 2015-08-02 DIAGNOSIS — R1013 Epigastric pain: Secondary | ICD-10-CM

## 2015-08-02 DIAGNOSIS — K449 Diaphragmatic hernia without obstruction or gangrene: Secondary | ICD-10-CM

## 2015-08-02 LAB — CBC
HEMATOCRIT: 40.2 % (ref 39.0–52.0)
Hemoglobin: 14.1 g/dL (ref 13.0–17.0)
MCH: 32.9 pg (ref 26.0–34.0)
MCHC: 35.1 g/dL (ref 30.0–36.0)
MCV: 93.7 fL (ref 78.0–100.0)
Platelets: 242 10*3/uL (ref 150–400)
RBC: 4.29 MIL/uL (ref 4.22–5.81)
RDW: 12.4 % (ref 11.5–15.5)
WBC: 8.4 10*3/uL (ref 4.0–10.5)

## 2015-08-02 LAB — COMPREHENSIVE METABOLIC PANEL
ALT: 23 U/L (ref 17–63)
AST: 21 U/L (ref 15–41)
Albumin: 4 g/dL (ref 3.5–5.0)
Alkaline Phosphatase: 66 U/L (ref 38–126)
Anion gap: 8 (ref 5–15)
BUN: 16 mg/dL (ref 6–20)
CO2: 29 mmol/L (ref 22–32)
CREATININE: 1.24 mg/dL (ref 0.61–1.24)
Calcium: 9.3 mg/dL (ref 8.9–10.3)
Chloride: 101 mmol/L (ref 101–111)
GFR calc Af Amer: >60 mL/min (ref >60)
GFR calc non Af Amer: >60 mL/min (ref >60)
Glucose, Bld: 97 mg/dL (ref 65–99)
Potassium: 4.1 mmol/L (ref 3.5–5.1)
SODIUM: 138 mmol/L (ref 135–145)
Total Bilirubin: 0.6 mg/dL (ref 0.3–1.2)
Total Protein: 7.4 g/dL (ref 6.5–8.1)

## 2015-08-02 LAB — LIPASE, BLOOD: LIPASE: 42 U/L (ref 11–51)

## 2015-08-02 MED ORDER — GI COCKTAIL ~~LOC~~
30.0000 mL | Freq: Once | ORAL | Status: AC
  Administered 2015-08-02: 30 mL via ORAL
  Filled 2015-08-02: qty 30

## 2015-08-02 MED ORDER — ESOMEPRAZOLE MAGNESIUM 20 MG PO TBEC
40.0000 mg | DELAYED_RELEASE_TABLET | Freq: Every day | ORAL | Status: DC
Start: 2015-08-02 — End: 2016-01-01

## 2015-08-02 MED ORDER — SUCRALFATE 1 G PO TABS: 1.0000 g | ORAL_TABLET | Freq: Three times a day (TID) | ORAL | Status: DC

## 2015-08-02 MED ORDER — PANTOPRAZOLE SODIUM 40 MG PO TBEC
40.0000 mg | DELAYED_RELEASE_TABLET | Freq: Once | ORAL | Status: AC
  Administered 2015-08-02: 40 mg via ORAL
  Filled 2015-08-02: qty 1

## 2015-08-02 MED ORDER — ONDANSETRON 4 MG PO TBDP
4.0000 mg | ORAL_TABLET | Freq: Once | ORAL | Status: AC
  Administered 2015-08-02: 4 mg via ORAL
  Filled 2015-08-02: qty 1

## 2015-08-02 NOTE — ED Notes (Signed)
Discharge instructions given, pt demonstrated teach back and verbal understanding. No concerns voiced.  

## 2015-08-02 NOTE — Discharge Instructions (Signed)
Gastroesophageal Reflux Disease, Adult Normally, food travels down the esophagus and stays in the stomach to be digested. However, when a person has gastroesophageal reflux disease (GERD), food and stomach acid move back up into the esophagus. When this happens, the esophagus becomes sore and inflamed. Over time, GERD can create small holes (ulcers) in the lining of the esophagus.  CAUSES This condition is caused by a problem with the muscle between the esophagus and the stomach (lower esophageal sphincter, or LES). Normally, the LES muscle closes after food passes through the esophagus to the stomach. When the LES is weakened or abnormal, it does not close properly, and that allows food and stomach acid to go back up into the esophagus. The LES can be weakened by certain dietary substances, medicines, and medical conditions, including:  Tobacco use.  Pregnancy.  Having a hiatal hernia.  Heavy alcohol use.  Certain foods and beverages, such as coffee, chocolate, onions, and peppermint. RISK FACTORS This condition is more likely to develop in:  People who have an increased body weight.  People who have connective tissue disorders.  People who use NSAID medicines. SYMPTOMS Symptoms of this condition include:  Heartburn.  Difficult or painful swallowing.  The feeling of having a lump in the throat.  Abitter taste in the mouth.  Bad breath.  Having a large amount of saliva.  Having an upset or bloated stomach.  Belching.  Chest pain.  Shortness of breath or wheezing.  Ongoing (chronic) cough or a night-time cough.  Wearing away of tooth enamel.  Weight loss. Different conditions can cause chest pain. Make sure to see your health care provider if you experience chest pain. DIAGNOSIS Your health care provider will take a medical history and perform a physical exam. To determine if you have mild or severe GERD, your health care provider may also monitor how you respond  to treatment. You may also have other tests, including:  An endoscopy toexamine your stomach and esophagus with a small camera.  A test thatmeasures the acidity level in your esophagus.  A test thatmeasures how much pressure is on your esophagus.  A barium swallow or modified barium swallow to show the shape, size, and functioning of your esophagus. TREATMENT The goal of treatment is to help relieve your symptoms and to prevent complications. Treatment for this condition may vary depending on how severe your symptoms are. Your health care provider may recommend:  Changes to your diet.  Medicine.  Surgery. HOME CARE INSTRUCTIONS Diet  Follow a diet as recommended by your health care provider. This may involve avoiding foods and drinks such as:  Coffee and tea (with or without caffeine).  Drinks that containalcohol.  Energy drinks and sports drinks.  Carbonated drinks or sodas.  Chocolate and cocoa.  Peppermint and mint flavorings.  Garlic and onions.  Horseradish.  Spicy and acidic foods, including peppers, chili powder, curry powder, vinegar, hot sauces, and barbecue sauce.  Citrus fruit juices and citrus fruits, such as oranges, lemons, and limes.  Tomato-based foods, such as red sauce, chili, salsa, and pizza with red sauce.  Fried and fatty foods, such as donuts, french fries, potato chips, and high-fat dressings.  High-fat meats, such as hot dogs and fatty cuts of red and white meats, such as rib eye steak, sausage, ham, and bacon.  High-fat dairy items, such as whole milk, butter, and cream cheese.  Eat small, frequent meals instead of large meals.  Avoid drinking large amounts of liquid with your  meals.  Avoid eating meals during the 2-3 hours before bedtime.  Avoid lying down right after you eat.  Do not exercise right after you eat. General Instructions  Pay attention to any changes in your symptoms.  Take over-the-counter and prescription  medicines only as told by your health care provider. Do not take aspirin, ibuprofen, or other NSAIDs unless your health care provider told you to do so.  Do not use any tobacco products, including cigarettes, chewing tobacco, and e-cigarettes. If you need help quitting, ask your health care provider.  Wear loose-fitting clothing. Do not wear anything tight around your waist that causes pressure on your abdomen.  Raise (elevate) the head of your bed 6 inches (15cm).  Try to reduce your stress, such as with yoga or meditation. If you need help reducing stress, ask your health care provider.  If you are overweight, reduce your weight to an amount that is healthy for you. Ask your health care provider for guidance about a safe weight loss goal.  Keep all follow-up visits as told by your health care provider. This is important. SEEK MEDICAL CARE IF:  You have new symptoms.  You have unexplained weight loss.  You have difficulty swallowing, or it hurts to swallow.  You have wheezing or a persistent cough.  Your symptoms do not improve with treatment.  You have a hoarse voice. SEEK IMMEDIATE MEDICAL CARE IF:  You have pain in your arms, neck, jaw, teeth, or back.  You feel sweaty, dizzy, or light-headed.  You have chest pain or shortness of breath.  You vomit and your vomit looks like blood or coffee grounds.  You faint.  Your stool is bloody or black.  You cannot swallow, drink, or eat.   This information is not intended to replace advice given to you by your health care provider. Make sure you discuss any questions you have with your health care provider.   Document Released: 05/20/2005 Document Revised: 05/01/2015 Document Reviewed: 12/05/2014 Elsevier Interactive Patient Education 2016 [employer]  Hiatal Hernia A hiatal hernia occurs when part of your stomach slides above the muscle that separates your abdomen from your chest (diaphragm). You can be born with a hiatal  hernia (congenital), or it may develop over time. In almost all cases of hiatal hernia, only the top part of the stomach pushes through.  Many people have a hiatal hernia with no symptoms. The larger the hernia, the more likely that you will have symptoms. In some cases, a hiatal hernia allows stomach acid to flow back into the tube that carries food from your mouth to your stomach (esophagus). This may cause heartburn symptoms. Severe heartburn symptoms may mean you have developed a condition called gastroesophageal reflux disease (GERD).  CAUSES  Hiatal hernias are caused by a weakness in the opening (hiatus) where your esophagus passes through your diaphragm to attach to the upper part of your stomach. You may be born with a weakness in your hiatus, or a weakness can develop. RISK FACTORS Older age is a major risk factor for a hiatal hernia. Anything that increases pressure on your diaphragm can also increase your risk of a hiatal hernia. This includes:  Pregnancy.  Excess weight.  Frequent constipation. SIGNS AND SYMPTOMS  People with a hiatal hernia often have no symptoms. If symptoms develop, they are almost always caused by GERD. They may include:  Heartburn.  Belching.  Indigestion.  Trouble swallowing.  Coughing or wheezing.  Sore throat.  Hoarseness.  Chest pain. DIAGNOSIS  A hiatal hernia is sometimes found during an exam for another problem. Your health care provider may suspect a hiatal hernia if you have symptoms of GERD. Tests may be done to diagnose GERD. These may include:  X-rays of your stomach or chest.  An upper gastrointestinal (GI) series. This is an X-ray exam of your GI tract involving the use of a chalky liquid that you swallow. The liquid shows up clearly on the X-ray.  Endoscopy. This is a procedure to look into your stomach using a thin, flexible tube that has a tiny camera and light on the end of it. TREATMENT  If you have no symptoms, you may not  need treatment. If you have symptoms, treatment may include:  Dietary and lifestyle changes to help reduce GERD symptoms.  Medicines. These may include:  Over-the-counter antacids.  Medicines that make your stomach empty more quickly.  Medicines that block the production of stomach acid (H2 blockers).  Stronger medicines to reduce stomach acid (proton pump inhibitors).  You may need surgery to repair the hernia if other treatments are not helping. HOME CARE INSTRUCTIONS   Take all medicines as directed by your health care provider.  Quit smoking, if you smoke.  Try to achieve and maintain a healthy body weight.  Eat frequent small meals instead of three large meals a day. This keeps your stomach from getting too full.  Eat slowly.  Do not lie down right after eating.  Do noteat 1-2 hours before bed.   Do not drink beverages with caffeine. These include cola, coffee, cocoa, and tea.  Do not drink alcohol.  Avoid foods that can make symptoms of GERD worse. These may include:  Fatty foods.  Citrus fruits.  Other foods and drinks that contain acid.  Avoid putting pressure on your belly. Anything that puts pressure on your belly increases the amount of acid that may be pushed up into your esophagus.   Avoid bending over, especially after eating.  Raise the head of your bed by putting blocks under the legs. This keeps your head and esophagus higher than your stomach.  Do not wear tight clothing around your chest or stomach.  Try not to strain when having a bowel movement, when urinating, or when lifting heavy objects. SEEK MEDICAL CARE IF:  Your symptoms are not controlled with medicines or lifestyle changes.  You are having trouble swallowing.  You have coughing or wheezing that will not go away. SEEK IMMEDIATE MEDICAL CARE IF:  Your pain is getting worse.  Your pain spreads to your arms, neck, jaw, teeth, or back.  You have shortness of breath.  You  sweat for no reason.  You feel sick to your stomach (nauseous) or vomit.  You vomit blood.  You have bright red blood in your stools.  You have black, tarry stools.    This information is not intended to replace advice given to you by your health care provider. Make sure you discuss any questions you have with your health care provider.   Document Released: 10/31/2003 Document Revised: 08/31/2014 Document Reviewed: 07/28/2013 Elsevier Interactive Patient Education 2016 [place] for Gastroesophageal Reflux Disease, Adult When you have gastroesophageal reflux disease (GERD), the foods you eat and your eating habits are very important. Choosing the right foods can help ease the discomfort of GERD. WHAT GENERAL GUIDELINES DO I NEED TO FOLLOW?  Choose fruits, vegetables, whole grains, low-fat dairy products, and low-fat meat, fish,  and poultry.  Limit fats such as oils, salad dressings, butter, nuts, and avocado.  Keep a food diary to identify foods that cause symptoms.  Avoid foods that cause reflux. These may be different for different people.  Eat frequent small meals instead of three large meals each day.  Eat your meals slowly, in a relaxed setting.  Limit fried foods.  Cook foods using methods other than frying.  Avoid drinking alcohol.  Avoid drinking large amounts of liquids with your meals.  Avoid bending over or lying down until 2-3 hours after eating. WHAT FOODS ARE NOT RECOMMENDED? The following are some foods and drinks that may worsen your symptoms: Vegetables Tomatoes. Tomato juice. Tomato and spaghetti sauce. Chili peppers. Onion and garlic. Horseradish. Fruits Oranges, grapefruit, and lemon (fruit and juice). Meats High-fat meats, fish, and poultry. This includes hot dogs, ribs, ham, sausage, salami, and bacon. Dairy Whole milk and chocolate milk. Sour cream. Cream. Butter. Ice cream. Cream cheese.  Beverages Coffee and tea, with  or without caffeine. Carbonated beverages or energy drinks. Condiments Hot sauce. Barbecue sauce.  Sweets/Desserts Chocolate and cocoa. Donuts. Peppermint and spearmint. Fats and Oils High-fat foods, including [place] fries and potato chips. Other Vinegar. Strong spices, such as black pepper, white pepper, red pepper, cayenne, curry powder, cloves, ginger, and chili powder. The items listed above may not be a complete list of foods and beverages to avoid. Contact your dietitian for more information.   This information is not intended to replace advice given to you by your health care provider. Make sure you discuss any questions you have with your health care provider.   Document Released: 08/10/2005 Document Revised: 08/31/2014 Document Reviewed: 06/14/2013 Elsevier Interactive Patient Education [employer].

## 2015-08-02 NOTE — ED Notes (Signed)
Upper abd pain started today, denies n/v/d

## 2015-08-02 NOTE — ED Provider Notes (Signed)
TIME SEEN: 12:35 AM  CHIEF COMPLAINT: Epigastric abdominal pain  HPI: Pt is a 24 y.o. male with previous history of ? Gastritis secondary to NSAID use his last endoscopy was in 2012 by Dr. Buford Dresser who presents emergency department with sharp epigastric pain that started tonight and they feel similar to his gastritis. No nausea, vomiting or diarrhea. No bloody stools or melena. Denies NSAID or alcohol use. Was previously on a medication for his gastritis but states he stopped this over a year ago because his pain resolved. He denies any fever. No history of abdominal surgeries. No sick contacts, recent travel, antibiotic use or hospitalization. No aggravating or relieving factors.  ROS: See HPI Constitutional: no fever  Eyes: no drainage  ENT: no runny nose   Cardiovascular:  no chest pain  Resp: no SOB  GI: no vomiting GU: no dysuria Integumentary: no rash  Allergy: no hives  Musculoskeletal: no leg swelling  Neurological: no slurred speech ROS otherwise negative  PAST MEDICAL HISTORY/PAST SURGICAL HISTORY:  Past Medical History  Diagnosis Date  . Medial meniscus tear     needs surgery, Dr. Aline Brochure to perform in near future.   . Stomach problems     related to NSAID use  . Hx of adenomatous colonic polyps 09/2011    Surveillance colonoscopy due 09/2016  . Chronic abdominal pain   . Hiatal hernia     MEDICATIONS:  Prior to Admission medications   Medication Sig Start Date End Date Taking? Authorizing Provider  albuterol (PROVENTIL HFA;VENTOLIN HFA) 108 (90 BASE) MCG/ACT inhaler Inhale 2 puffs into the lungs every 4 (four) hours as needed for wheezing or shortness of breath. 12/07/14   Nilda Simmer, NP  albuterol (PROVENTIL) (2.5 MG/3ML) 0.083% nebulizer solution Use via neb q 4 hrs prn wheezing; may alternate with inhaler 12/07/14   Nilda Simmer, NP  cetirizine (ZYRTEC) 10 MG tablet TAKE 1 TABLET BY MOUTH EVERY DAY 01/11/15   Mikey Kirschner, MD  esomeprazole (NEXIUM) 40 MG  capsule Take 1 capsule (40 mg total) by mouth 2 (two) times daily before a meal. 12/07/14   Nilda Simmer, NP  fluticasone (FLONASE) 50 MCG/ACT nasal spray Place 2 sprays into both nostrils daily. 11/30/13   Mikey Kirschner, MD  predniSONE (DELTASONE) 20 MG tablet 3 po qd x 3 d then 2 po qd x 3 d then 1 po qd x 3 d 12/07/14   Nilda Simmer, NP    ALLERGIES:  No Known Allergies  SOCIAL HISTORY:  Social History  Substance Use Topics  . Smoking status: Never Smoker   . Smokeless tobacco: Not on file  . Alcohol Use: No    FAMILY HISTORY: Family History  Problem Relation Age of Onset  . Diabetes    . Colon cancer Neg Hx     EXAM: BP 151/92 mmHg  Pulse 74  Temp(Src) 97.6 F (36.4 C) (Oral)  Resp 18  Ht 5\' 6"  (1.676 m)  Wt 200 lb (90.719 kg)  BMI 32.30 kg/m2  SpO2 98% CONSTITUTIONAL: Alert and oriented and responds appropriately to questions. Well-appearing; well-nourished; pt sleeping comfortably when I entered the room HEAD: Normocephalic EYES: Conjunctivae slightly injected bilaterally without drainage, PERRL ENT: normal nose; no rhinorrhea; moist mucous membranes; pharynx without lesions noted NECK: Supple, no meningismus, no LAD  CARD: RRR; S1 and S2 appreciated; no murmurs, no clicks, no rubs, no gallops RESP: Normal chest excursion without splinting or tachypnea; breath sounds clear and equal bilaterally;  no wheezes, no rhonchi, no rales, no hypoxia or respiratory distress, speaking full sentences ABD/GI: Normal bowel sounds; non-distended; soft, minimally tender throughout the abdomen but more so in the epigastric region, no rebound, no guarding, no peritoneal signs, no significant tenderness at McBurney's point, negative Murphy sign BACK:  The back appears normal and is non-tender to palpation, there is no CVA tenderness EXT: Normal ROM in all joints; non-tender to palpation; no edema; normal capillary refill; no cyanosis, no calf tenderness or swelling    SKIN:  Normal color for age and race; warm NEURO: Moves all extremities equally, sensation to light touch intact diffusely, cranial nerves II through XII intact PSYCH: The patient's mood and manner are appropriate. Grooming and personal hygiene are appropriate.  MEDICAL DECISION MAKING: Patient here with symptoms similar to gastritis, GERD. Abdominal exam is benign. Patient reports he has had problems with his stomach after NSAID use. Does have a history of a hiatal hernia found on endoscopy in 2013. Will treat with GI cocktail, Protonix, Zofran. Will check labs today. Do not feel he needs emergent abdominal imaging. Doubt cholecystitis, pancreatitis, appendicitis, colitis.  ED PROGRESS: 1:40 AM  Pt reports pain is better. Patient sleeping, snoring when I entered the room. He appears very comfortable with benign exam. I feel he is safe to be discharged home. We'll discharge on Protonix, Carafate and with outpatient GI follow-up. Discussed return precautions. Have advised that he avoid NSAIDs, alcohol, seat, ascitic and greasy foods. He verbalizes understanding and is comfortable with this plan.     [place], DO 08/02/15 [phone]

## 2016-01-01 ENCOUNTER — Ambulatory Visit (INDEPENDENT_AMBULATORY_CARE_PROVIDER_SITE_OTHER): Payer: BLUE CROSS/BLUE SHIELD | Admitting: Family Medicine

## 2016-01-01 ENCOUNTER — Encounter: Payer: Self-pay | Admitting: Family Medicine

## 2016-01-01 VITALS — BP 132/80 | Ht 65.5 in | Wt 207.0 lb

## 2016-01-01 DIAGNOSIS — Z139 Encounter for screening, unspecified: Secondary | ICD-10-CM

## 2016-01-01 DIAGNOSIS — Z0000 Encounter for general adult medical examination without abnormal findings: Secondary | ICD-10-CM

## 2016-01-01 MED ORDER — LORATADINE 10 MG PO TABS: 10.0000 mg | ORAL_TABLET | Freq: Every day | ORAL | Status: DC

## 2016-01-01 MED ORDER — FLUTICASONE PROPIONATE 50 MCG/ACT NA SUSP: 2.0000 | Freq: Every day | NASAL | Status: DC

## 2016-01-01 NOTE — Progress Notes (Signed)
   Subjective:    Patient ID: Frank Hansen, male    DOB: 01-27-1991, 25 y.o.   MRN: [id number]  HPI The patient comes in today for a wellness visit.  Allergy and cong and drana  A review of their health history was completed.  A review of medications was also completed.  Any needed refills; requesting rx for allergies and flonase  Eating habits: health conscious  Falls/  MVA accidents in past few months: none  Regular exercise: running and weights  Specialist pt sees on regular basis: Dr. Sydell Axon  Preventative health issues were discussed.   Additional concerns: wants rx for allergy med and flonase. , Patient does have substantial allergies and would like a refill on allergy tablets along with Flonase  Working at [employer] in [place],  Staying physicall y active   Non smoking   Diet so so   Pt has twins        Review of Systems  Constitutional: Negative for fever, activity change and appetite change.  HENT: Negative for congestion and rhinorrhea.   Eyes: Negative for discharge.  Respiratory: Negative for cough and wheezing.   Cardiovascular: Negative for chest pain.  Gastrointestinal: Negative for vomiting, abdominal pain and blood in stool.  Genitourinary: Negative for frequency and difficulty urinating.  Musculoskeletal: Negative for neck pain.  Skin: Negative for rash.  Allergic/Immunologic: Negative for environmental allergies and food allergies.  Neurological: Negative for weakness and headaches.  Psychiatric/Behavioral: Negative for agitation.  All other systems reviewed and are negative.      Objective:   Physical Exam  Constitutional: He appears well-developed and well-nourished.  HENT:  Head: Normocephalic and atraumatic.  Right Ear: External ear normal.  Left Ear: External ear normal.  Nose: Nose normal.  Mouth/Throat: Oropharynx is clear and moist.  Eyes: EOM are normal. Pupils are equal, round, and reactive to light.  Neck: Normal range of  motion. Neck supple. No thyromegaly present.  Cardiovascular: Normal rate, regular rhythm and normal heart sounds.   No murmur heard. Pulmonary/Chest: Effort normal and breath sounds normal. No respiratory distress. He has no wheezes.  Abdominal: Soft. Bowel sounds are normal. He exhibits no distension and no mass. There is no tenderness.  Genitourinary: Penis normal.  Musculoskeletal: Normal range of motion. He exhibits no edema.  Lymphadenopathy:    He has no cervical adenopathy.  Neurological: He is alert. He exhibits normal muscle tone.  Skin: Skin is warm and dry. No erythema.  Psychiatric: He has a normal mood and affect. His behavior is normal. Judgment normal.          Assessment & Plan:  Impression well adult exam #2 allergic rhinitis discussed will treat plan appropriate blood work. Diet exercise discussed general questions answered further recommendations based on blood work [employer]

## 2016-01-11 ENCOUNTER — Encounter (HOSPITAL_COMMUNITY): Payer: Self-pay | Admitting: Emergency Medicine

## 2016-01-11 ENCOUNTER — Emergency Department (HOSPITAL_COMMUNITY)
Admission: EM | Admit: 2016-01-11 | Discharge: 2016-01-11 | Disposition: A | Discharge status: Home / Self Care | Payer: BLUE CROSS/BLUE SHIELD | Attending: Emergency Medicine | Admitting: Emergency Medicine

## 2016-01-11 DIAGNOSIS — R079 Chest pain, unspecified: Secondary | ICD-10-CM | POA: Diagnosis not present

## 2016-01-11 DIAGNOSIS — J45901 Unspecified asthma with (acute) exacerbation: Secondary | ICD-10-CM | POA: Diagnosis not present

## 2016-01-11 DIAGNOSIS — R0602 Shortness of breath: Secondary | ICD-10-CM | POA: Diagnosis present

## 2016-01-11 HISTORY — DX: Unspecified asthma, uncomplicated: J45.909

## 2016-01-11 MED ORDER — IPRATROPIUM BROMIDE 0.02 % IN SOLN
0.5000 mg | Freq: Once | RESPIRATORY_TRACT | Status: AC
  Administered 2016-01-11: 0.5 mg via RESPIRATORY_TRACT
  Filled 2016-01-11: qty 2.5

## 2016-01-11 MED ORDER — PREDNISONE 10 MG PO TABS
60.0000 mg | ORAL_TABLET | Freq: Once | ORAL | Status: AC
  Administered 2016-01-11: 60 mg via ORAL
  Filled 2016-01-11: qty 1

## 2016-01-11 MED ORDER — ALBUTEROL SULFATE (2.5 MG/3ML) 0.083% IN NEBU: 2.5000 mg | INHALATION_SOLUTION | Freq: Once | RESPIRATORY_TRACT | Status: DC

## 2016-01-11 MED ORDER — PREDNISONE 20 MG PO TABS: 60.0000 mg | ORAL_TABLET | Freq: Every day | ORAL | Status: DC

## 2016-01-11 MED ORDER — ALBUTEROL (5 MG/ML) CONTINUOUS INHALATION SOLN
10.0000 mg/h | INHALATION_SOLUTION | Freq: Once | RESPIRATORY_TRACT | Status: AC
  Administered 2016-01-11: 10 mg/h via RESPIRATORY_TRACT
  Filled 2016-01-11: qty 20

## 2016-01-11 MED ORDER — IPRATROPIUM-ALBUTEROL 0.5-2.5 (3) MG/3ML IN SOLN: 3.0000 mL | Freq: Once | RESPIRATORY_TRACT | Status: DC

## 2016-01-11 NOTE — Discharge Instructions (Signed)

## 2016-01-11 NOTE — ED Notes (Signed)
Pt states he began having CP and SOB last night. Has hx of asthma, used rescue inhaler yesterday without relief. States CP radiates down left arm, SOB, N/V/. Pt speaking in short sentences at this time, wheezing noted.

## 2016-01-11 NOTE — ED Provider Notes (Signed)
CSN: [id number]     Arrival date & time 01/11/16  [id number] History   First MD Initiated Contact with Patient 01/11/16 [phone]     Chief Complaint  Patient presents with  . Shortness of Breath  . Chest Pain     Level V caveat due to shortness of breath. Patient has his family member answer all the questions minus him not in yes or no to some questions. Patient is a 25 y.o. male presenting with shortness of breath and chest pain.  Shortness of Breath Associated symptoms: chest pain   Chest Pain Associated symptoms: shortness of breath   Patient presents with shortness of breath. Began last night. Also some chest tightness. History of asthma and this feels like his asthma. No fevers. Has had occasional cough without sputum production. No relief with his inhalers at home. No previous intubations for his asthma and will rarely have to come to the hospital.  Past Medical History  Diagnosis Date  . Medial meniscus tear     needs surgery, Dr. Aline Brochure to perform in near future.   . Stomach problems     related to NSAID use  . Hx of adenomatous colonic polyps 09/2011    Surveillance colonoscopy due 09/2016  . Chronic abdominal pain   . Hiatal hernia   . Asthma    Past Surgical History  Procedure Laterality Date  . Acl graft and medial meniscus repair  12/12/10    left, needs repeat surgery for medial meniscus tear in near future, graft intact  . Colonoscopy  09/2011    [id number] rectal polyp-tubular adenoma. Normal TI. Next TCS in 09/2016.  Marland Kitchen Esophagogastroduodenoscopy  09/2011    [id number] hiatal hernia otherwise negative exam.   . Flexible sigmoidoscopy N/A 07/13/2013    RMR: Minimal rectal mucosal abnormality consistent more with enema tip trauma than anything else.  No significant findings consistent with inflammatory bowel disease or other abnormality, S/p rectal bx.The sigmoidoscopy revealed otherwise normal   Family History  Problem Relation Age of Onset  . Diabetes    . Colon cancer Neg  Hx    Social History  Substance Use Topics  . Smoking status: Never Smoker   . Smokeless tobacco: None  . Alcohol Use: No    Review of Systems  Unable to perform ROS: Severe respiratory distress  Respiratory: Positive for shortness of breath.   Cardiovascular: Positive for chest pain.      Allergies  Review of patient's allergies indicates no known allergies.  Home Medications   Prior to Admission medications   Medication Sig Start Date End Date Taking? Authorizing Provider  albuterol (PROVENTIL HFA;VENTOLIN HFA) 108 (90 BASE) MCG/ACT inhaler Inhale 2 puffs into the lungs every 4 (four) hours as needed for wheezing or shortness of breath. 12/07/14   Nilda Simmer, NP  albuterol (PROVENTIL) (2.5 MG/3ML) 0.083% nebulizer solution Use via neb q 4 hrs prn wheezing; may alternate with inhaler 12/07/14   Nilda Simmer, NP  fluticasone (FLONASE) 50 MCG/ACT nasal spray Place 2 sprays into both nostrils daily. 01/01/16   Mikey Kirschner, MD  loratadine (CLARITIN) 10 MG tablet Take 1 tablet (10 mg total) by mouth daily. 01/01/16   Mikey Kirschner, MD  predniSONE (DELTASONE) 20 MG tablet Take 3 tablets (60 mg total) by mouth daily. 01/12/16   Davonna Belling, MD   BP 120/72 mmHg  Pulse 79  Temp(Src) 97.9 F (36.6 C)  Resp 17  SpO2 97% Physical Exam  Constitutional: He  appears well-developed.  HENT:  Head: Atraumatic.  Neck: Neck supple.  Pulmonary/Chest: He is in respiratory distress. He has wheezes. He has no rales.  Diffuse wheezes and prolonged expirations.  Abdominal: Soft.  Musculoskeletal: Normal range of motion.  Neurological: He is alert.  Skin: Skin is warm.    ED Course  Procedures (including critical care time) Labs Review Labs Reviewed - No data to display  Imaging Review No results found. I have personally reviewed and evaluated these images and lab results as part of my medical decision-making.   EKG Interpretation   Date/Time:  Saturday Jan 11 2016 06:59:49 EDT Ventricular Rate:  85 PR Interval:  181 QRS Duration: 101 QT Interval:  358 QTC Calculation: 426 R Axis:   -64 Text Interpretation:  Sinus rhythm Left axis deviation RSR' in V1 or V2,  probably normal variant ST elev, probable normal early repol pattern  Baseline wander in lead(s) V6 Confirmed by Alvino Chapel  MD, Margarine Grosshans [phone])  on 01/11/2016 7:06:43 AM      MDM   Final diagnoses:  Asthma, unspecified asthma severity, with acute exacerbation    Patient shortness of breath. Likely asthma exacerbation. Feels much better after treatment. Will discharge home with short course of steroids.    Davonna Belling, MD 01/11/16 [phone]

## 2016-01-11 NOTE — Progress Notes (Signed)
Patient off cat not wheezing

## 2016-07-01 ENCOUNTER — Emergency Department (HOSPITAL_COMMUNITY): Payer: BLUE CROSS/BLUE SHIELD

## 2016-07-01 ENCOUNTER — Emergency Department (HOSPITAL_COMMUNITY)
Admission: EM | Admit: 2016-07-01 | Discharge: 2016-07-01 | Disposition: A | Discharge status: Home / Self Care | Payer: BLUE CROSS/BLUE SHIELD | Attending: Emergency Medicine | Admitting: Emergency Medicine

## 2016-07-01 ENCOUNTER — Encounter (HOSPITAL_COMMUNITY): Payer: Self-pay | Admitting: Emergency Medicine

## 2016-07-01 DIAGNOSIS — Y9367 Activity, basketball: Secondary | ICD-10-CM | POA: Diagnosis not present

## 2016-07-01 DIAGNOSIS — S8264XA Nondisplaced fracture of lateral malleolus of right fibula, initial encounter for closed fracture: Secondary | ICD-10-CM | POA: Insufficient documentation

## 2016-07-01 DIAGNOSIS — Y999 Unspecified external cause status: Secondary | ICD-10-CM | POA: Diagnosis not present

## 2016-07-01 DIAGNOSIS — X501XXA Overexertion from prolonged static or awkward postures, initial encounter: Secondary | ICD-10-CM | POA: Diagnosis not present

## 2016-07-01 DIAGNOSIS — J4540 Moderate persistent asthma, uncomplicated: Secondary | ICD-10-CM | POA: Diagnosis not present

## 2016-07-01 DIAGNOSIS — Z79899 Other long term (current) drug therapy: Secondary | ICD-10-CM | POA: Insufficient documentation

## 2016-07-01 DIAGNOSIS — Y929 Unspecified place or not applicable: Secondary | ICD-10-CM | POA: Insufficient documentation

## 2016-07-01 DIAGNOSIS — S99911A Unspecified injury of right ankle, initial encounter: Secondary | ICD-10-CM | POA: Diagnosis present

## 2016-07-01 MED ORDER — OMEPRAZOLE 20 MG PO CPDR: 20.0000 mg | DELAYED_RELEASE_CAPSULE | Freq: Every day | ORAL | 0 refills | Status: DC

## 2016-07-01 MED ORDER — NAPROXEN 500 MG PO TABS: 500.0000 mg | ORAL_TABLET | Freq: Two times a day (BID) | ORAL | 1 refills | Status: DC

## 2016-07-01 NOTE — ED Provider Notes (Signed)
[facility] DEPT Provider Note   CSN: [id number] Arrival date & time: 07/01/16  1811     History   Chief Complaint Chief Complaint  Patient presents with  . Ankle Injury    HPI Frank Hansen is a 25 y.o. male.  Frank Hansen is a 25 y.o. Male who presents to the ED complaining of right lateral ankle pain after an injury while playing basketball today. Patient reports he is playing basketball when someone fell on his right ankle causing his right ankle to twist. He reports pain to the lateral aspect of his right ankle. Pain is worse with movement or weightbearing.  No treatments prior to arrival. He denies other injury. He denies fevers, numbness, tingling or weakness.   The history is provided by the patient. No language interpreter was used.  Ankle Injury     Past Medical History:  Diagnosis Date  . Asthma   . Chronic abdominal pain   . Hiatal hernia   . Hx of adenomatous colonic polyps 09/2011   Surveillance colonoscopy due 09/2016  . Medial meniscus tear    needs surgery, Dr. Aline Brochure to perform in near future.   . Stomach problems    related to NSAID use    Patient Active Problem List   Diagnosis Date Noted  . Asthma, moderate persistent 12/09/2014  . Gastroesophageal reflux disease without esophagitis 12/09/2014  . Diarrhea 10/15/2014  . Allergic rhinitis 11/30/2013  . Heartburn 06/22/2013  . RLQ abdominal pain 06/22/2013  . Personal history of colonic polyps 11/23/2012  . Cardiomegaly 11/23/2012  . S/P left knee arthroscopy 11/16/2011  . Rectal bleeding 10/08/2011  . Abdominal pain, epigastric 10/08/2011  . Torn meniscus 09/02/2011  . KNEE SPRAIN, RIGHT 05/26/2011  . MEDIAL MENISCUS TEAR, ACUTE 10/30/2010  . ACL TEAR, LEFT KNEE 10/30/2010  . RHEUMATOID ARTHRITIS 11/05/2009  . KNEE SPRAIN, RIGHT 11/05/2009    Past Surgical History:  Procedure Laterality Date  . ACL graft and medial meniscus repair  12/12/10   left, needs repeat surgery for  medial meniscus tear in near future, graft intact  . COLONOSCOPY  09/2011   [id number] rectal polyp-tubular adenoma. Normal TI. Next TCS in 09/2016.  Marland Kitchen ESOPHAGOGASTRODUODENOSCOPY  09/2011   [id number] hiatal hernia otherwise negative exam.   . FLEXIBLE SIGMOIDOSCOPY N/A 07/13/2013   RMR: Minimal rectal mucosal abnormality consistent more with enema tip trauma than anything else.  No significant findings consistent with inflammatory bowel disease or other abnormality, S/p rectal bx.The sigmoidoscopy revealed otherwise normal       Home Medications    Prior to Admission medications   Medication Sig Start Date End Date Taking? Authorizing Provider  albuterol (PROVENTIL HFA;VENTOLIN HFA) 108 (90 BASE) MCG/ACT inhaler Inhale 2 puffs into the lungs every 4 (four) hours as needed for wheezing or shortness of breath. 12/07/14   Nilda Simmer, NP  albuterol (PROVENTIL) (2.5 MG/3ML) 0.083% nebulizer solution Use via neb q 4 hrs prn wheezing; may alternate with inhaler 12/07/14   Nilda Simmer, NP  fluticasone (FLONASE) 50 MCG/ACT nasal spray Place 2 sprays into both nostrils daily. 01/01/16   Mikey Kirschner, MD  loratadine (CLARITIN) 10 MG tablet Take 1 tablet (10 mg total) by mouth daily. 01/01/16   Mikey Kirschner, MD  naproxen (NAPROSYN) 500 MG tablet Take 1 tablet (500 mg total) by mouth 2 (two) times daily with a meal. 07/01/16   Waynetta Pean, PA-C  omeprazole (PRILOSEC) 20 MG capsule Take 1 capsule (20 mg  total) by mouth daily. 07/01/16   Waynetta Pean, PA-C  predniSONE (DELTASONE) 20 MG tablet Take 3 tablets (60 mg total) by mouth daily. 01/12/16   Davonna Belling, MD    Family History Family History  Problem Relation Age of Onset  . Diabetes    . Colon cancer Neg Hx     Social History Social History  Substance Use Topics  . Smoking status: Never Smoker  . Smokeless tobacco: Not on file  . Alcohol use No     Allergies   Patient has no known allergies.   Review of  Systems Review of Systems  Constitutional: Negative for fever.  Musculoskeletal: Positive for arthralgias and joint swelling.  Skin: Negative for rash and wound.  Neurological: Negative for weakness and numbness.     Physical Exam Updated Vital Signs BP 134/77 (BP Location: Left Arm)   Pulse 87   Temp 100.3 F (37.9 C) (Temporal)   Resp 16   Ht 5\' 6"  (1.676 m)   Wt 108.9 kg   SpO2 97%   BMI 38.74 kg/m   Physical Exam  Constitutional: He appears well-developed and well-nourished. No distress.  HENT:  Head: Normocephalic and atraumatic.  Eyes: Right eye exhibits no discharge. Left eye exhibits no discharge.  Cardiovascular: Normal rate, regular rhythm and intact distal pulses.   Bilateral dorsalis pedis and posterior tibialis pulses are intact. Good capillary refill to his bilateral distal toes  Pulmonary/Chest: Effort normal. No respiratory distress.  Musculoskeletal: He exhibits edema and tenderness. He exhibits no deformity.  Tenderness and edema noted to the lateral aspect of his right ankle. No ankle deformity or laxity noted. No tenderness to his right knee or hip.  Neurological: He is alert. Coordination normal.  Sensation is intact in his bilateral lower extremities.  Skin: Skin is warm and dry. Capillary refill takes less than 2 seconds. No rash noted. He is not diaphoretic. No erythema. No pallor.  Good capillary refill to his right distal toes.  Psychiatric: He has a normal mood and affect. His behavior is normal.  Nursing note and vitals reviewed.    ED Treatments / Results  Labs (all labs ordered are listed, but only abnormal results are displayed) Labs Reviewed - No data to display  EKG  EKG Interpretation None       Radiology Dg Ankle Complete Right  Result Date: 07/01/2016 CLINICAL DATA:  Pain following twisting injury EXAM: RIGHT ANKLE - COMPLETE 3+ VIEW COMPARISON:  None. FINDINGS: Frontal, oblique, and lateral views were obtained. There is soft  tissue swelling laterally. There is a subtle transverse lucency in the distal fibular metaphysis region, concerning for a subtle nondisplaced fracture. No other evidence of potential fracture. Ankle mortise appears intact. There is an accessory ossicle lateral to the talus. No appreciable joint space narrowing. IMPRESSION: Subtle transverse lucency in the distal fibular metaphysis with soft tissue swelling laterally. Concern for subtle nondisplaced fracture. No other evidence of potential fracture. Ankle mortise appears intact. Electronically Signed   By: Lowella Grip III M.D.   On: 07/01/2016 18:50    Procedures Procedures (including critical care time)  Medications Ordered in ED Medications - No data to display   Initial Impression / Assessment and Plan / ED Course  I have reviewed the triage vital signs and the nursing notes.  Pertinent labs & imaging results that were available during my care of the patient were reviewed by me and considered in my medical decision making (see chart for details).  Clinical Course    This  is a 25 y.o. Male who presents to the ED complaining of right lateral ankle pain after an injury while playing basketball today. Patient reports he is playing basketball when someone fell on his right ankle causing his right ankle to twist. He reports pain to the lateral aspect of his right ankle. Patient has tenderness and edema noted to the lateral aspect of his right ankle. He is neurovascularly intact. X-ray shows a subtle transverse lucency in the distal fibular metaphysis with soft tissue swelling laterally. It is concerning for a subtle nondisplaced fracture. We'll treat the patient for a nondisplaced fracture of the lateral malleolus of his right fibula. We'll place in a posterior splint and have him nonweightbearing with crutches until he can follow-up with orthopedic surgeon Dr. Aline Brochure. Naproxen for pain control. I discussed return precautions. I discussed  splint care and precautions. I advised the patient to follow-up with their primary care provider this week. I advised the patient to return to the emergency department with new or worsening symptoms or new concerns. The patient verbalized understanding and agreement with plan.      Final Clinical Impressions(s) / ED Diagnoses   Final diagnoses:  Closed nondisplaced fracture of lateral malleolus of right fibula, initial encounter    New Prescriptions New Prescriptions   NAPROXEN (NAPROSYN) 500 MG TABLET    Take 1 tablet (500 mg total) by mouth 2 (two) times daily with a meal.   OMEPRAZOLE (PRILOSEC) 20 MG CAPSULE    Take 1 capsule (20 mg total) by mouth daily.     Waynetta Pean, PA-C 07/01/16 1916    Varney Biles, MD 07/02/16 1326

## 2016-07-01 NOTE — ED Triage Notes (Signed)
Pt reports playing basketball today and another player stepping on right foot, causing right ankle to twist.  Pt ambulatory at this time.

## 2016-07-02 ENCOUNTER — Encounter: Payer: Self-pay | Admitting: Orthopaedic Surgery

## 2016-07-02 ENCOUNTER — Ambulatory Visit (INDEPENDENT_AMBULATORY_CARE_PROVIDER_SITE_OTHER): Payer: BLUE CROSS/BLUE SHIELD | Admitting: Orthopaedic Surgery

## 2016-07-02 VITALS — BP 119/78 | HR 76 | Ht 65.5 in

## 2016-07-02 DIAGNOSIS — S8264XA Nondisplaced fracture of lateral malleolus of right fibula, initial encounter for closed fracture: Secondary | ICD-10-CM | POA: Diagnosis not present

## 2016-07-02 MED ORDER — HYDROCODONE-ACETAMINOPHEN 5-325 MG PO TABS
1.0000 | ORAL_TABLET | Freq: Four times a day (QID) | ORAL | 0 refills | Status: DC | PRN
Start: 2016-07-02 — End: 2016-07-30

## 2016-07-02 NOTE — Progress Notes (Signed)
Subjective: I broke my ankle on the right    Patient ID: Frank Hansen, male    DOB: November 02, 1990, 25 y.o.   MRN: [id number]  HPI He was playing basketball yesterday and twisted his right ankle.  He had pain and swelling.  He went to the ER and x-rays were done.  It showed: IMPRESSION: Subtle transverse lucency in the distal fibular metaphysis with soft tissue swelling laterally. Concern for subtle nondisplaced fracture. No other evidence of potential fracture. Ankle mortise appears Intact.  He was given a posterior splint and crutches.  He has no other injury.   Review of Systems  HENT: Negative for congestion.   Respiratory: Positive for shortness of breath. Negative for cough.   Cardiovascular: Negative for chest pain and leg swelling.  Endocrine: Negative for cold intolerance.  Musculoskeletal: Positive for arthralgias, gait problem and joint swelling.  Allergic/Immunologic: Negative for environmental allergies.   Past Medical History:  Diagnosis Date  . Asthma   . Chronic abdominal pain   . Hiatal hernia   . Hx of adenomatous colonic polyps 09/2011   Surveillance colonoscopy due 09/2016  . Medial meniscus tear    needs surgery, Dr. Aline Brochure to perform in near future.   . Stomach problems    related to NSAID use    Past Surgical History:  Procedure Laterality Date  . ACL graft and medial meniscus repair  12/12/10   left, needs repeat surgery for medial meniscus tear in near future, graft intact  . COLONOSCOPY  09/2011   [id number] rectal polyp-tubular adenoma. Normal TI. Next TCS in 09/2016.  Marland Kitchen ESOPHAGOGASTRODUODENOSCOPY  09/2011   [id number] hiatal hernia otherwise negative exam.   . FLEXIBLE SIGMOIDOSCOPY N/A 07/13/2013   RMR: Minimal rectal mucosal abnormality consistent more with enema tip trauma than anything else.  No significant findings consistent with inflammatory bowel disease or other abnormality, S/p rectal bx.The sigmoidoscopy revealed otherwise normal     Current Outpatient Prescriptions on File Prior to Visit  Medication Sig Dispense Refill  . albuterol (PROVENTIL HFA;VENTOLIN HFA) 108 (90 BASE) MCG/ACT inhaler Inhale 2 puffs into the lungs every 4 (four) hours as needed for wheezing or shortness of breath. 1 Inhaler 2  . albuterol (PROVENTIL) (2.5 MG/3ML) 0.083% nebulizer solution Use via neb q 4 hrs prn wheezing; may alternate with inhaler 25 vial 2  . fluticasone (FLONASE) 50 MCG/ACT nasal spray Place 2 sprays into both nostrils daily. 16 g 3  . loratadine (CLARITIN) 10 MG tablet Take 1 tablet (10 mg total) by mouth daily. 30 tablet 3  . naproxen (NAPROSYN) 500 MG tablet Take 1 tablet (500 mg total) by mouth 2 (two) times daily with a meal. 30 tablet 1  . omeprazole (PRILOSEC) 20 MG capsule Take 1 capsule (20 mg total) by mouth daily. 30 capsule 0  . predniSONE (DELTASONE) 20 MG tablet Take 3 tablets (60 mg total) by mouth daily. 9 tablet 0  . [DISCONTINUED] cetirizine (ZYRTEC) 10 MG tablet TAKE 1 TABLET BY MOUTH EVERY DAY 30 tablet 5   No current facility-administered medications on file prior to visit.     Social History   Social History  . Marital status: Single    Spouse name: N/A  . Number of children: 0  . Years of education: N/A   Occupational History  . student     [employer]   .  Not Employed   Social History Main Topics  . Smoking status: Never Smoker  .  Smokeless tobacco: Not on file  . Alcohol use No  . Drug use: No  . Sexual activity: Not on file   Other Topics Concern  . Not on file   Social History Narrative  . No narrative on file    Family History  Problem Relation Age of Onset  . Diabetes    . Colon cancer Neg Hx     BP 119/78   Pulse 76   Ht 5' 5.5" (1.664 m)   BMI 39.33 kg/m      Objective:   Physical Exam  Constitutional: He is oriented to person, place, and time. He appears well-developed and well-nourished.  HENT:  Head: Normocephalic and  atraumatic.  Eyes: Conjunctivae and EOM are normal. Pupils are equal, round, and reactive to light.  Neck: Normal range of motion. Neck supple.  Cardiovascular: Normal rate, regular rhythm and intact distal pulses.   Pulmonary/Chest: Effort normal.  Abdominal: Soft.  Musculoskeletal: He exhibits tenderness (Pain rigtht ankle with swelling laterally and ecchymosis and decreaed ROM.  NV intact.  Left ankle normal.).  Neurological: He is alert and oriented to person, place, and time. He has normal reflexes. No cranial nerve deficit. He exhibits normal muscle tone. Coordination normal.  Skin: Skin is warm and dry.  Psychiatric: He has a normal mood and affect. His behavior is normal. Judgment and thought content normal.          Assessment & Plan:   Encounter Diagnosis  Name Primary?  . Closed nondisplaced fracture of lateral malleolus of right fibula, initial encounter Yes   He is fitted with CAM walker.  Instructions for Contrast Baths given.  Rx for Norco given.  Return in three weeks.  Call if any problem.  X-rays on return.  Electronically Signed Sanjuana Kava, MD 11/9/20179:53 AM

## 2016-07-15 ENCOUNTER — Telehealth: Payer: Self-pay | Admitting: Orthopaedic Surgery

## 2016-07-15 NOTE — Telephone Encounter (Signed)
Copy of Office notes for date of service 07/02/16 faxed to [facility] per request; [employer] (our contracted forms provider) has faxed short-term disability form to this requester also today, 07/15/16.

## 2016-07-23 ENCOUNTER — Ambulatory Visit (INDEPENDENT_AMBULATORY_CARE_PROVIDER_SITE_OTHER): Payer: BLUE CROSS/BLUE SHIELD

## 2016-07-23 ENCOUNTER — Ambulatory Visit (INDEPENDENT_AMBULATORY_CARE_PROVIDER_SITE_OTHER): Payer: BLUE CROSS/BLUE SHIELD | Admitting: Orthopaedic Surgery

## 2016-07-23 ENCOUNTER — Encounter: Payer: Self-pay | Admitting: Orthopaedic Surgery

## 2016-07-23 DIAGNOSIS — S8264XD Nondisplaced fracture of lateral malleolus of right fibula, subsequent encounter for closed fracture with routine healing: Secondary | ICD-10-CM

## 2016-07-23 NOTE — Patient Instructions (Signed)
Out of work 

## 2016-07-23 NOTE — Progress Notes (Signed)
CC:  My ankle is still tender  He had his CAM walker off and was playing with his children and hit his ankle on the right.  He is better now.  He still uses crutches.  NV intact.  He has little swelling of the right ankle but tender over the lateral malleolus tip.  Encounter Diagnosis  Name Primary?  . Closed nondisplaced fracture of lateral malleolus of right fibula with routine healing Yes   X-rays were done today, reported separately.  Continue CAM walker and crutches, weight bear as tolerated.  Return in two weeks.  Call if any problem.  Electronically Signed Sanjuana Kava, MD 11/30/201710:11 AM

## 2016-07-29 ENCOUNTER — Encounter: Payer: Self-pay | Admitting: Family Medicine

## 2016-07-29 ENCOUNTER — Encounter: Payer: Self-pay | Admitting: Orthopaedic Surgery

## 2016-07-29 NOTE — Telephone Encounter (Signed)
You can let the patient know that I looked at the x-rays I do not think he will end up needing surgery. As for the patient seen the x-rays I would recommend he request to look at the x-rays when he follows up with Dr. Cathi Roan patient has seen Dr. Luna Glasgow who recommended a follow-up or he can schedule an office visit with Dr. Richardson Landry next week and request to look at that image. The current my chart application does not have the ability for images to be opened through the electronics at home

## 2016-07-30 ENCOUNTER — Telehealth: Payer: Self-pay | Admitting: Orthopaedic Surgery

## 2016-07-30 ENCOUNTER — Encounter: Payer: Self-pay | Admitting: Orthopaedic Surgery

## 2016-07-30 MED ORDER — HYDROCODONE-ACETAMINOPHEN 5-325 MG PO TABS: 1.0000 | ORAL_TABLET | Freq: Four times a day (QID) | ORAL | 0 refills | Status: DC | PRN

## 2016-07-31 ENCOUNTER — Encounter: Payer: Self-pay | Admitting: Orthopaedic Surgery

## 2016-08-06 ENCOUNTER — Ambulatory Visit (INDEPENDENT_AMBULATORY_CARE_PROVIDER_SITE_OTHER): Payer: BLUE CROSS/BLUE SHIELD

## 2016-08-06 ENCOUNTER — Encounter: Payer: Self-pay | Admitting: Orthopaedic Surgery

## 2016-08-06 ENCOUNTER — Ambulatory Visit (INDEPENDENT_AMBULATORY_CARE_PROVIDER_SITE_OTHER): Payer: BLUE CROSS/BLUE SHIELD | Admitting: Orthopaedic Surgery

## 2016-08-06 DIAGNOSIS — S8264XD Nondisplaced fracture of lateral malleolus of right fibula, subsequent encounter for closed fracture with routine healing: Secondary | ICD-10-CM

## 2016-08-06 NOTE — Progress Notes (Signed)
CC:  My ankle is just a little sore  He has been using the CAM walker.  He has no new trauma. He has little swelling.  NV intact to the right ankle.  ROM is full.  X-rays were done of the right ankle, reported separately.  Encounter Diagnosis  Name Primary?  . Closed nondisplaced fracture of lateral malleolus of right fibula with routine healing, subsequent encounter Yes   I will change him to an Air Cast.  Precautions discussed.  Return in three weeks.  X-rays then out of the splint.  Call if any problems.  Electronically Signed Sanjuana Kava, MD 12/14/20179:12 AM

## 2016-08-12 ENCOUNTER — Encounter: Payer: Self-pay | Admitting: Orthopaedic Surgery

## 2016-08-27 ENCOUNTER — Ambulatory Visit (INDEPENDENT_AMBULATORY_CARE_PROVIDER_SITE_OTHER): Payer: Self-pay | Admitting: Orthopaedic Surgery

## 2016-08-27 ENCOUNTER — Encounter: Payer: Self-pay | Admitting: Orthopaedic Surgery

## 2016-08-27 ENCOUNTER — Ambulatory Visit (INDEPENDENT_AMBULATORY_CARE_PROVIDER_SITE_OTHER): Payer: BLUE CROSS/BLUE SHIELD

## 2016-08-27 DIAGNOSIS — S8264XD Nondisplaced fracture of lateral malleolus of right fibula, subsequent encounter for closed fracture with routine healing: Secondary | ICD-10-CM

## 2016-08-27 NOTE — Progress Notes (Signed)
CC:  My ankle does not hurt  He is doing well post fracture of the right lateral malleolus.  He is walking well with no problem.  NV intact. ROM full of right ankle.  X-rays were done, reported separately.  Encounter Diagnosis  Name Primary?  . Closed nondisplaced fracture of lateral malleolus of right fibula with routine healing Yes   I will see as needed.  Discharge.  Electronically Signed Sanjuana Kava, MD 1/4/20188:39 AM

## 2016-09-03 ENCOUNTER — Encounter: Payer: Self-pay | Admitting: Orthopaedic Surgery

## 2016-09-12 ENCOUNTER — Encounter (HOSPITAL_COMMUNITY): Payer: Self-pay | Admitting: Emergency Medicine

## 2016-09-12 ENCOUNTER — Emergency Department (HOSPITAL_COMMUNITY): Payer: BLUE CROSS/BLUE SHIELD

## 2016-09-12 ENCOUNTER — Emergency Department (HOSPITAL_COMMUNITY)
Admission: EM | Admit: 2016-09-12 | Discharge: 2016-09-12 | Disposition: A | Discharge status: Home / Self Care | Payer: BLUE CROSS/BLUE SHIELD | Attending: Emergency Medicine | Admitting: Emergency Medicine

## 2016-09-12 DIAGNOSIS — S99911A Unspecified injury of right ankle, initial encounter: Secondary | ICD-10-CM | POA: Diagnosis present

## 2016-09-12 DIAGNOSIS — Y929 Unspecified place or not applicable: Secondary | ICD-10-CM | POA: Insufficient documentation

## 2016-09-12 DIAGNOSIS — X501XXA Overexertion from prolonged static or awkward postures, initial encounter: Secondary | ICD-10-CM | POA: Insufficient documentation

## 2016-09-12 DIAGNOSIS — Y9301 Activity, walking, marching and hiking: Secondary | ICD-10-CM | POA: Insufficient documentation

## 2016-09-12 DIAGNOSIS — Y990 Civilian activity done for income or pay: Secondary | ICD-10-CM | POA: Insufficient documentation

## 2016-09-12 DIAGNOSIS — J45909 Unspecified asthma, uncomplicated: Secondary | ICD-10-CM | POA: Diagnosis not present

## 2016-09-12 DIAGNOSIS — S93401A Sprain of unspecified ligament of right ankle, initial encounter: Secondary | ICD-10-CM | POA: Insufficient documentation

## 2016-09-12 DIAGNOSIS — Z79899 Other long term (current) drug therapy: Secondary | ICD-10-CM | POA: Insufficient documentation

## 2016-09-12 NOTE — ED Triage Notes (Signed)
Pt fx ankle 3-4 mo ago and twisted it again today and is having pain in the same ankle.

## 2016-09-12 NOTE — ED Provider Notes (Signed)
[facility] DEPT Provider Note   CSN: [id number] Arrival date & time: 09/12/16  1505     History   Chief Complaint Chief Complaint  Patient presents with  . Ankle Pain    HPI Frank Hansen is a 26 y.o. male.  The history is provided by the patient.  Ankle Pain   The incident occurred 3 to 5 hours ago. The incident occurred at work. Injury mechanism: twisted ankle while walking down steps. The pain is present in the right ankle. The pain is moderate. The pain has been constant since onset. Pertinent negatives include no numbness, no loss of motion and no loss of sensation. He reports no foreign bodies present. He has tried nothing for the symptoms. The treatment provided no relief.   Previous fracture of the right fibula in 06/2016.   Past Medical History:  Diagnosis Date  . Asthma   . Chronic abdominal pain   . Hiatal hernia   . Hx of adenomatous colonic polyps 09/2011   Surveillance colonoscopy due 09/2016  . Medial meniscus tear    needs surgery, Dr. Aline Brochure to perform in near future.   . Stomach problems    related to NSAID use    Patient Active Problem List   Diagnosis Date Noted  . Asthma, moderate persistent 12/09/2014  . Gastroesophageal reflux disease without esophagitis 12/09/2014  . Diarrhea 10/15/2014  . Allergic rhinitis 11/30/2013  . Heartburn 06/22/2013  . RLQ abdominal pain 06/22/2013  . Personal history of colonic polyps 11/23/2012  . Cardiomegaly 11/23/2012  . S/P left knee arthroscopy 11/16/2011  . Rectal bleeding 10/08/2011  . Abdominal pain, epigastric 10/08/2011  . Torn meniscus 09/02/2011  . KNEE SPRAIN, RIGHT 05/26/2011  . MEDIAL MENISCUS TEAR, ACUTE 10/30/2010  . ACL TEAR, LEFT KNEE 10/30/2010  . RHEUMATOID ARTHRITIS 11/05/2009  . KNEE SPRAIN, RIGHT 11/05/2009    Past Surgical History:  Procedure Laterality Date  . ACL graft and medial meniscus repair  12/12/10   left, needs repeat surgery for medial meniscus tear in near  future, graft intact  . COLONOSCOPY  09/2011   [id number] rectal polyp-tubular adenoma. Normal TI. Next TCS in 09/2016.  Marland Kitchen ESOPHAGOGASTRODUODENOSCOPY  09/2011   [id number] hiatal hernia otherwise negative exam.   . FLEXIBLE SIGMOIDOSCOPY N/A 07/13/2013   RMR: Minimal rectal mucosal abnormality consistent more with enema tip trauma than anything else.  No significant findings consistent with inflammatory bowel disease or other abnormality, S/p rectal bx.The sigmoidoscopy revealed otherwise normal       Home Medications    Prior to Admission medications   Medication Sig Start Date End Date Taking? Authorizing Provider  albuterol (PROVENTIL HFA;VENTOLIN HFA) 108 (90 BASE) MCG/ACT inhaler Inhale 2 puffs into the lungs every 4 (four) hours as needed for wheezing or shortness of breath. 12/07/14  Yes Nilda Simmer, NP  albuterol (PROVENTIL) (2.5 MG/3ML) 0.083% nebulizer solution Use via neb q 4 hrs prn wheezing; may alternate with inhaler 12/07/14  Yes Nilda Simmer, NP    Family History Family History  Problem Relation Age of Onset  . Diabetes    . Colon cancer Neg Hx     Social History Social History  Substance Use Topics  . Smoking status: Never Smoker  . Smokeless tobacco: Not on file  . Alcohol use No     Allergies   Patient has no known allergies.   Review of Systems Review of Systems  Neurological: Negative for numbness.  Ten systems are reviewed and are  negative for acute change except as noted in the HPI    Physical Exam Updated Vital Signs BP (!) 113/54   Pulse 90   Temp 98.4 F (36.9 C) (Oral)   Resp 16   Ht 5\' 6"  (1.676 m)   Wt 220 lb (99.8 kg)   SpO2 97%   BMI 35.51 kg/m   Physical Exam  Constitutional: He is oriented to person, place, and time. He appears well-developed and well-nourished. No distress.  HENT:  Head: Normocephalic and atraumatic.  Right Ear: External ear normal.  Left Ear: External ear normal.  Nose: Nose normal.    Mouth/Throat: Mucous membranes are normal. No trismus in the jaw.  Eyes: Conjunctivae and EOM are normal. No scleral icterus.  Neck: Normal range of motion and phonation normal.  Cardiovascular: Normal rate and regular rhythm.   Pulmonary/Chest: Effort normal. No stridor. No respiratory distress.  Abdominal: He exhibits no distension.  Musculoskeletal: Normal range of motion. He exhibits no edema.       Right ankle: He exhibits no swelling and no ecchymosis. Tenderness. Lateral malleolus, AITFL and CF ligament tenderness found. No medial malleolus, no posterior TFL, no head of 5th metatarsal and no proximal fibula tenderness found.  Neurological: He is alert and oriented to person, place, and time.  Skin: He is not diaphoretic.  Psychiatric: He has a normal mood and affect. His behavior is normal.  Vitals reviewed.    ED Treatments / Results  Labs (all labs ordered are listed, but only abnormal results are displayed) Labs Reviewed - No data to display  EKG  EKG Interpretation None       Radiology Dg Ankle Complete Right  Result Date: 09/12/2016 CLINICAL DATA:  RIGHT ankle pain. EXAM: RIGHT ANKLE - COMPLETE 3+ VIEW COMPARISON:  07/01/2016 FINDINGS: Subtle previous described transverse fracture in the lateral malleolus is not well appreciated. Ankle mortise intact. Talar dome is normal. Remote avulsion fracture inferior to the lateral malleolus. IMPRESSION: No fracture or dislocation. Electronically Signed   By: Suzy Bouchard M.D.   On: 09/12/2016 16:23    Procedures Procedures (including critical care time)  Medications Ordered in ED Medications - No data to display   Initial Impression / Assessment and Plan / ED Course  I have reviewed the triage vital signs and the nursing notes.  Pertinent labs & imaging results that were available during my care of the patient were reviewed by me and considered in my medical decision making (see chart for details).     Plain  film w/o acute fracture. Likely low grade sprain.  RICE and symptomatic treatment recommended.  The patient is safe for discharge with strict return precautions.   Final Clinical Impressions(s) / ED Diagnoses   Final diagnoses:  Sprain of right ankle, unspecified ligament, initial encounter   Disposition: Discharge  Condition: Good  I have discussed the results, Dx and Tx plan with the patient who expressed understanding and agree(s) with the plan. Discharge instructions discussed at great length. The patient was given strict return precautions who verbalized understanding of the instructions. No further questions at time of discharge.    New Prescriptions   No medications on file    Follow Up: Mikey Kirschner, [address] [place] [id number] [phone]  Schedule an appointment as soon as possible for a visit  As needed      Fatima Blank, MD 09/12/16 1744

## 2016-10-01 ENCOUNTER — Encounter: Payer: Self-pay | Admitting: Internal Medicine

## 2016-11-17 ENCOUNTER — Other Ambulatory Visit: Payer: Self-pay | Admitting: *Deleted

## 2016-11-17 ENCOUNTER — Telehealth: Payer: Self-pay | Admitting: Family Medicine

## 2016-11-17 MED ORDER — FLUTICASONE PROPIONATE 50 MCG/ACT NA SUSP: 2.0000 | Freq: Every day | NASAL | 1 refills | Status: DC

## 2016-11-17 MED ORDER — CETIRIZINE HCL 10 MG PO TABS: 10.0000 mg | ORAL_TABLET | Freq: Every day | ORAL | 1 refills | Status: DC

## 2016-11-17 NOTE — Telephone Encounter (Signed)
Pt needs refills on his allergy medicine  fluticasone (FLONASE) 50 MCG/ACT nasal spray  cetirizine (ZYRTEC) 10 MG tablet  Pt states his allergies are acting up with sneezing & runny nose  Please advise & call pt when done      [facility]

## 2016-11-17 NOTE — Telephone Encounter (Signed)
Refills sent to pharm per dr Richardson Landry.  Pt notified

## 2016-12-16 ENCOUNTER — Ambulatory Visit (INDEPENDENT_AMBULATORY_CARE_PROVIDER_SITE_OTHER): Payer: BLUE CROSS/BLUE SHIELD | Admitting: Nurse Practitioner

## 2016-12-16 ENCOUNTER — Encounter: Payer: Self-pay | Admitting: Nurse Practitioner

## 2016-12-16 VITALS — BP 128/82 | Ht 65.5 in | Wt 216.6 lb

## 2016-12-16 DIAGNOSIS — J301 Allergic rhinitis due to pollen: Secondary | ICD-10-CM | POA: Diagnosis not present

## 2016-12-16 DIAGNOSIS — Z0289 Encounter for other administrative examinations: Secondary | ICD-10-CM

## 2016-12-16 DIAGNOSIS — J4541 Moderate persistent asthma with (acute) exacerbation: Secondary | ICD-10-CM | POA: Diagnosis not present

## 2016-12-16 MED ORDER — MONTELUKAST SODIUM 10 MG PO TABS: 10.0000 mg | ORAL_TABLET | Freq: Every day | ORAL | 5 refills | Status: DC

## 2016-12-16 NOTE — Patient Instructions (Signed)
Zaditor eye drops

## 2016-12-17 ENCOUNTER — Other Ambulatory Visit: Payer: Self-pay | Admitting: Nurse Practitioner

## 2016-12-17 ENCOUNTER — Encounter: Payer: Self-pay | Admitting: Nurse Practitioner

## 2016-12-17 DIAGNOSIS — J4541 Moderate persistent asthma with (acute) exacerbation: Secondary | ICD-10-CM

## 2016-12-17 DIAGNOSIS — J301 Allergic rhinitis due to pollen: Secondary | ICD-10-CM

## 2016-12-17 NOTE — Progress Notes (Signed)
Subjective:  Presents for complaints of an allergy flareup that began several weeks ago with the spring season. No fever sore throat headache or ear pain. Frequent cough. Wheezing at times. Has used his inhaler 1 and neb treatment 1 with relief. Itchy watery eyes. Sneezing. Has had to leave work early or missed work due to the severity of his symptoms. Mainly has problems with weather change in seasonal allergies. 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 Allergy symptoms progressively worse each year.  Objective:   BP 128/82   Ht 5' 5.5" (1.664 m)   Wt 216 lb 9.6 oz (98.2 kg)   BMI 35.50 kg/m  NAD. Alert, oriented. Conjunctiva clear bilaterally. TMs retracted, no erythema. Nasal mucosa pale and boggy. Pharynx clear. Neck supple with minimal adenopathy. Lungs clear. Heart regular rate rhythm.  Assessment:   Problem List Items Addressed This Visit      Respiratory   Allergic rhinitis - Primary   Asthma, moderate persistent   Relevant Medications   montelukast (SINGULAIR) 10 MG tablet       Plan:   Meds ordered this encounter  Medications  . montelukast (SINGULAIR) 10 MG tablet    Sig: Take 1 tablet (10 mg total) by mouth at bedtime.    Dispense:  30 tablet    Refill:  5    Order Specific Question:   Supervising Provider    Answer:   Mikey Kirschner [[id number]]   Continue other meds as directed. Call back if symptoms worsen, plan to start oral steroids at that time. Refer to asthma and allergy specialist for further evaluation. Recheck here in the meantime as needed.

## 2016-12-22 ENCOUNTER — Encounter: Payer: Self-pay | Admitting: Family Medicine

## 2017-03-06 ENCOUNTER — Emergency Department (HOSPITAL_COMMUNITY): Payer: BLUE CROSS/BLUE SHIELD

## 2017-03-06 ENCOUNTER — Emergency Department (HOSPITAL_COMMUNITY)
Admission: EM | Admit: 2017-03-06 | Discharge: 2017-03-06 | Disposition: A | Discharge status: Home / Self Care | Payer: BLUE CROSS/BLUE SHIELD | Attending: Emergency Medicine | Admitting: Emergency Medicine

## 2017-03-06 ENCOUNTER — Encounter (HOSPITAL_COMMUNITY): Payer: Self-pay | Admitting: *Deleted

## 2017-03-06 DIAGNOSIS — Y9302 Activity, running: Secondary | ICD-10-CM | POA: Insufficient documentation

## 2017-03-06 DIAGNOSIS — W010XXA Fall on same level from slipping, tripping and stumbling without subsequent striking against object, initial encounter: Secondary | ICD-10-CM | POA: Diagnosis not present

## 2017-03-06 DIAGNOSIS — G8929 Other chronic pain: Secondary | ICD-10-CM | POA: Insufficient documentation

## 2017-03-06 DIAGNOSIS — J45909 Unspecified asthma, uncomplicated: Secondary | ICD-10-CM | POA: Insufficient documentation

## 2017-03-06 DIAGNOSIS — Y929 Unspecified place or not applicable: Secondary | ICD-10-CM | POA: Insufficient documentation

## 2017-03-06 DIAGNOSIS — Y999 Unspecified external cause status: Secondary | ICD-10-CM | POA: Insufficient documentation

## 2017-03-06 DIAGNOSIS — Z79899 Other long term (current) drug therapy: Secondary | ICD-10-CM | POA: Diagnosis not present

## 2017-03-06 DIAGNOSIS — S93401A Sprain of unspecified ligament of right ankle, initial encounter: Secondary | ICD-10-CM | POA: Insufficient documentation

## 2017-03-06 DIAGNOSIS — S99911A Unspecified injury of right ankle, initial encounter: Secondary | ICD-10-CM | POA: Diagnosis present

## 2017-03-06 MED ORDER — DICLOFENAC SODIUM 50 MG PO TBEC: 50.0000 mg | DELAYED_RELEASE_TABLET | Freq: Two times a day (BID) | ORAL | 0 refills | Status: DC

## 2017-03-06 MED ORDER — TRAMADOL HCL 50 MG PO TABS: 50.0000 mg | ORAL_TABLET | Freq: Four times a day (QID) | ORAL | 0 refills | Status: DC | PRN

## 2017-03-06 MED ORDER — BACITRACIN ZINC 500 UNIT/GM EX OINT
TOPICAL_OINTMENT | CUTANEOUS | Status: AC
  Filled 2017-03-06: qty 0.9

## 2017-03-06 NOTE — ED Provider Notes (Signed)
[facility] DEPT Provider Note   CSN: [id number] Arrival date & time: 03/06/17  1809     History   Chief Complaint Chief Complaint  Patient presents with  . Ankle Pain    HPI Frank Hansen is a 26 y.o. male who presents to the ED with right ankle pain. He reports that he was playing outside with his kids 3 days ago and twisted his ankle. He also states that he injured the same ankle while playing basketball 5 months ago and had a tiny fracture of the ankle. Patient also reports that he had injured the ankle in high school playing football.   The history is provided by the patient. No language interpreter was used.  Ankle Pain   The incident occurred more than 2 days ago. The incident occurred at home. Injury mechanism: twisted. The pain is present in the right ankle. The pain is at a severity of 6/10. The pain has been constant since onset. He reports no foreign bodies present. He has tried ice and rest for the symptoms. The treatment provided mild relief.    Past Medical History:  Diagnosis Date  . Asthma   . Chronic abdominal pain   . Hiatal hernia   . Hx of adenomatous colonic polyps 09/2011   Surveillance colonoscopy due 09/2016  . Medial meniscus tear    needs surgery, Dr. Aline Brochure to perform in near future.   . Stomach problems    related to NSAID use    Patient Active Problem List   Diagnosis Date Noted  . Asthma, moderate persistent 12/09/2014  . Gastroesophageal reflux disease without esophagitis 12/09/2014  . Diarrhea 10/15/2014  . Allergic rhinitis 11/30/2013  . Heartburn 06/22/2013  . RLQ abdominal pain 06/22/2013  . Personal history of colonic polyps 11/23/2012  . Cardiomegaly 11/23/2012  . S/P left knee arthroscopy 11/16/2011  . Rectal bleeding 10/08/2011  . Abdominal pain, epigastric 10/08/2011  . Torn meniscus 09/02/2011  . KNEE SPRAIN, RIGHT 05/26/2011  . MEDIAL MENISCUS TEAR, ACUTE 10/30/2010  . ACL TEAR, LEFT KNEE 10/30/2010  . RHEUMATOID  ARTHRITIS 11/05/2009  . KNEE SPRAIN, RIGHT 11/05/2009    Past Surgical History:  Procedure Laterality Date  . ACL graft and medial meniscus repair  12/12/10   left, needs repeat surgery for medial meniscus tear in near future, graft intact  . COLONOSCOPY  09/2011   IDP:[id number] rectal polyp-tubular adenoma. Normal TI. Next TCS in 09/2016.  Marland Kitchen ESOPHAGOGASTRODUODENOSCOPY  09/2011   TIR:[id number] hiatal hernia otherwise negative exam.   . FLEXIBLE SIGMOIDOSCOPY N/A 07/13/2013   RMR: Minimal rectal mucosal abnormality consistent more with enema tip trauma than anything else.  No significant findings consistent with inflammatory bowel disease or other abnormality, S/p rectal bx.The sigmoidoscopy revealed otherwise normal       Home Medications    Prior to Admission medications   Medication Sig Start Date End Date Taking? Authorizing Provider  albuterol (PROVENTIL HFA;VENTOLIN HFA) 108 (90 BASE) MCG/ACT inhaler Inhale 2 puffs into the lungs every 4 (four) hours as needed for wheezing or shortness of breath. 12/07/14   Nilda Simmer, NP  albuterol (PROVENTIL) (2.5 MG/3ML) 0.083% nebulizer solution Use via neb q 4 hrs prn wheezing; may alternate with inhaler 12/07/14   Nilda Simmer, NP  cetirizine (ZYRTEC) 10 MG tablet Take 1 tablet (10 mg total) by mouth daily. 11/17/16   Mikey Kirschner, MD  diclofenac (VOLTAREN) 50 MG EC tablet Take 1 tablet (50 mg total) by mouth 2 (two)  times daily. 03/06/17   Ashley Murrain, NP  fluticasone (FLONASE) 50 MCG/ACT nasal spray Place 2 sprays into both nostrils daily. 11/17/16   Mikey Kirschner, MD  montelukast (SINGULAIR) 10 MG tablet Take 1 tablet (10 mg total) by mouth at bedtime. 12/16/16   Nilda Simmer, NP  traMADol (ULTRAM) 50 MG tablet Take 1 tablet (50 mg total) by mouth every 6 (six) hours as needed. 03/06/17   Ashley Murrain, NP    Family History Family History  Problem Relation Age of Onset  . Diabetes Unknown   . Colon cancer Neg Hx      Social History Social History  Substance Use Topics  . Smoking status: Never Smoker  . Smokeless tobacco: Never Used  . Alcohol use No     Allergies   Patient has no known allergies.   Review of Systems Review of Systems  Musculoskeletal: Positive for arthralgias.       Right ankle  Skin: Negative for color change.  All other systems reviewed and are negative.    Physical Exam Updated Vital Signs BP (!) 161/85 (BP Location: Left Arm)   Pulse 75   Temp 98.8 F (37.1 C) (Oral)   Resp 20   Ht 5\' 6"  (1.676 m)   Wt 95.3 kg (210 lb)   SpO2 100%   BMI 33.89 kg/m   Physical Exam  Constitutional: He appears well-developed and well-nourished. No distress.  HENT:  Head: Normocephalic.  Eyes: EOM are normal.  Neck: Neck supple.  Cardiovascular: Normal rate.   Pulmonary/Chest: Effort normal.  Musculoskeletal:       Right ankle: He exhibits swelling. He exhibits no deformity, no laceration and normal pulse. Decreased range of motion: due to pain. Tenderness. Lateral malleolus tenderness found. Achilles tendon normal.  Neurological: He is alert.  Skin: Skin is warm and dry.  Psychiatric: He has a normal mood and affect.  Nursing note and vitals reviewed.    ED Treatments / Results  Labs (all labs ordered are listed, but only abnormal results are displayed) Labs Reviewed - No data to display  Radiology Dg Ankle Complete Right  Result Date: 03/06/2017 CLINICAL DATA:  Lateral right ankle pain and swelling after recent injury EXAM: RIGHT ANKLE - COMPLETE 3+ VIEW COMPARISON:  09/12/2016 right ankle radiographs FINDINGS: Mild lateral right ankle soft tissue swelling. No acute fracture or subluxation. Stable well corticated osseous fragment near the tip of the right lateral malleolus compatible with remote avulsion injury. No suspicious focal osseous lesion. 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 No radiopaque foreign body. IMPRESSION: 1. Lateral right ankle  soft tissue swelling, with no acute fracture or subluxation. 2. Stable well corticated osseous fragment near the tip of the right lateral malleolus compatible with remote avulsion injury. 3. 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 Electronically Signed   By: Ilona Sorrel M.D.   On: 03/06/2017 18:54    Procedures Procedures (including critical care time)  Medications Ordered in ED Medications - No data to display   Initial Impression / Assessment and Plan / ED Course  I have reviewed the triage vital signs and the nursing notes.  Pertinent imaging results that were available during my care of the patient were reviewed by me and considered in my medical decision making (see chart for details).   Final Clinical Impressions(s) / ED Diagnoses  26 y.o. male with right ankle pain s/p injury 3 days ago while playing with his children stable for  d/c without focal neuro deficits and no acute findings on x-ray. ASO applied, patient has crutches at home. He will ice the area, elevate and follow up with Dr. Luna Glasgow who treated him with his last injury. Return precautions discussed.   Final diagnoses:  Sprain of right ankle, unspecified ligament, initial encounter    New Prescriptions New Prescriptions   DICLOFENAC (VOLTAREN) 50 MG EC TABLET    Take 1 tablet (50 mg total) by mouth 2 (two) times daily.   TRAMADOL (ULTRAM) 50 MG TABLET    Take 1 tablet (50 mg total) by mouth every 6 (six) hours as needed.     Debroah Baller [place], NP 03/06/17 [place], [place], DO 03/10/17 [phone]

## 2017-03-06 NOTE — ED Triage Notes (Signed)
Pt with recent injury to right arm and re injured past Thursday.

## 2017-03-09 ENCOUNTER — Ambulatory Visit: Payer: BLUE CROSS/BLUE SHIELD | Admitting: Allergy and Immunology

## 2017-03-17 ENCOUNTER — Ambulatory Visit (INDEPENDENT_AMBULATORY_CARE_PROVIDER_SITE_OTHER): Payer: BLUE CROSS/BLUE SHIELD | Admitting: Orthopaedic Surgery

## 2017-03-17 ENCOUNTER — Encounter: Payer: Self-pay | Admitting: Orthopaedic Surgery

## 2017-03-17 VITALS — BP 136/81 | HR 65 | Temp 97.2°F | Ht 65.5 in | Wt 215.0 lb

## 2017-03-17 DIAGNOSIS — S96911A Strain of unspecified muscle and tendon at ankle and foot level, right foot, initial encounter: Secondary | ICD-10-CM

## 2017-03-17 NOTE — Progress Notes (Signed)
Patient Frank Hansen, male DOB:1991/05/22, 26 y.o. KGM:[id number]  Chief Complaint  Patient presents with  . Ankle Injury    right    HPI  Frank Hansen is a 26 y.o. male who hurt his ankle on 03-06-17 in a twisting injury.  He was seen in the ER.  He had a prior fracture of the lateral malleolus earlier last year.  No new fracture was seen, old fracture had healed.  He has brace and has used ice.  He is much better today.  He still has some tenderness. He has an ankle brace. HPI  Body mass index is 35.23 kg/m.  ROS  Review of Systems  HENT: Negative for congestion.   Respiratory: Positive for shortness of breath. Negative for cough.   Cardiovascular: Negative for chest pain and leg swelling.  Endocrine: Negative for cold intolerance.  Musculoskeletal: Positive for arthralgias, gait problem and joint swelling.  Allergic/Immunologic: Negative for environmental allergies.    Past Medical History:  Diagnosis Date  . Asthma   . Chronic abdominal pain   . Hiatal hernia   . Hx of adenomatous colonic polyps 09/2011   Surveillance colonoscopy due 09/2016  . Medial meniscus tear    needs surgery, Dr. Aline Brochure to perform in near future.   . Stomach problems    related to NSAID use    Past Surgical History:  Procedure Laterality Date  . ACL graft and medial meniscus repair  12/12/10   left, needs repeat surgery for medial meniscus tear in near future, graft intact  . COLONOSCOPY  09/2011   UYQ:[id number] rectal polyp-tubular adenoma. Normal TI. Next TCS in 09/2016.  Marland Kitchen ESOPHAGOGASTRODUODENOSCOPY  09/2011   VZD:GLOVF hiatal hernia otherwise negative exam.   . FLEXIBLE SIGMOIDOSCOPY N/A 07/13/2013   RMR: Minimal rectal mucosal abnormality consistent more with enema tip trauma than anything else.  No significant findings consistent with inflammatory bowel disease or other abnormality, S/p rectal bx.The sigmoidoscopy revealed otherwise normal    Family History  Problem Relation Age  of Onset  . Diabetes Unknown   . Colon cancer Neg Hx     Social History Social History  Substance Use Topics  . Smoking status: Never Smoker  . Smokeless tobacco: Never Used  . Alcohol use No    No Known Allergies  Current Outpatient Prescriptions  Medication Sig Dispense Refill  . albuterol (PROVENTIL HFA;VENTOLIN HFA) 108 (90 BASE) MCG/ACT inhaler Inhale 2 puffs into the lungs every 4 (four) hours as needed for wheezing or shortness of breath. 1 Inhaler 2  . albuterol (PROVENTIL) (2.5 MG/3ML) 0.083% nebulizer solution Use via neb q 4 hrs prn wheezing; may alternate with inhaler 25 vial 2  . cetirizine (ZYRTEC) 10 MG tablet Take 1 tablet (10 mg total) by mouth daily. 30 tablet 1  . diclofenac (VOLTAREN) 50 MG EC tablet Take 1 tablet (50 mg total) by mouth 2 (two) times daily. 15 tablet 0  . fluticasone (FLONASE) 50 MCG/ACT nasal spray Place 2 sprays into both nostrils daily. 16 g 1  . montelukast (SINGULAIR) 10 MG tablet Take 1 tablet (10 mg total) by mouth at bedtime. 30 tablet 5  . traMADol (ULTRAM) 50 MG tablet Take 1 tablet (50 mg total) by mouth every 6 (six) hours as needed. 15 tablet 0   No current facility-administered medications for this visit.      Physical Exam  Blood pressure 136/81, pulse 65, temperature (!) 97.2 F (36.2 C), height 5' 5.5" (1.664 m), weight 215  lb (97.5 kg).  Constitutional: overall normal hygiene, normal nutrition, well developed, normal grooming, normal body habitus. Assistive device:ankle brace  Musculoskeletal: gait and station Limp none, muscle tone and strength are normal, no tremors or atrophy is present.  .  Neurological: coordination overall normal.  Deep tendon reflex/nerve stretch intact.  Sensation normal.  Cranial nerves II-XII intact.   Skin:   Normal overall no scars, lesions, ulcers or rashes. No psoriasis.  Psychiatric: Alert and oriented x 3.  Recent memory intact, remote memory unclear.  Normal mood and affect. Well  groomed.  Good eye contact.  Cardiovascular: overall no swelling, no varicosities, no edema bilaterally, normal temperatures of the legs and arms, no clubbing, cyanosis and good capillary refill.  Lymphatic: palpation is normal.  Right ankle has slight tenderness of the distal fibula and the anterior talofibular ligament. ROM is full.  NV intact.  He ha no limp.  He is wearing flip flops.  The patient has been educated about the nature of the problem(s) and counseled on treatment options.  The patient appeared to understand what I have discussed and is in agreement with it.  Encounter Diagnosis  Name Primary?  Marland Kitchen Ankle strain, right, initial encounter Yes    PLAN Call if any problems.  Precautions discussed.  Continue current medications.   Return to clinic prn   Electronically Signed Sanjuana Kava, MD 7/25/20189:15 AM

## 2017-06-28 DIAGNOSIS — Z029 Encounter for administrative examinations, unspecified: Secondary | ICD-10-CM

## 2017-06-30 ENCOUNTER — Encounter: Payer: Self-pay | Admitting: Family Medicine

## 2017-06-30 ENCOUNTER — Telehealth: Payer: Self-pay | Admitting: Family Medicine

## 2017-06-30 ENCOUNTER — Ambulatory Visit (INDEPENDENT_AMBULATORY_CARE_PROVIDER_SITE_OTHER): Payer: BLUE CROSS/BLUE SHIELD | Admitting: Family Medicine

## 2017-06-30 VITALS — BP 130/80 | Ht 65.5 in | Wt 226.0 lb

## 2017-06-30 DIAGNOSIS — J4541 Moderate persistent asthma with (acute) exacerbation: Secondary | ICD-10-CM | POA: Diagnosis not present

## 2017-06-30 MED ORDER — ALBUTEROL SULFATE HFA 108 (90 BASE) MCG/ACT IN AERS: 2.0000 | INHALATION_SPRAY | RESPIRATORY_TRACT | 3 refills | Status: DC | PRN

## 2017-06-30 NOTE — Progress Notes (Signed)
   Subjective:    Patient ID: Margarette Asal, male    DOB: 09-01-1990, 26 y.o.   MRN: [id number]  Asthma  He complains of shortness of breath and wheezing. This is a recurrent problem. His past medical history is significant for asthma.   Patient in today to have FMLA papers filled out for asthma.   Works at [employer]  At times environment gets hot and smoky,   Causes wheezing to flare up  Will get sob and wheezy at times. Sometimes pt needs inhaler several times per wk  Two years  Covered by medical insurance  Nonsmoker   Stays active, lifting and moving boes regulaly  States no other concerns this visit.  At times patient gets sick with his asthma and has to take a couple days off.  This does not occur very often maybe once every 2 or 3 months.  Also at times patient becomes short of breath cough and wheezing with either smoke or heat exposure at work.  Then he has to go to break taking inhaler treatment and rest for up to an hour.  Has to do this a couple times per week.  Review of Systems  Respiratory: Positive for shortness of breath and wheezing.        Objective:   Physical Exam  Alert vitals stable, NAD. Blood pressure good on repeat. HEENT normal. Lungs clear. Heart regular rate and rhythm.       Assessment & Plan:  Impression asthma mild persistent in nature.  With intermittent need for albuterol.  And intermittent flares require off work.  Patient declines chronic steroid inhaler etc.  Would like to just use albuterol.  Also declines flu shot

## 2017-06-30 NOTE — Telephone Encounter (Signed)
Patient had FMLA faxed over. Filled in what I could . Please review and fill in highlighted areas,date sign.           In yellow folder.

## 2017-07-05 ENCOUNTER — Telehealth: Payer: Self-pay | Admitting: Family Medicine

## 2017-07-05 NOTE — Telephone Encounter (Signed)
Patient is calling to check on FMLA.Needing it returned in by 11/15.

## 2017-07-05 NOTE — Telephone Encounter (Signed)
done

## 2017-11-29 ENCOUNTER — Telehealth: Payer: Self-pay | Admitting: Family Medicine

## 2017-11-29 NOTE — Telephone Encounter (Signed)
We received fax for FMLA on patient which states from what Ive read he over used days per month and they are wanting the primary care doctor to adjusted episodes. He was last seen 06/30/17.I dont know what to do about this one never had one who over used their episodes before so I put in your box along with copy of old FMLA.

## 2017-11-29 NOTE — Telephone Encounter (Signed)
Please advise 

## 2017-12-21 NOTE — Telephone Encounter (Signed)
Please call and schedule appt with dr Richardson Landry to redo rmla

## 2017-12-21 NOTE — Telephone Encounter (Signed)
They are asking for addtnl documentaion for extra abscences in march and I didn't even see then, this will require face to face visit to try to resolve, besodes time to re do fmla anyway

## 2017-12-22 NOTE — Telephone Encounter (Signed)
Frank Hansen called and left message to call back to schedule.

## 2017-12-29 DIAGNOSIS — Z0289 Encounter for other administrative examinations: Secondary | ICD-10-CM

## 2017-12-30 ENCOUNTER — Ambulatory Visit (INDEPENDENT_AMBULATORY_CARE_PROVIDER_SITE_OTHER): Payer: BLUE CROSS/BLUE SHIELD | Admitting: Family Medicine

## 2017-12-30 ENCOUNTER — Encounter: Payer: Self-pay | Admitting: Family Medicine

## 2017-12-30 VITALS — BP 138/86 | Ht 65.5 in | Wt 231.0 lb

## 2017-12-30 DIAGNOSIS — J4541 Moderate persistent asthma with (acute) exacerbation: Secondary | ICD-10-CM | POA: Diagnosis not present

## 2017-12-30 MED ORDER — PREDNISONE 20 MG PO TABS: ORAL_TABLET | ORAL | 0 refills | Status: DC

## 2017-12-30 MED ORDER — MONTELUKAST SODIUM 10 MG PO TABS: 10.0000 mg | ORAL_TABLET | Freq: Every day | ORAL | 11 refills | Status: DC

## 2017-12-30 MED ORDER — CEFPROZIL 500 MG PO TABS: 500.0000 mg | ORAL_TABLET | Freq: Two times a day (BID) | ORAL | 0 refills | Status: DC

## 2017-12-30 MED ORDER — FLUTICASONE PROPIONATE HFA 110 MCG/ACT IN AERO: 2.0000 | INHALATION_SPRAY | Freq: Two times a day (BID) | RESPIRATORY_TRACT | 5 refills | Status: DC

## 2017-12-30 NOTE — Progress Notes (Signed)
   Subjective:    Patient ID: Frank Hansen, male    DOB: Aug 16, 1991, 27 y.o.   MRN: [id number] .  Patient arrives with acute and chronic concerns patient has long-standing history of asthma. HPIpt arrives to renew FMLA for asthma.   Daily allegra plus steroid nasal spray   Feeling tight and wheezing , uses the neb rx, sometimes before work  Has had cong and dranage and cough and cold at times  Reports mostly his long-standing asthma has been controlled reasonably well with Singulair until lately.  Last couple months has had frequent flares.  Has started to miss more work.  This is exceeded his FMLA delineation  Patient also having substantial challenges in recent weeks with worsening cough.  Frequent need for albuterol multiple times a day.  Positive wheezing.  Day and night.  Positive delay to get into work.  Use albuterol more frequently would prefer Review of Systems No headache, no major weight loss or weight gain, no chest pain no back pain abdominal pain no change in bowel habits complete ROS otherwise negative     Objective:   Physical Exam  Alert and oriented, vitals reviewed and stable, NAD ENT-TM's and ext canals WNL bilat via otoscopic exam Soft palate, tonsils and post pharynx WNL via oropharyngeal exam Neck-symmetric, no masses; thyroid nonpalpable and nontender Pulmonary-no tachypnea or accessory muscle use;  positive expiratory wheezes via auscultation/no tachypnea Card--no abnrml murmurs, rhythm reg and rate WNL Carotid pulses symmetric, without bruits       Assessment & Plan:  1 acute flare of chronic asthma  2.  Chronic asthma poorly controlled.  Frequent need for albuterol in recent months.  Using Singulair for now  Work challenges frequent unable to work.  Or having to work late.  Creating difficulties in terms of ongoing ability to do his job.  Has FMLA but it does not cover current complex presentation  Long discussion L.  Will adjust FMLA.  Will  give short-term steroids.  Will maintain Singulair but also add Flovent couple puffs twice daily/follow-up as scheduled in several months  Greater than 50% of this 25 minute face to face visit was spent in counseling and discussion and coordination of care regarding the above diagnosis/diagnosies

## 2018-01-05 ENCOUNTER — Telehealth: Payer: Self-pay | Admitting: Family Medicine

## 2018-01-05 NOTE — Telephone Encounter (Signed)
I signed off on that one several days ago or end of last week

## 2018-01-05 NOTE — Telephone Encounter (Signed)
Patient checking on FMLA,stating its needs to be turned in 5/20.

## 2018-03-20 ENCOUNTER — Emergency Department (HOSPITAL_COMMUNITY): Payer: BLUE CROSS/BLUE SHIELD

## 2018-03-20 ENCOUNTER — Emergency Department (HOSPITAL_COMMUNITY)
Admission: EM | Admit: 2018-03-20 | Discharge: 2018-03-20 | Disposition: A | Discharge status: Home / Self Care | Payer: BLUE CROSS/BLUE SHIELD | Attending: Emergency Medicine | Admitting: Emergency Medicine

## 2018-03-20 ENCOUNTER — Encounter (HOSPITAL_COMMUNITY): Payer: Self-pay | Admitting: Emergency Medicine

## 2018-03-20 ENCOUNTER — Other Ambulatory Visit: Payer: Self-pay

## 2018-03-20 DIAGNOSIS — R2241 Localized swelling, mass and lump, right lower limb: Secondary | ICD-10-CM | POA: Insufficient documentation

## 2018-03-20 DIAGNOSIS — G8929 Other chronic pain: Secondary | ICD-10-CM | POA: Diagnosis not present

## 2018-03-20 DIAGNOSIS — Z79899 Other long term (current) drug therapy: Secondary | ICD-10-CM | POA: Diagnosis not present

## 2018-03-20 DIAGNOSIS — M25571 Pain in right ankle and joints of right foot: Secondary | ICD-10-CM | POA: Insufficient documentation

## 2018-03-20 DIAGNOSIS — J45909 Unspecified asthma, uncomplicated: Secondary | ICD-10-CM | POA: Diagnosis not present

## 2018-03-20 MED ORDER — MELOXICAM 15 MG PO TABS: 15.0000 mg | ORAL_TABLET | Freq: Every day | ORAL | 0 refills | Status: DC

## 2018-03-20 NOTE — ED Triage Notes (Signed)
Pt with previous ankle injury to R ankle who states he was working in yard today when he turned and "felt it pop". R ankle swollen.

## 2018-03-20 NOTE — Discharge Instructions (Addendum)
Wear your ankle brace as needed for support.  Continue to apply ice packs on and off to your ankle.  Call your orthopedic provider listed to arrange a follow-up appointment if needed.

## 2018-03-20 NOTE — ED Provider Notes (Signed)
[facility] EMERGENCY DEPARTMENT Provider Note   CSN: [id number] Arrival date & time: 03/20/18  2054     History   Chief Complaint Chief Complaint  Patient presents with  . Ankle Pain    HPI Frank Hansen is a 27 y.o. male.  HPI   Frank Hansen is a 27 y.o. male who presents to the Emergency Department complaining of persistent right ankle pain since January.  He reports history of a avulsion fracture of his right ankle with continued pain.  He describes a throbbing sensation to his right ankle that has been worsening today due to excessive walking and standing.  He describes some swelling to his lateral ankle.  He was seen earlier this month for similar symptoms and new injury.  He has been applying ice and wearing an ankle brace with minimal improvement.  He denies numbness of his foot or toes, discoloration, or pain to his calf or knee.  Past Medical History:  Diagnosis Date  . Asthma   . Chronic abdominal pain   . Hiatal hernia   . Hx of adenomatous colonic polyps 09/2011   Surveillance colonoscopy due 09/2016  . Medial meniscus tear    needs surgery, Dr. Aline Brochure to perform in near future.   . Stomach problems    related to NSAID use    Patient Active Problem List   Diagnosis Date Noted  . Asthma, moderate persistent 12/09/2014  . Gastroesophageal reflux disease without esophagitis 12/09/2014  . Diarrhea 10/15/2014  . Allergic rhinitis 11/30/2013  . Heartburn 06/22/2013  . RLQ abdominal pain 06/22/2013  . Personal history of colonic polyps 11/23/2012  . Cardiomegaly 11/23/2012  . S/P left knee arthroscopy 11/16/2011  . Rectal bleeding 10/08/2011  . Abdominal pain, epigastric 10/08/2011  . Torn meniscus 09/02/2011  . KNEE SPRAIN, RIGHT 05/26/2011  . MEDIAL MENISCUS TEAR, ACUTE 10/30/2010  . ACL TEAR, LEFT KNEE 10/30/2010  . RHEUMATOID ARTHRITIS 11/05/2009  . KNEE SPRAIN, RIGHT 11/05/2009    Past Surgical History:  Procedure Laterality Date  . ACL  graft and medial meniscus repair  12/12/10   left, needs repeat surgery for medial meniscus tear in near future, graft intact  . COLONOSCOPY  09/2011   KKX:FGHWEX rectal polyp-tubular adenoma. Normal TI. Next TCS in 09/2016.  Marland Kitchen ESOPHAGOGASTRODUODENOSCOPY  09/2011   HBZ:JIRCV hiatal hernia otherwise negative exam.   . FLEXIBLE SIGMOIDOSCOPY N/A 07/13/2013   RMR: Minimal rectal mucosal abnormality consistent more with enema tip trauma than anything else.  No significant findings consistent with inflammatory bowel disease or other abnormality, S/p rectal bx.The sigmoidoscopy revealed otherwise normal        Home Medications    Prior to Admission medications   Medication Sig Start Date End Date Taking? Authorizing Provider  albuterol (PROVENTIL HFA;VENTOLIN HFA) 108 (90 Base) MCG/ACT inhaler Inhale 2 puffs every 4 (four) hours as needed into the lungs for wheezing or shortness of breath. 06/30/17   Mikey Kirschner, MD  albuterol (PROVENTIL) (2.5 MG/3ML) 0.083% nebulizer solution Use via neb q 4 hrs prn wheezing; may alternate with inhaler 12/07/14   Nilda Simmer, NP  cefPROZIL (CEFZIL) 500 MG tablet Take 1 tablet (500 mg total) by mouth 2 (two) times daily. 12/30/17   Mikey Kirschner, MD  cetirizine (ZYRTEC) 10 MG tablet Take 1 tablet (10 mg total) by mouth daily. Patient not taking: Reported on 06/30/2017 11/17/16   Mikey Kirschner, MD  fexofenadine (ALLEGRA) 180 MG tablet Take 180 mg by mouth  daily.    [provider]  fluticasone (FLONASE) 50 MCG/ACT nasal spray Place 2 sprays into both nostrils daily. 11/17/16   Mikey Kirschner, MD  fluticasone (FLOVENT HFA) 110 MCG/ACT inhaler Inhale 2 puffs into the lungs 2 (two) times daily. 12/30/17   Mikey Kirschner, MD  meloxicam (MOBIC) 15 MG tablet Take 1 tablet (15 mg total) by mouth daily. Take with food 03/20/18   Aamya Orellana, PA-C  montelukast (SINGULAIR) 10 MG tablet Take 1 tablet (10 mg total) by mouth at bedtime. 12/30/17    Mikey Kirschner, MD  predniSONE (DELTASONE) 20 MG tablet Take 3 qd for 3 days, then 2 qd for 3 days, then one qd for 3 days 12/30/17   Mikey Kirschner, MD    Family History Family History  Problem Relation Age of Onset  . Diabetes Unknown   . Colon cancer Neg Hx     Social History Social History   Tobacco Use  . Smoking status: Never Smoker  . Smokeless tobacco: Never Used  Substance Use Topics  . Alcohol use: No  . Drug use: No     Allergies   Patient has no known allergies.   Review of Systems Review of Systems  Constitutional: Negative for chills and fever.  Musculoskeletal: Positive for arthralgias (Right ankle pain) and joint swelling.  Skin: Negative for color change and wound.  Neurological: Negative for weakness and numbness.  All other systems reviewed and are negative.    Physical Exam Updated Vital Signs BP 136/76 (BP Location: Right Arm)   Pulse 92   Temp 98.2 F (36.8 C) (Oral)   Resp 17   Ht 5\' 6"  (1.676 m)   Wt 102.1 kg (225 lb)   SpO2 97%   BMI 36.32 kg/m   Physical Exam  Constitutional: He appears well-developed and well-nourished. No distress.  HENT:  Head: Normocephalic and atraumatic.  Cardiovascular: Normal rate, regular rhythm and intact distal pulses.  Pulmonary/Chest: Effort normal and breath sounds normal.  Musculoskeletal: He exhibits tenderness. He exhibits no deformity.  Local ttp of the lateral right ankle.  minimal edema. No erythema, abrasion, bruising or bony deformity. Achilles tendon intact, non-tender.  No proximal tenderness.  Compartments soft.   Neurological: He is alert. No sensory deficit. He exhibits normal muscle tone.  Skin: Skin is warm and dry. Capillary refill takes less than 2 seconds.  Nursing note and vitals reviewed.    ED Treatments / Results  Labs (all labs ordered are listed, but only abnormal results are displayed) Labs Reviewed - No data to display  EKG None  Radiology Dg Ankle Complete  Right  Result Date: 03/20/2018 CLINICAL DATA:  Right ankle pain and swelling after injury. Heard a pop while working in the yard today. EXAM: RIGHT ANKLE - COMPLETE 3+ VIEW COMPARISON:  Radiographs 03/06/2017 FINDINGS: No acute fracture. No dislocation. Unchanged well corticated osseous density distal to the fibular tip. Mild degenerative spurring at the tibial talar joint. The ankle mortise is preserved. Suspect small tibial talar joint effusion. Lateral soft tissue edema. IMPRESSION: 1. Lateral soft tissue edema and small joint effusion. No acute fracture or dislocation. 2. 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 Electronically Signed   By: Jeb Levering M.D.   On: 03/20/2018 21:24    Procedures Procedures (including critical care time)  Medications Ordered in ED Medications - No data to display   Initial Impression / Assessment and Plan / ED Course  I have reviewed the  triage vital signs and the nursing notes.  Pertinent labs & imaging results that were available during my care of the patient were reviewed by me and considered in my medical decision making (see chart for details).    Pt has ASO brace.  NV intact.  Seen here earlier this month for same.  No concerning sx's for infectious process. Compartments soft.  Advised f/u with Dr. Luna Glasgow.     Final Clinical Impressions(s) / ED Diagnoses   Final diagnoses:  Chronic pain of right ankle    ED Discharge Orders        Ordered    meloxicam (MOBIC) 15 MG tablet  Daily     03/20/18 2140       Kem Parkinson, PA-C 03/20/18 2215    Pattricia Boss, MD 03/20/18 2257

## 2018-03-23 ENCOUNTER — Encounter: Payer: Self-pay | Admitting: Orthopaedic Surgery

## 2018-03-23 ENCOUNTER — Ambulatory Visit: Payer: BLUE CROSS/BLUE SHIELD | Admitting: Orthopaedic Surgery

## 2018-03-23 VITALS — BP 131/71 | HR 79 | Ht 66.0 in | Wt 230.0 lb

## 2018-03-23 DIAGNOSIS — S96911D Strain of unspecified muscle and tendon at ankle and foot level, right foot, subsequent encounter: Secondary | ICD-10-CM

## 2018-03-23 DIAGNOSIS — M25571 Pain in right ankle and joints of right foot: Secondary | ICD-10-CM | POA: Diagnosis not present

## 2018-03-23 NOTE — Progress Notes (Signed)
Patient Frank Hansen, male DOB:Apr 12, 1991, 27 y.o. RCV:[id number]  Chief Complaint  Patient presents with  . Follow-up    R ankle pain for 2 wks    HPI  Frank Hansen is a 27 y.o. male who had a lateral malleolus avulsion fracture in November 2017.  He hurt his ankle again 03-06-17 and had increased pain.  I saw him 03-17-17 for this.  He was given an Armed forces training and education officer and instructions for care of the ankle.  He has had some recurrent strains of the ankle over the last year. He had a new episode of pain just in the last week.  He has increased pain of the lateral ankle with more swelling and pain.  He works 12 hour shifts and has been unable to work. He went to the ER on 03-20-18.  X-rays were negative for new fracture.  He is using his Armed forces training and education officer.  He has pain on walking.    I would like to get a MRI as he has recurrent strains and pain.  I am concerned about a tear complete or incompetent anterior talofibular ligament.    Body mass index is 37.12 kg/m.  ROS  Review of Systems  HENT: Negative for congestion.   Respiratory: Positive for shortness of breath. Negative for cough.   Cardiovascular: Negative for chest pain and leg swelling.  Endocrine: Negative for cold intolerance.  Musculoskeletal: Positive for arthralgias, gait problem and joint swelling.  Allergic/Immunologic: Negative for environmental allergies.    All other systems reviewed and are negative.  Past Medical History:  Diagnosis Date  . Asthma   . Chronic abdominal pain   . Hiatal hernia   . Hx of adenomatous colonic polyps 09/2011   Surveillance colonoscopy due 09/2016  . Medial meniscus tear    needs surgery, Dr. Aline Brochure to perform in near future.   . Stomach problems    related to NSAID use    Past Surgical History:  Procedure Laterality Date  . ACL graft and medial meniscus repair  12/12/10   left, needs repeat surgery for medial meniscus tear in near future, graft intact  . COLONOSCOPY  09/2011   ZWC:HENIDP rectal polyp-tubular adenoma. Normal TI. Next TCS in 09/2016.  Marland Kitchen ESOPHAGOGASTRODUODENOSCOPY  09/2011   OEU:MPNTI hiatal hernia otherwise negative exam.   . FLEXIBLE SIGMOIDOSCOPY N/A 07/13/2013   RMR: Minimal rectal mucosal abnormality consistent more with enema tip trauma than anything else.  No significant findings consistent with inflammatory bowel disease or other abnormality, S/p rectal bx.The sigmoidoscopy revealed otherwise normal    Family History  Problem Relation Age of Onset  . Diabetes Unknown   . Colon cancer Neg Hx     Social History Social History   Tobacco Use  . Smoking status: Never Smoker  . Smokeless tobacco: Never Used  Substance Use Topics  . Alcohol use: No  . Drug use: No    No Known Allergies  Current Outpatient Medications  Medication Sig Dispense Refill  . albuterol (PROVENTIL HFA;VENTOLIN HFA) 108 (90 Base) MCG/ACT inhaler Inhale 2 puffs every 4 (four) hours as needed into the lungs for wheezing or shortness of breath. 1 Inhaler 3  . albuterol (PROVENTIL) (2.5 MG/3ML) 0.083% nebulizer solution Use via neb q 4 hrs prn wheezing; may alternate with inhaler 25 vial 2  . cefPROZIL (CEFZIL) 500 MG tablet Take 1 tablet (500 mg total) by mouth 2 (two) times daily. 20 tablet 0  . cetirizine (ZYRTEC) 10 MG tablet Take 1 tablet (  10 mg total) by mouth daily. (Patient not taking: Reported on 06/30/2017) 30 tablet 1  . fexofenadine (ALLEGRA) 180 MG tablet Take 180 mg by mouth daily.    . fluticasone (FLONASE) 50 MCG/ACT nasal spray Place 2 sprays into both nostrils daily. 16 g 1  . fluticasone (FLOVENT HFA) 110 MCG/ACT inhaler Inhale 2 puffs into the lungs 2 (two) times daily. 1 Inhaler 5  . meloxicam (MOBIC) 15 MG tablet Take 1 tablet (15 mg total) by mouth daily. Take with food 15 tablet 0  . montelukast (SINGULAIR) 10 MG tablet Take 1 tablet (10 mg total) by mouth at bedtime. 30 tablet 11  . predniSONE (DELTASONE) 20 MG tablet Take 3 qd for 3 days, then 2  qd for 3 days, then one qd for 3 days 18 tablet 0   No current facility-administered medications for this visit.      Physical Exam  Blood pressure 131/71, pulse 79, height 5\' 6"  (1.676 m), weight 230 lb (104.3 kg).  Constitutional: overall normal hygiene, normal nutrition, well developed, normal grooming, normal body habitus. Assistive device:Air Cast Right  Musculoskeletal: gait and station Limp right, muscle tone and strength are normal, no tremors or atrophy is present.  .  Neurological: coordination overall normal.  Deep tendon reflex/nerve stretch intact.  Sensation normal.  Cranial nerves II-XII intact.   Skin:   Normal overall no scars, lesions, ulcers or rashes. No psoriasis.  Psychiatric: Alert and oriented x 3.  Recent memory intact, remote memory unclear.  Normal mood and affect. Well groomed.  Good eye contact.  Cardiovascular: overall no swelling, no varicosities, no edema bilaterally, normal temperatures of the legs and arms, no clubbing, cyanosis and good capillary refill.  Lymphatic: palpation is normal.  He has swelling laterally of the right ankle and pain over the talofibular ligament. ROM is decreased secondary to pain.  NV intact.  Limp to the right.  All other systems reviewed and are negative   The patient has been educated about the nature of the problem(s) and counseled on treatment options.  The patient appeared to understand what I have discussed and is in agreement with it.  Encounter Diagnoses  Name Primary?  . Pain in joint involving right ankle and foot Yes  . Strain of right ankle, subsequent encounter     PLAN Call if any problems.  Precautions discussed.  Continue current medications.   Return to clinic after MRI of the right ankle   Electronically Signed Sanjuana Kava, MD 7/31/20193:35 PM

## 2018-03-31 ENCOUNTER — Ambulatory Visit (HOSPITAL_COMMUNITY)
Admission: RE | Admit: 2018-03-31 | Discharge: 2018-03-31 | Disposition: A | Discharge status: Home / Self Care | Payer: BLUE CROSS/BLUE SHIELD | Source: Ambulatory Visit | Attending: Orthopaedic Surgery | Admitting: Orthopaedic Surgery

## 2018-03-31 DIAGNOSIS — M25571 Pain in right ankle and joints of right foot: Secondary | ICD-10-CM | POA: Diagnosis present

## 2018-03-31 DIAGNOSIS — M19071 Primary osteoarthritis, right ankle and foot: Secondary | ICD-10-CM | POA: Insufficient documentation

## 2018-03-31 DIAGNOSIS — M25471 Effusion, right ankle: Secondary | ICD-10-CM | POA: Insufficient documentation

## 2018-03-31 DIAGNOSIS — X58XXXA Exposure to other specified factors, initial encounter: Secondary | ICD-10-CM | POA: Insufficient documentation

## 2018-03-31 DIAGNOSIS — S93401A Sprain of unspecified ligament of right ankle, initial encounter: Secondary | ICD-10-CM | POA: Insufficient documentation

## 2018-04-01 ENCOUNTER — Ambulatory Visit: Payer: BLUE CROSS/BLUE SHIELD | Admitting: Family Medicine

## 2018-04-01 ENCOUNTER — Encounter: Payer: Self-pay | Admitting: Family Medicine

## 2018-04-01 VITALS — BP 128/92 | Ht 65.5 in | Wt 229.0 lb

## 2018-04-01 DIAGNOSIS — J4541 Moderate persistent asthma with (acute) exacerbation: Secondary | ICD-10-CM | POA: Diagnosis not present

## 2018-04-01 MED ORDER — ALBUTEROL SULFATE HFA 108 (90 BASE) MCG/ACT IN AERS: 2.0000 | INHALATION_SPRAY | RESPIRATORY_TRACT | 3 refills | Status: AC | PRN

## 2018-04-01 MED ORDER — ALBUTEROL SULFATE (2.5 MG/3ML) 0.083% IN NEBU: INHALATION_SOLUTION | RESPIRATORY_TRACT | 2 refills | Status: AC

## 2018-04-01 MED ORDER — MONTELUKAST SODIUM 10 MG PO TABS: 10.0000 mg | ORAL_TABLET | Freq: Every day | ORAL | 11 refills | Status: DC

## 2018-04-01 MED ORDER — FEXOFENADINE HCL 180 MG PO TABS: 180.0000 mg | ORAL_TABLET | Freq: Every day | ORAL | 5 refills | Status: DC

## 2018-04-01 MED ORDER — CETIRIZINE HCL 10 MG PO TABS: 10.0000 mg | ORAL_TABLET | Freq: Every day | ORAL | 1 refills | Status: DC

## 2018-04-01 MED ORDER — FLUTICASONE PROPIONATE HFA 110 MCG/ACT IN AERO: 2.0000 | INHALATION_SPRAY | Freq: Two times a day (BID) | RESPIRATORY_TRACT | 5 refills | Status: DC

## 2018-04-01 MED ORDER — FLUTICASONE PROPIONATE 50 MCG/ACT NA SUSP: 2.0000 | Freq: Every day | NASAL | 1 refills | Status: DC

## 2018-04-01 NOTE — Progress Notes (Signed)
   Subjective:    Patient ID: Frank Hansen, male    DOB: [birth year], 27 y.o.   MRN: [id number]  HPI   Patient is here today to follow up on his allergies. He states at last visit he was started on allergra and it is working well for him.   steroied inhaler tolerating much better  Some smoke exposure but non smoker  Tolerating it well.  No obvious side effects.  Using faithfully.  Much less challenges with attacks of asthma thankfully      Review of Systems No headache, no major weight loss or weight gain, no chest pain no back pain abdominal pain no change in bowel habits complete ROS otherwise negative     Objective:   Physical Exam  Alert vitals stable, NAD. Blood pressure good on repeat. HEENT normal. Lungs clear. Heart regular rate and rhythm.       Assessment & Plan:  Impression chronic asthma discussed to maintain same dose of Flovent rationale discussed warning signs discussed

## 2018-04-05 ENCOUNTER — Encounter: Payer: Self-pay | Admitting: Orthopaedic Surgery

## 2018-04-05 ENCOUNTER — Ambulatory Visit (INDEPENDENT_AMBULATORY_CARE_PROVIDER_SITE_OTHER): Payer: BLUE CROSS/BLUE SHIELD | Admitting: Orthopaedic Surgery

## 2018-04-05 VITALS — BP 159/81 | HR 79 | Ht 66.0 in | Wt 228.0 lb

## 2018-04-05 DIAGNOSIS — M25571 Pain in right ankle and joints of right foot: Secondary | ICD-10-CM

## 2018-04-05 NOTE — Patient Instructions (Signed)
Out of work.  To see Dr. Sharol Given.

## 2018-04-05 NOTE — Progress Notes (Signed)
Patient Frank Hansen, male DOB:06-05-1991, 27 y.o. ZCH:[id number]  Chief Complaint  Patient presents with  . Ankle Pain    right   . Results    review MRI     HPI  Frank Hansen is a 27 y.o. male who has recurrent right ankle strains and pain.  He had a MRI which showed: IMPRESSION: 1. Possible nondisplaced talar neck fracture. 2. High-grade partial tear of the anterior talofibular ligament. 3. Possible low-grade partial tear of the deltoid ligament at the talar attachment. 4. Abnormal signal within the sinus tarsi. Correlate for sinus tarsi syndrome. 5. Mild tibiotalar and posterior subtalar osteoarthritis with small joint effusions.  I have explained the findings to him.  I will put him in a CAM walker to be cautious.  I will have him see [facility] for further evaluation.  He agrees.    Body mass index is 36.8 kg/m.  ROS  Review of Systems  HENT: Negative for congestion.   Respiratory: Positive for shortness of breath. Negative for cough.   Cardiovascular: Negative for chest pain and leg swelling.  Endocrine: Negative for cold intolerance.  Musculoskeletal: Positive for arthralgias, gait problem and joint swelling.  Allergic/Immunologic: Negative for environmental allergies.    All other systems reviewed and are negative.  Past Medical History:  Diagnosis Date  . Asthma   . Chronic abdominal pain   . Hiatal hernia   . Hx of adenomatous colonic polyps 09/2011   Surveillance colonoscopy due 09/2016  . Medial meniscus tear    needs surgery, Dr. Aline Brochure to perform in near future.   . Stomach problems    related to NSAID use    Past Surgical History:  Procedure Laterality Date  . ACL graft and medial meniscus repair  12/12/10   left, needs repeat surgery for medial meniscus tear in near future, graft intact  . COLONOSCOPY  09/2011   OIN:OMVEHM rectal polyp-tubular adenoma. Normal TI. Next TCS in 09/2016.  Marland Kitchen ESOPHAGOGASTRODUODENOSCOPY  09/2011   CNO:BSJGG hiatal hernia otherwise negative exam.   . FLEXIBLE SIGMOIDOSCOPY N/A 07/13/2013   RMR: Minimal rectal mucosal abnormality consistent more with enema tip trauma than anything else.  No significant findings consistent with inflammatory bowel disease or other abnormality, S/p rectal bx.The sigmoidoscopy revealed otherwise normal    Family History  Problem Relation Age of Onset  . Diabetes Unknown   . Colon cancer Neg Hx     Social History Social History   Tobacco Use  . Smoking status: Never Smoker  . Smokeless tobacco: Never Used  Substance Use Topics  . Alcohol use: No  . Drug use: No    No Known Allergies  Current Outpatient Medications  Medication Sig Dispense Refill  . albuterol (PROVENTIL HFA;VENTOLIN HFA) 108 (90 Base) MCG/ACT inhaler Inhale 2 puffs into the lungs every 4 (four) hours as needed for wheezing or shortness of breath. 1 Inhaler 3  . albuterol (PROVENTIL) (2.5 MG/3ML) 0.083% nebulizer solution Use via neb q 4 hrs prn wheezing; may alternate with inhaler 25 vial 2  . cefPROZIL (CEFZIL) 500 MG tablet Take 1 tablet (500 mg total) by mouth 2 (two) times daily. (Patient not taking: Reported on 04/01/2018) 20 tablet 0  . cetirizine (ZYRTEC) 10 MG tablet Take 1 tablet (10 mg total) by mouth daily. 30 tablet 1  . fexofenadine (ALLEGRA) 180 MG tablet Take 1 tablet (180 mg total) by mouth daily. 30 tablet 5  . fluticasone (FLONASE) 50 MCG/ACT nasal spray Place 2  sprays into both nostrils daily. 16 g 1  . fluticasone (FLOVENT HFA) 110 MCG/ACT inhaler Inhale 2 puffs into the lungs 2 (two) times daily. 1 Inhaler 5  . meloxicam (MOBIC) 15 MG tablet Take 1 tablet (15 mg total) by mouth daily. Take with food 15 tablet 0  . montelukast (SINGULAIR) 10 MG tablet Take 1 tablet (10 mg total) by mouth at bedtime. 30 tablet 11  . predniSONE (DELTASONE) 20 MG tablet Take 3 qd for 3 days, then 2 qd for 3 days, then one qd for 3 days 18 tablet 0   No current facility-administered  medications for this visit.      Physical Exam  Blood pressure (!) 159/81, pulse 79, height 5\' 6"  (1.676 m), weight 228 lb (103.4 kg).  Constitutional: overall normal hygiene, normal nutrition, well developed, normal grooming, normal body habitus. Assistive device:none  Musculoskeletal: gait and station Limp right, muscle tone and strength are normal, no tremors or atrophy is present.  .  Neurological: coordination overall normal.  Deep tendon reflex/nerve stretch intact.  Sensation normal.  Cranial nerves II-XII intact.   Skin:   Normal overall no scars, lesions, ulcers or rashes. No psoriasis.  Psychiatric: Alert and oriented x 3.  Recent memory intact, remote memory unclear.  Normal mood and affect. Well groomed.  Good eye contact.  Cardiovascular: overall no swelling, no varicosities, no edema bilaterally, normal temperatures of the legs and arms, no clubbing, cyanosis and good capillary refill.  Lymphatic: palpation is normal.  He has pain of the right ankle more laterally over the anterior talofibular ligament with limp to the right.  He has some lateral swelling.  NV intact.  All other systems reviewed and are negative   The patient has been educated about the nature of the problem(s) and counseled on treatment options.  The patient appeared to understand what I have discussed and is in agreement with it.  Encounter Diagnosis  Name Primary?  . Pain in joint involving right ankle and foot Yes    PLAN Call if any problems.  Precautions discussed.  Continue current medications.   Return to clinic to [place]   CAM walker given.  Out of work.  Electronically Signed Sanjuana Kava, MD 8/13/20192:38 PM

## 2018-04-13 ENCOUNTER — Encounter (INDEPENDENT_AMBULATORY_CARE_PROVIDER_SITE_OTHER): Payer: Self-pay | Admitting: Orthopedic Surgery

## 2018-04-13 ENCOUNTER — Ambulatory Visit (INDEPENDENT_AMBULATORY_CARE_PROVIDER_SITE_OTHER): Payer: BLUE CROSS/BLUE SHIELD | Admitting: Orthopedic Surgery

## 2018-04-13 DIAGNOSIS — M84374A Stress fracture, right foot, initial encounter for fracture: Secondary | ICD-10-CM

## 2018-04-13 NOTE — Progress Notes (Signed)
Office Visit Note   Patient: Frank Hansen           Date of Birth: 09-Aug-1991           MRN: [id number] Visit Date: 04/13/2018              Requested by: Sanjuana Kava, MD [address] [place], [place] [address] PCP: Mikey Kirschner, MD  No chief complaint on file.     HPI: Patient is a 27 year old gentleman who was seen for initial evaluation for a stress fracture of the talar neck right foot.  Patient was injured about 4 years ago and states he had a fracture of the fibula right foot.  He states this healed uneventfully.  Patient states that recently about 4 weeks ago he was playing basketball he went for a lay up taking off his right foot and had acute onset of pain patient states he did not fall he states he landed on both feet.  Patient states he does not smoke.  Negative history for diabetes.  Patient states he does take prednisone 20 mg a day for several months every spring and he states he has been doing this all his life.  Assessment & Plan: Visit Diagnoses:  1. Stress fracture of right foot, initial encounter     Plan: We will place patient on crutches nonweightbearing in the fracture boot he will wear the boot at all times discussed the importance of not stressing this fracture through the talus that he is at risk for displaced fracture which would require internal fixation.  3 view radiographs of the right foot at follow-up.    Patient's stress fracture may be due to his elevated BMI which is close to 40 with the high impact of basketball compliant with a chronic use of steroids.  Follow-Up Instructions: Return in about 4 weeks (around 05/11/2018).   Ortho Exam  Patient is alert, oriented, no adenopathy, well-dressed, normal affect, normal respiratory effort. Examination patient has an antalgic gait he is using a fracture boot.  He has a good dorsalis pedis pulse he has good ankle good subtalar motion.  He is tender to palpation over the anterior  talofibular ligament there is about a millimeter of laxity with an anterior drawer which is painful.  The deltoid ligament is tender to palpation as well as anteriorly over the ankle joint.  He is tender to palpation across the talus.  The peroneal and posterior tibial tendons are nontender to palpation.  Review of the radiographs show no displaced fractures.  Review of the MRI scan shows increased edema of the talar neck consistent with a stress fracture.  There is also effusion with fluid in the ankle joint and there is no displacement of the talus.  Imaging: No results found. No images are attached to the encounter.  Labs: No results found for: HGBA1C, ESRSEDRATE, CRP, LABURIC, REPTSTATUS, GRAMSTAIN, CULT, LABORGA   Lab Results  Component Value Date   ALBUMIN 4.0 08/02/2015   ALBUMIN 4.3 07/13/2014   ALBUMIN 3.6 06/19/2013    There is no height or weight on file to calculate BMI.  Orders:  No orders of the defined types were placed in this encounter.  No orders of the defined types were placed in this encounter.    Procedures: No procedures performed  Clinical Data: No additional findings.  ROS:  All other systems negative, except as noted in the HPI. Review of Systems  Objective: Vital Signs: There were no vitals  taken for this visit.  Specialty Comments:  No specialty comments available.  PMFS History: Patient Active Problem List   Diagnosis Date Noted  . Asthma, moderate persistent 12/09/2014  . Gastroesophageal reflux disease without esophagitis 12/09/2014  . Diarrhea 10/15/2014  . Allergic rhinitis 11/30/2013  . Heartburn 06/22/2013  . RLQ abdominal pain 06/22/2013  . Personal history of colonic polyps 11/23/2012  . Cardiomegaly 11/23/2012  . S/P left knee arthroscopy 11/16/2011  . Rectal bleeding 10/08/2011  . Abdominal pain, epigastric 10/08/2011  . Torn meniscus 09/02/2011  . KNEE SPRAIN, RIGHT 05/26/2011  . MEDIAL MENISCUS TEAR, ACUTE 10/30/2010   . ACL TEAR, LEFT KNEE 10/30/2010  . RHEUMATOID ARTHRITIS 11/05/2009  . KNEE SPRAIN, RIGHT 11/05/2009   Past Medical History:  Diagnosis Date  . Asthma   . Chronic abdominal pain   . Hiatal hernia   . Hx of adenomatous colonic polyps 09/2011   Surveillance colonoscopy due 09/2016  . Medial meniscus tear    needs surgery, Dr. Aline Brochure to perform in near future.   . Stomach problems    related to NSAID use    Family History  Problem Relation Age of Onset  . Diabetes Unknown   . Colon cancer Neg Hx     Past Surgical History:  Procedure Laterality Date  . ACL graft and medial meniscus repair  12/12/10   left, needs repeat surgery for medial meniscus tear in near future, graft intact  . COLONOSCOPY  09/2011   VVO:HYWVPX rectal polyp-tubular adenoma. Normal TI. Next TCS in 09/2016.  Marland Kitchen ESOPHAGOGASTRODUODENOSCOPY  09/2011   TGG:YIRSW hiatal hernia otherwise negative exam.   . FLEXIBLE SIGMOIDOSCOPY N/A 07/13/2013   RMR: Minimal rectal mucosal abnormality consistent more with enema tip trauma than anything else.  No significant findings consistent with inflammatory bowel disease or other abnormality, S/p rectal bx.The sigmoidoscopy revealed otherwise normal   Social History   Occupational History  . 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 Smoking status: Never Smoker  . Smokeless tobacco: Never Used  Substance and Sexual Activity  . Alcohol use: No  . Drug use: No  . Sexual activity: Not on file

## 2018-04-14 ENCOUNTER — Encounter (INDEPENDENT_AMBULATORY_CARE_PROVIDER_SITE_OTHER): Payer: Self-pay | Admitting: Orthopedic Surgery

## 2018-04-14 ENCOUNTER — Other Ambulatory Visit (INDEPENDENT_AMBULATORY_CARE_PROVIDER_SITE_OTHER): Payer: Self-pay

## 2018-04-14 ENCOUNTER — Telehealth (INDEPENDENT_AMBULATORY_CARE_PROVIDER_SITE_OTHER): Payer: Self-pay | Admitting: Orthopedic Surgery

## 2018-04-14 NOTE — Telephone Encounter (Signed)
Patient said he wants this note faxed to [facility] so he can pick it up there, told him I wasn't sure If we'd be able to do that but their fax # [phone].  Patients [phone]

## 2018-04-14 NOTE — Telephone Encounter (Signed)
Called and advised pt that letter had been faxed to office. I am not sure if they will accept the letter being that there is only a typed signature and not an original signature but pt to let me know if there are any problems.

## 2018-04-14 NOTE — Telephone Encounter (Signed)
Patient called and stated that he needed a note for out of work-stating to keep him out of work for the next 4 weeks or until DUDA thinks he's ready to to back. Patient needs to stay off leg.  Call patient to advise [phone]

## 2018-05-11 ENCOUNTER — Encounter (INDEPENDENT_AMBULATORY_CARE_PROVIDER_SITE_OTHER): Payer: Self-pay | Admitting: Family

## 2018-05-11 ENCOUNTER — Ambulatory Visit (INDEPENDENT_AMBULATORY_CARE_PROVIDER_SITE_OTHER): Payer: BLUE CROSS/BLUE SHIELD | Admitting: Orthopedic Surgery

## 2018-05-11 ENCOUNTER — Ambulatory Visit (INDEPENDENT_AMBULATORY_CARE_PROVIDER_SITE_OTHER): Payer: BLUE CROSS/BLUE SHIELD | Admitting: Family

## 2018-05-11 ENCOUNTER — Ambulatory Visit (INDEPENDENT_AMBULATORY_CARE_PROVIDER_SITE_OTHER): Payer: Self-pay

## 2018-05-11 DIAGNOSIS — S93491D Sprain of other ligament of right ankle, subsequent encounter: Secondary | ICD-10-CM | POA: Diagnosis not present

## 2018-05-11 DIAGNOSIS — M84374A Stress fracture, right foot, initial encounter for fracture: Secondary | ICD-10-CM

## 2018-05-11 MED ORDER — TRAMADOL HCL 50 MG PO TABS: 50.0000 mg | ORAL_TABLET | Freq: Two times a day (BID) | ORAL | 0 refills | Status: DC | PRN

## 2018-05-11 NOTE — Progress Notes (Signed)
Office Visit Note   Patient: Frank Hansen           Date of Birth: 08-18-1991           MRN: [id number] Visit Date: 05/11/2018              Requested by: Mikey Kirschner, [address] [place], [place] [address] PCP: Mikey Kirschner, MD  Chief Complaint  Patient presents with  . Right Foot - Follow-up, Fracture      HPI: Patient is a 27 year old gentleman who is seen in follow up for a stress fracture of the talar neck right foot.  Patient continues to complain of pain in his right foot and ankle points to the anterior joint line as well as the lateral side of his ankle.  Pain is poorly localized.  States is worse with bending and range of motion of his ankle.  Has been wearing his fracture boot full weightbearing with one crutch.  States he is unable to nonweightbearing due to necessity of caring for his children.  Patient was injured about 4 years ago and states he had a fracture of the fibula right foot.  He states this healed uneventfully.  Patient states that recently about 4 weeks ago he was playing basketball he went for a lay up taking off his right foot and had acute onset of pain patient states he did not fall he states he landed on both feet.  Patient states he does not smoke.  Negative history for diabetes.  Patient states he does take prednisone 20 mg a day for several months every spring and he states he has been doing this all his life.  Assessment & Plan: Visit Diagnoses:  1. Stress fracture of right foot, initial encounter   2. Sprain of anterior talofibular ligament of right ankle, subsequent encounter     Plan: Again emphasized the importance of nonweightbearing wearing the fracture boot with all weightbearing.  Encouraged him to resume to crutches.  No weightbearing barefoot in the home.   Follow-Up Instructions: No follow-ups on file.   Ortho Exam  Patient is alert, oriented, no adenopathy, well-dressed, normal affect, normal  respiratory effort. Examination patient has an antalgic gait he is using a fracture boot.  He has a good dorsalis pedis pulse he has good ankle good subtalar motion.  He is tender to palpation over the anterior talofibular and calcaneofibular ligament there is about a millimeter of laxity with an anterior drawer which is painful.  The anterior joint line is diffusely tender.  Also complaining of pain in the lateral foot along the fifth metatarsal. the peroneal and posterior tibial tendons are nontender to palpation.   Imaging: No results found. No images are attached to the encounter.  Labs: No results found for: HGBA1C, ESRSEDRATE, CRP, LABURIC, REPTSTATUS, GRAMSTAIN, CULT, LABORGA   Lab Results  Component Value Date   ALBUMIN 4.0 08/02/2015   ALBUMIN 4.3 07/13/2014   ALBUMIN 3.6 06/19/2013    There is no height or weight on file to calculate BMI.  Orders:  Orders Placed This Encounter  Procedures  . XR Foot Complete Right   Meds ordered this encounter  Medications  . traMADol (ULTRAM) 50 MG tablet    Sig: Take 1 tablet (50 mg total) by mouth every 12 (twelve) hours as needed.    Dispense:  28 tablet    Refill:  0     Procedures: No procedures performed  Clinical Data: No  additional findings.  ROS:  All other systems negative, except as noted in the HPI. Review of Systems  Constitutional: Negative for chills and fever.  Musculoskeletal: Positive for arthralgias, gait problem and myalgias.    Objective: Vital Signs: There were no vitals taken for this visit.  Specialty Comments:  No specialty comments available.  PMFS History: Patient Active Problem List   Diagnosis Date Noted  . Asthma, moderate persistent 12/09/2014  . Gastroesophageal reflux disease without esophagitis 12/09/2014  . Diarrhea 10/15/2014  . Allergic rhinitis 11/30/2013  . Heartburn 06/22/2013  . RLQ abdominal pain 06/22/2013  . Personal history of colonic polyps 11/23/2012  .  Cardiomegaly 11/23/2012  . S/P left knee arthroscopy 11/16/2011  . Rectal bleeding 10/08/2011  . Abdominal pain, epigastric 10/08/2011  . Torn meniscus 09/02/2011  . Right ankle sprain 05/26/2011  . MEDIAL MENISCUS TEAR, ACUTE 10/30/2010  . ACL TEAR, LEFT KNEE 10/30/2010  . RHEUMATOID ARTHRITIS 11/05/2009  . KNEE SPRAIN, RIGHT 11/05/2009   Past Medical History:  Diagnosis Date  . Asthma   . Chronic abdominal pain   . Hiatal hernia   . Hx of adenomatous colonic polyps 09/2011   Surveillance colonoscopy due 09/2016  . Medial meniscus tear    needs surgery, Dr. Aline Brochure to perform in near future.   . Stomach problems    related to NSAID use    Family History  Problem Relation Age of Onset  . Diabetes Unknown   . Colon cancer Neg Hx     Past Surgical History:  Procedure Laterality Date  . ACL graft and medial meniscus repair  12/12/10   left, needs repeat surgery for medial meniscus tear in near future, graft intact  . COLONOSCOPY  09/2011   VFI:EPPIRJ rectal polyp-tubular adenoma. Normal TI. Next TCS in 09/2016.  Marland Kitchen ESOPHAGOGASTRODUODENOSCOPY  09/2011   JOA:CZYSA hiatal hernia otherwise negative exam.   . FLEXIBLE SIGMOIDOSCOPY N/A 07/13/2013   RMR: Minimal rectal mucosal abnormality consistent more with enema tip trauma than anything else.  No significant findings consistent with inflammatory bowel disease or other abnormality, S/p rectal bx.The sigmoidoscopy revealed otherwise normal   Social History   Occupational History  . 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 Smoking status: Never Smoker  . Smokeless tobacco: Never Used  Substance and Sexual Activity  . Alcohol use: No  . Drug use: No  . Sexual activity: Not on file

## 2018-05-12 ENCOUNTER — Ambulatory Visit (INDEPENDENT_AMBULATORY_CARE_PROVIDER_SITE_OTHER): Payer: BLUE CROSS/BLUE SHIELD | Admitting: Orthopedic Surgery

## 2018-06-01 ENCOUNTER — Ambulatory Visit (INDEPENDENT_AMBULATORY_CARE_PROVIDER_SITE_OTHER): Payer: BLUE CROSS/BLUE SHIELD | Admitting: Orthopedic Surgery

## 2018-06-01 ENCOUNTER — Encounter (INDEPENDENT_AMBULATORY_CARE_PROVIDER_SITE_OTHER): Payer: Self-pay | Admitting: Family

## 2018-06-01 VITALS — Ht 66.0 in | Wt 228.0 lb

## 2018-06-01 DIAGNOSIS — M84374A Stress fracture, right foot, initial encounter for fracture: Secondary | ICD-10-CM | POA: Diagnosis not present

## 2018-06-01 DIAGNOSIS — M79671 Pain in right foot: Secondary | ICD-10-CM | POA: Diagnosis not present

## 2018-06-01 MED ORDER — METHYLPREDNISOLONE ACETATE 80 MG/ML IJ SUSP
80.0000 mg | INTRAMUSCULAR | Status: AC | PRN
  Administered 2018-06-01: 80 mg via INTRA_ARTICULAR

## 2018-06-01 MED ORDER — LIDOCAINE HCL 1 % IJ SOLN
1.0000 mL | INTRAMUSCULAR | Status: AC | PRN
  Administered 2018-06-01: 1 mL

## 2018-06-01 NOTE — Progress Notes (Signed)
Office Visit Note   Patient: Frank Hansen           Date of Birth: 1991-02-06           MRN: [id number] Visit Date: 06/01/2018              Requested by: Mikey Kirschner, [place] [address] [employer], [place] [address] PCP: Mikey Kirschner, MD  Chief Complaint  Patient presents with  . Right Ankle - Pain, Follow-up      HPI: Patient is a 27 year old gentleman who is status post a stress fracture of the talus right foot.  Patient states that he has been nonweightbearing with the fracture boot and crutches.  He complains of pain with weightbearing.  He states he has increased swelling around the ankle.  He states he cannot take pain medication due to GI symptoms.  Assessment & Plan: Visit Diagnoses:  1. Stress fracture of right foot, initial encounter   2. Pain in right foot     Plan: Subtalar joint was injected reevaluate in 3 weeks he was given a note to be out of work for 3 weeks.  Follow-Up Instructions: Return in about 3 weeks (around 06/22/2018).   Ortho Exam  Patient is alert, oriented, no adenopathy, well-dressed, normal affect, normal respiratory effort. Examination patient has minimal tenderness palpation of the peroneal or posterior tibial tendon he has good ankle range of motion subtalar motion reproduces his pain.  He is maximally tender to palpation over the sinus Tarsi.  Patient subtalar pain is consistent with the talar stress fracture.  Imaging: No results found. No images are attached to the encounter.  Labs: No results found for: HGBA1C, ESRSEDRATE, CRP, LABURIC, REPTSTATUS, GRAMSTAIN, CULT, LABORGA   Lab Results  Component Value Date   ALBUMIN 4.0 08/02/2015   ALBUMIN 4.3 07/13/2014   ALBUMIN 3.6 06/19/2013    Body mass index is 36.8 kg/m.  Orders:  Orders Placed This Encounter  Procedures  . Small Joint Inj   No orders of the defined types were placed in this encounter.    Procedures: Small Joint Inj: R subtalar on  06/01/2018 5:10 PM Indications: pain and diagnostic evaluation Details: 22 G needle, dorsal approach  Spinal Needle: No  Medications: 1 mL lidocaine 1 %; 80 mg methylPREDNISolone acetate 80 MG/ML Outcome: tolerated well, no immediate complications Procedure, treatment alternatives, risks and benefits explained, specific risks discussed. Consent was given by the patient. Immediately prior to procedure a time out was called to verify the correct patient, procedure, equipment, support staff and site/side marked as required. Patient was prepped and draped in the usual sterile fashion.      Clinical Data: No additional findings.  ROS:  All other systems negative, except as noted in the HPI. Review of Systems  Objective: Vital Signs: Ht 5\' 6"  (1.676 m)   Wt 228 lb (103.4 kg)   BMI 36.80 kg/m   Specialty Comments:  No specialty comments available.  PMFS History: Patient Active Problem List   Diagnosis Date Noted  . Asthma, moderate persistent 12/09/2014  . Gastroesophageal reflux disease without esophagitis 12/09/2014  . Diarrhea 10/15/2014  . Allergic rhinitis 11/30/2013  . Heartburn 06/22/2013  . RLQ abdominal pain 06/22/2013  . Personal history of colonic polyps 11/23/2012  . Cardiomegaly 11/23/2012  . S/P left knee arthroscopy 11/16/2011  . Rectal bleeding 10/08/2011  . Abdominal pain, epigastric 10/08/2011  . Torn meniscus 09/02/2011  . Right ankle sprain 05/26/2011  . MEDIAL  MENISCUS TEAR, ACUTE 10/30/2010  . ACL TEAR, LEFT KNEE 10/30/2010  . RHEUMATOID ARTHRITIS 11/05/2009  . KNEE SPRAIN, RIGHT 11/05/2009   Past Medical History:  Diagnosis Date  . Asthma   . Chronic abdominal pain   . Hiatal hernia   . Hx of adenomatous colonic polyps 09/2011   Surveillance colonoscopy due 09/2016  . Medial meniscus tear    needs surgery, Dr. Aline Brochure to perform in near future.   . Stomach problems    related to NSAID use    Family History  Problem Relation Age of Onset    . Diabetes Unknown   . Colon cancer Neg Hx     Past Surgical History:  Procedure Laterality Date  . ACL graft and medial meniscus repair  12/12/10   left, needs repeat surgery for medial meniscus tear in near future, graft intact  . COLONOSCOPY  09/2011   PJK:DTOIZT rectal polyp-tubular adenoma. Normal TI. Next TCS in 09/2016.  Marland Kitchen ESOPHAGOGASTRODUODENOSCOPY  09/2011   IWP:YKDXI hiatal hernia otherwise negative exam.   . FLEXIBLE SIGMOIDOSCOPY N/A 07/13/2013   RMR: Minimal rectal mucosal abnormality consistent more with enema tip trauma than anything else.  No significant findings consistent with inflammatory bowel disease or other abnormality, S/p rectal bx.The sigmoidoscopy revealed otherwise normal   Social History   Occupational History  . 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 Smoking status: Never Smoker  . Smokeless tobacco: Never Used  Substance and Sexual Activity  . Alcohol use: No  . Drug use: No  . Sexual activity: Not on file

## 2018-06-22 ENCOUNTER — Ambulatory Visit (INDEPENDENT_AMBULATORY_CARE_PROVIDER_SITE_OTHER): Payer: BLUE CROSS/BLUE SHIELD | Admitting: Orthopedic Surgery

## 2018-06-22 ENCOUNTER — Encounter (INDEPENDENT_AMBULATORY_CARE_PROVIDER_SITE_OTHER): Payer: Self-pay | Admitting: Orthopedic Surgery

## 2018-06-22 DIAGNOSIS — G8929 Other chronic pain: Secondary | ICD-10-CM

## 2018-06-22 DIAGNOSIS — M25571 Pain in right ankle and joints of right foot: Secondary | ICD-10-CM

## 2018-06-22 NOTE — Progress Notes (Signed)
Office Visit Note   Patient: Frank Hansen           Date of Birth: 01-Jun-1991           MRN: [id number] Visit Date: 06/22/2018              Requested by: Mikey Kirschner, [place] [address] [place], [place] [address] PCP: Mikey Kirschner, MD  Chief Complaint  Patient presents with  . Right Foot - Pain, Follow-up    Stress fracture f/u of right ankle/foot      HPI: Patient is a 27 year old gentleman who is status post a stress fracture of the talus right foot.  Patient states that he has been full weightbearing in regular shoe wear. States the injection worked well. He is pain free. Is ready to return to work.   Assessment & Plan: Visit Diagnoses:  No diagnosis found.  Plan: activities as tolerated. ASO for exercise. Call or return for any concerns. Given a note to return to work without restrictions.  Follow-Up Instructions: No follow-ups on file.   Ortho Exam  Patient is alert, oriented, no adenopathy, well-dressed, normal affect, normal respiratory effort. Examination patient has minimal tenderness palpation of the peroneal or posterior tibial tendon he has good ankle range of motion subtalar motion reproduces his pain.  He is maximally tender to palpation over the sinus Tarsi which is less tender than last exam.   Imaging: No results found. No images are attached to the encounter.  Labs: No results found for: HGBA1C, ESRSEDRATE, CRP, LABURIC, REPTSTATUS, GRAMSTAIN, CULT, LABORGA   Lab Results  Component Value Date   ALBUMIN 4.0 08/02/2015   ALBUMIN 4.3 07/13/2014   ALBUMIN 3.6 06/19/2013    Body mass index is 36.8 kg/m.  Orders:  No orders of the defined types were placed in this encounter.  No orders of the defined types were placed in this encounter.    Procedures: No procedures performed  Clinical Data: No additional findings.  ROS:  All other systems negative, except as noted in the HPI. Review of Systems  Constitutional:  Negative for chills and fever.  Musculoskeletal: Positive for arthralgias. Negative for gait problem and joint swelling.    Objective: Vital Signs: Ht 5\' 6"  (1.676 m)   Wt 228 lb (103.4 kg)   BMI 36.80 kg/m   Specialty Comments:  No specialty comments available.  PMFS History: Patient Active Problem List   Diagnosis Date Noted  . Asthma, moderate persistent 12/09/2014  . Gastroesophageal reflux disease without esophagitis 12/09/2014  . Diarrhea 10/15/2014  . Allergic rhinitis 11/30/2013  . Heartburn 06/22/2013  . RLQ abdominal pain 06/22/2013  . Personal history of colonic polyps 11/23/2012  . Cardiomegaly 11/23/2012  . S/P left knee arthroscopy 11/16/2011  . Rectal bleeding 10/08/2011  . Abdominal pain, epigastric 10/08/2011  . Torn meniscus 09/02/2011  . Right ankle sprain 05/26/2011  . MEDIAL MENISCUS TEAR, ACUTE 10/30/2010  . ACL TEAR, LEFT KNEE 10/30/2010  . RHEUMATOID ARTHRITIS 11/05/2009  . KNEE SPRAIN, RIGHT 11/05/2009   Past Medical History:  Diagnosis Date  . Asthma   . Chronic abdominal pain   . Hiatal hernia   . Hx of adenomatous colonic polyps 09/2011   Surveillance colonoscopy due 09/2016  . Medial meniscus tear    needs surgery, Dr. Aline Brochure to perform in near future.   . Stomach problems    related to NSAID use    Family History  Problem Relation Age of Onset  .  Diabetes Unknown   . Colon cancer Neg Hx     Past Surgical History:  Procedure Laterality Date  . ACL graft and medial meniscus repair  12/12/10   left, needs repeat surgery for medial meniscus tear in near future, graft intact  . COLONOSCOPY  09/2011   HUD:JSHFWY rectal polyp-tubular adenoma. Normal TI. Next TCS in 09/2016.  Marland Kitchen ESOPHAGOGASTRODUODENOSCOPY  09/2011   OVZ:CHYIF hiatal hernia otherwise negative exam.   . FLEXIBLE SIGMOIDOSCOPY N/A 07/13/2013   RMR: Minimal rectal mucosal abnormality consistent more with enema tip trauma than anything else.  No significant findings consistent  with inflammatory bowel disease or other abnormality, S/p rectal bx.The sigmoidoscopy revealed otherwise normal   Social History   Occupational History  . 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 Smoking status: Never Smoker  . Smokeless tobacco: Never Used  Substance and Sexual Activity  . Alcohol use: No  . Drug use: No  . Sexual activity: Not on file

## 2018-08-03 ENCOUNTER — Telehealth: Payer: Self-pay | Admitting: Family Medicine

## 2018-08-03 NOTE — Telephone Encounter (Signed)
Patient had FMLA faxed over for update on his asthma. Please review form date,sign in your yellow folder.

## 2018-08-08 DIAGNOSIS — Z029 Encounter for administrative examinations, unspecified: Secondary | ICD-10-CM

## 2018-08-10 ENCOUNTER — Telehealth: Payer: Self-pay | Admitting: Family Medicine

## 2018-08-10 NOTE — Telephone Encounter (Signed)
Left two messages that FMLA was ready but needed payment before it could but faxed called on 08/03/18 at 9:30 and again 08/08/18 and 1:15 to remind patient again but no response.

## 2018-09-19 ENCOUNTER — Other Ambulatory Visit: Payer: Self-pay

## 2018-09-19 ENCOUNTER — Emergency Department (HOSPITAL_COMMUNITY)
Admission: EM | Admit: 2018-09-19 | Discharge: 2018-09-19 | Disposition: A | Discharge status: Home / Self Care | Payer: Self-pay | Attending: Emergency Medicine | Admitting: Emergency Medicine

## 2018-09-19 ENCOUNTER — Emergency Department (HOSPITAL_COMMUNITY): Payer: Self-pay

## 2018-09-19 ENCOUNTER — Encounter (HOSPITAL_COMMUNITY): Payer: Self-pay | Admitting: Emergency Medicine

## 2018-09-19 DIAGNOSIS — J181 Lobar pneumonia, unspecified organism: Secondary | ICD-10-CM | POA: Insufficient documentation

## 2018-09-19 DIAGNOSIS — R6889 Other general symptoms and signs: Secondary | ICD-10-CM

## 2018-09-19 DIAGNOSIS — J189 Pneumonia, unspecified organism: Secondary | ICD-10-CM

## 2018-09-19 DIAGNOSIS — J4540 Moderate persistent asthma, uncomplicated: Secondary | ICD-10-CM | POA: Insufficient documentation

## 2018-09-19 DIAGNOSIS — Z79899 Other long term (current) drug therapy: Secondary | ICD-10-CM | POA: Insufficient documentation

## 2018-09-19 LAB — URINALYSIS, ROUTINE W REFLEX MICROSCOPIC
Bilirubin Urine: NEGATIVE
Glucose, UA: NEGATIVE mg/dL
Hgb urine dipstick: NEGATIVE
Ketones, ur: NEGATIVE mg/dL
Leukocytes, UA: NEGATIVE
NITRITE: NEGATIVE
PROTEIN: NEGATIVE mg/dL
Specific Gravity, Urine: 1.029 (ref 1.005–1.030)
pH: 7 (ref 5.0–8.0)

## 2018-09-19 MED ORDER — BENZONATATE 100 MG PO CAPS: 200.0000 mg | ORAL_CAPSULE | Freq: Three times a day (TID) | ORAL | 0 refills | Status: DC | PRN

## 2018-09-19 MED ORDER — ACETAMINOPHEN 325 MG PO TABS
650.0000 mg | ORAL_TABLET | Freq: Once | ORAL | Status: AC
  Administered 2018-09-19: 650 mg via ORAL
  Filled 2018-09-19: qty 2

## 2018-09-19 MED ORDER — AMOXICILLIN 500 MG PO CAPS: 1000.0000 mg | ORAL_CAPSULE | Freq: Three times a day (TID) | ORAL | 0 refills | Status: AC

## 2018-09-19 MED ORDER — AMOXICILLIN 250 MG PO CAPS
1000.0000 mg | ORAL_CAPSULE | Freq: Once | ORAL | Status: AC
  Administered 2018-09-19: 1000 mg via ORAL
  Filled 2018-09-19: qty 4

## 2018-09-19 MED ORDER — BENZONATATE 100 MG PO CAPS
200.0000 mg | ORAL_CAPSULE | Freq: Once | ORAL | Status: AC
  Administered 2018-09-19: 200 mg via ORAL
  Filled 2018-09-19: qty 2

## 2018-09-19 NOTE — ED Triage Notes (Signed)
Patient reports fever, cough, chills, and generalized body aches x 3 days.

## 2018-09-19 NOTE — Discharge Instructions (Addendum)
As discussed, your chest x-ray shows that you have a very small, early pneumonia in your left lung which will require the antibiotics prescribed.  Your symptoms are also suggestive of the flu.  Take the antibiotic prescribed for the full 10 days.  I also recommend you using your inhaler or your nebulizer machine every 4 hours if needed for coughing or shortness of breath or wheezing.  Rest and make sure you are drinking plenty of fluids.  Continue taking Tylenol for fever reduction.  Get rechecked as discussed for any new or worsening symptoms.

## 2018-09-19 NOTE — ED Provider Notes (Signed)
[facility] EMERGENCY DEPARTMENT Provider Note   CSN: [id number] Arrival date & time: 09/19/18  1606     History   Chief Complaint Chief Complaint  Patient presents with  . Influenza    HPI Frank Hansen is a 28 y.o. male.  The history is provided by the patient.  Influenza  Presenting symptoms: cough, fatigue, fever, headache, myalgias, shortness of breath and sore throat   Presenting symptoms: no diarrhea, no nausea and no vomiting   Presenting symptoms comment:  He endorses wheezing intermittently.  He denies any known exposures to influenza, he did not get a flu vaccine this year and he works in [employer]. Severity:  Moderate Onset quality:  Sudden Duration:  2 days Progression:  Unchanged Chronicity:  New Relieved by: Response transiently to NyQuil, last dose taken this morning. Worsened by:  Nothing Associated symptoms: chills, decreased appetite and decreased physical activity   Associated symptoms: no congestion and no neck stiffness     Past Medical History:  Diagnosis Date  . Asthma   . Chronic abdominal pain   . Hiatal hernia   . Hx of adenomatous colonic polyps 09/2011   Surveillance colonoscopy due 09/2016  . Medial meniscus tear    needs surgery, Dr. Aline Brochure to perform in near future.   . Stomach problems    related to NSAID use    Patient Active Problem List   Diagnosis Date Noted  . Chronic pain of right ankle 06/22/2018  . Asthma, moderate persistent 12/09/2014  . Gastroesophageal reflux disease without esophagitis 12/09/2014  . Diarrhea 10/15/2014  . Allergic rhinitis 11/30/2013  . Heartburn 06/22/2013  . RLQ abdominal pain 06/22/2013  . Personal history of colonic polyps 11/23/2012  . Cardiomegaly 11/23/2012  . S/P left knee arthroscopy 11/16/2011  . Rectal bleeding 10/08/2011  . Abdominal pain, epigastric 10/08/2011  . Torn meniscus 09/02/2011  . Right ankle sprain 05/26/2011  . MEDIAL MENISCUS TEAR, ACUTE 10/30/2010  . ACL TEAR,  LEFT KNEE 10/30/2010  . RHEUMATOID ARTHRITIS 11/05/2009  . KNEE SPRAIN, RIGHT 11/05/2009    Past Surgical History:  Procedure Laterality Date  . ACL graft and medial meniscus repair  12/12/10   left, needs repeat surgery for medial meniscus tear in near future, graft intact  . COLONOSCOPY  09/2011   WUJ:[id number] rectal polyp-tubular adenoma. Normal TI. Next TCS in 09/2016.  Marland Kitchen ESOPHAGOGASTRODUODENOSCOPY  09/2011   NWG:NFAOZ hiatal hernia otherwise negative exam.   . FLEXIBLE SIGMOIDOSCOPY N/A 07/13/2013   RMR: Minimal rectal mucosal abnormality consistent more with enema tip trauma than anything else.  No significant findings consistent with inflammatory bowel disease or other abnormality, S/p rectal bx.The sigmoidoscopy revealed otherwise normal        Home Medications    Prior to Admission medications   Medication Sig Start Date End Date Taking? Authorizing Provider  albuterol (PROVENTIL HFA;VENTOLIN HFA) 108 (90 Base) MCG/ACT inhaler Inhale 2 puffs into the lungs every 4 (four) hours as needed for wheezing or shortness of breath. 04/01/18   Mikey Kirschner, MD  albuterol (PROVENTIL) (2.5 MG/3ML) 0.083% nebulizer solution Use via neb q 4 hrs prn wheezing; may alternate with inhaler 04/01/18   Mikey Kirschner, MD  amoxicillin (AMOXIL) 500 MG capsule Take 2 capsules (1,000 mg total) by mouth 3 (three) times daily for 10 days. 09/19/18 09/29/18  Evalee Jefferson, PA-C  benzonatate (TESSALON) 100 MG capsule Take 2 capsules (200 mg total) by mouth 3 (three) times daily as needed. 09/19/18  Evalee Jefferson, PA-C  cefPROZIL (CEFZIL) 500 MG tablet Take 1 tablet (500 mg total) by mouth 2 (two) times daily. 12/30/17   Mikey Kirschner, MD  cetirizine (ZYRTEC) 10 MG tablet Take 1 tablet (10 mg total) by mouth daily. 04/01/18   Mikey Kirschner, MD  fexofenadine (ALLEGRA) 180 MG tablet Take 1 tablet (180 mg total) by mouth daily. 04/01/18   Mikey Kirschner, MD  fluticasone (FLONASE) 50 MCG/ACT nasal spray Place 2  sprays into both nostrils daily. 04/01/18   Mikey Kirschner, MD  fluticasone (FLOVENT HFA) 110 MCG/ACT inhaler Inhale 2 puffs into the lungs 2 (two) times daily. 04/01/18   Mikey Kirschner, MD  meloxicam (MOBIC) 15 MG tablet Take 1 tablet (15 mg total) by mouth daily. Take with food 03/20/18   Triplett, Tammy, PA-C  montelukast (SINGULAIR) 10 MG tablet Take 1 tablet (10 mg total) by mouth at bedtime. 04/01/18   Mikey Kirschner, MD  predniSONE (DELTASONE) 20 MG tablet Take 3 qd for 3 days, then 2 qd for 3 days, then one qd for 3 days 12/30/17   Mikey Kirschner, MD  traMADol (ULTRAM) 50 MG tablet Take 1 tablet (50 mg total) by mouth every 12 (twelve) hours as needed. 05/11/18   Suzan Slick, NP    Family History Family History  Problem Relation Age of Onset  . Diabetes Other   . Colon cancer Neg Hx     Social History Social History   Tobacco Use  . Smoking status: Never Smoker  . Smokeless tobacco: Never Used  Substance Use Topics  . Alcohol use: No  . Drug use: No     Allergies   Patient has no known allergies.   Review of Systems Review of Systems  Constitutional: Positive for chills, decreased appetite, fatigue and fever.  HENT: Positive for sore throat. Negative for congestion.   Respiratory: Positive for cough and shortness of breath.   Cardiovascular: Negative for chest pain.  Gastrointestinal: Negative for abdominal pain, diarrhea, nausea and vomiting.  Musculoskeletal: Positive for myalgias. Negative for neck stiffness.  Neurological: Positive for headaches.     Physical Exam Updated Vital Signs BP 140/85 (BP Location: Right Arm)   Pulse (!) 103   Temp (!) 100.4 F (38 C) (Oral)   Resp 16   Ht 5\' 6"  (1.676 m)   Wt 99.8 kg   SpO2 98%   BMI 35.51 kg/m   Physical Exam Vitals signs reviewed.  Constitutional:      Appearance: He is well-developed.  HENT:     Head: Normocephalic and atraumatic.     Right Ear: Tympanic membrane and ear canal normal.      Left Ear: Tympanic membrane and ear canal normal.     Nose: No mucosal edema or rhinorrhea.     Mouth/Throat:     Pharynx: Uvula midline. No oropharyngeal exudate or posterior oropharyngeal erythema.     Tonsils: No tonsillar abscesses.  Eyes:     Conjunctiva/sclera: Conjunctivae normal.  Cardiovascular:     Rate and Rhythm: Normal rate.     Heart sounds: Normal heart sounds.  Pulmonary:     Effort: Pulmonary effort is normal. No respiratory distress.     Breath sounds: No wheezing or rales.     Comments: Coarse breath sounds left midfield. Abdominal:     Palpations: Abdomen is soft.     Tenderness: There is no abdominal tenderness.  Musculoskeletal: Normal range of motion.  Skin:  General: Skin is warm and dry.     Findings: No rash.  Neurological:     Mental Status: He is alert and oriented to person, place, and time.      ED Treatments / Results  Labs (all labs ordered are listed, but only abnormal results are displayed) Labs Reviewed  URINALYSIS, ROUTINE W REFLEX MICROSCOPIC - Abnormal; Notable for the following components:      Result Value   APPearance HAZY (*)    All other components within normal limits    EKG None  Radiology Dg Chest 2 View  Result Date: 09/19/2018 CLINICAL DATA:  Fever and productive cough for the past 3 days. EXAM: CHEST - 2 VIEW COMPARISON:  Chest x-ray dated June 19, 2013. FINDINGS: The heart size and mediastinal contours are within normal limits. Normal pulmonary vascularity. Subtle mildly increased patchy opacity in the left lower lobe. No pleural effusion pneumothorax feet No acute osseous abnormality. IMPRESSION: Slightly increased patchy opacity in the left lower lobe, suspicious for pneumonia given clinical history. Electronically Signed   By: Titus Dubin M.D.   On: 09/19/2018 17:13    Procedures Procedures (including critical care time)  Medications Ordered in ED Medications  benzonatate (TESSALON) capsule 200 mg (200 mg  Oral Given 09/19/18 1712)  acetaminophen (TYLENOL) tablet 650 mg (650 mg Oral Given 09/19/18 1712)  amoxicillin (AMOXIL) capsule 1,000 mg (1,000 mg Oral Given 09/19/18 1818)     Initial Impression / Assessment and Plan / ED Course  I have reviewed the triage vital signs and the nursing notes.  Pertinent labs & imaging results that were available during my care of the patient were reviewed by me and considered in my medical decision making (see chart for details).     Patient with flulike symptoms but also with findings on exam and per imaging suggesting an infection in his left lower lobe.  He has no wheezing or shortness of breath during his visit here.  He was placed on high-dose amoxicillin, Tessalon also prescribed for cough.  He has albuterol both with an MDI and neb treatments at home, he was encouraged to use his inhaler 2 puffs every 4 hours for any coughing, wheezing or shortness of breath.  Discussed other home treatments for symptom relief.  Plan recheck here or by his PCP for any worsening symptoms.  Final Clinical Impressions(s) / ED Diagnoses   Final diagnoses:  Flu-like symptoms  Community acquired pneumonia of left lower lobe of lung [facility])    ED Discharge Orders         Ordered    amoxicillin (AMOXIL) 500 MG capsule  3 times daily     09/19/18 1843    benzonatate (TESSALON) 100 MG capsule  3 times daily PRN     09/19/18 1843           Landis Martins 09/19/18 Diamantina Monks, MD 09/19/18 2332

## 2018-10-31 ENCOUNTER — Ambulatory Visit (INDEPENDENT_AMBULATORY_CARE_PROVIDER_SITE_OTHER): Payer: Self-pay | Admitting: Family Medicine

## 2018-10-31 VITALS — BP 132/94 | HR 89 | Ht 67.0 in | Wt 239.0 lb

## 2018-10-31 DIAGNOSIS — J302 Other seasonal allergic rhinitis: Secondary | ICD-10-CM

## 2018-10-31 MED ORDER — FLUTICASONE PROPIONATE 50 MCG/ACT NA SUSP: 2.0000 | Freq: Every day | NASAL | 1 refills | Status: DC

## 2018-10-31 NOTE — Progress Notes (Signed)
   Subjective:    Patient ID: Margarette Asal, male    DOB: 1990/11/18, 28 y.o.   MRN: [id number]  HPI The patient comes in today for a wellness visit.    A review of their health history was completed.  A review of medications was also completed.  Any needed refills; flonase, allergy eye drops ( not on med list)   Eating habits: not health conscious  Falls/  MVA accidents in past few months: none  Regular exercise: walking and cutting trees  Specialist pt sees on regular basis: none  Preventative health issues were discussed.   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     Review of Systems  Constitutional: Negative for activity change, appetite change and fever.  HENT: Negative for congestion and rhinorrhea.   Eyes: Negative for discharge.  Respiratory: Negative for cough and wheezing.   Cardiovascular: Negative for chest pain.  Gastrointestinal: Negative for abdominal pain, blood in stool and vomiting.  Genitourinary: Negative for difficulty urinating and frequency.  Musculoskeletal: Negative for neck pain.  Skin: Negative for rash.  Allergic/Immunologic: Negative for environmental allergies and food allergies.  Neurological: Negative for weakness and headaches.  Psychiatric/Behavioral: Negative for agitation.       Objective:   Physical Exam Vitals signs reviewed.  Constitutional:      Appearance: He is well-developed.  HENT:     Head: Normocephalic and atraumatic.     Right Ear: External ear normal.     Left Ear: External ear normal.     Nose: Nose normal.  Eyes:     Pupils: Pupils are equal, round, and reactive to light.  Neck:     Musculoskeletal: Normal range of motion and neck supple.     Thyroid: No thyromegaly.  Cardiovascular:     Rate and Rhythm: Normal rate and regular rhythm.     Heart sounds: Normal heart sounds. No murmur.  Pulmonary:     Effort: Pulmonary effort is normal. No respiratory distress.   Breath sounds: Normal breath sounds. No wheezing.  Abdominal:     General: Bowel sounds are normal. There is no distension.     Palpations: Abdomen is soft. There is no mass.     Tenderness: There is no abdominal tenderness.  Genitourinary:    Penis: Normal.   Musculoskeletal: Normal range of motion.  Lymphadenopathy:     Cervical: No cervical adenopathy.  Skin:    General: Skin is warm and dry.     Findings: No erythema.  Neurological:     Mental Status: He is alert.     Motor: No abnormal muscle tone.  Psychiatric:        Behavior: Behavior normal.        Judgment: Judgment normal.           Assessment & Plan:  Impression well adult exam.  Diet discussed.  Exercise discussed.  Vaccines discussed and encouraged.  Physical form filled out.  Patient does have a lot of challenges with allergies would like is allergic medications refilled

## 2018-11-09 ENCOUNTER — Other Ambulatory Visit: Payer: Self-pay

## 2018-11-09 ENCOUNTER — Ambulatory Visit (INDEPENDENT_AMBULATORY_CARE_PROVIDER_SITE_OTHER): Payer: Self-pay | Admitting: Family Medicine

## 2018-11-09 ENCOUNTER — Encounter: Payer: Self-pay | Admitting: Family Medicine

## 2018-11-09 VITALS — Ht 67.0 in

## 2018-11-09 DIAGNOSIS — J329 Chronic sinusitis, unspecified: Secondary | ICD-10-CM

## 2018-11-09 NOTE — Progress Notes (Signed)
   Subjective:    Patient ID: Frank Hansen, male    DOB: 1991-07-02, 28 y.o.   MRN: [id number]  Abdominal Pain  This is a new problem. Episode onset: 2 days. The pain is located in the LLQ. The pain is at a severity of 5/10. The quality of the pain is sharp. Associated symptoms include diarrhea. Pertinent negatives include no fever, nausea or vomiting. Nothing aggravates the pain. The pain is relieved by nothing. He has tried nothing for the symptoms.  diarrhea at least 4 times a day, no blood or mucus in stool.   Pt states he has an appointment tomorrow with Dr. Sydell Axon tomorrow at [facility] but apt is not showing up in system.   Trouble breathing started yesterday. No cough, no fever. Trouble breathing when sitting and moving around. History of asthma. Having watery eyes and sneezing. Started taking zyrtec this morning.    Review of Systems  Constitutional: Negative for fever.  Gastrointestinal: Positive for abdominal pain and diarrhea. Negative for nausea and vomiting.       Objective:   Physical Exam Alert active slight mallets pharynx normal lungs bronchial cough no tachypnea no wheezes no crackles heart regular       Assessment & Plan:  Impression viral syndrome element of bronchitis plan antibiotics prescribed symptom care discussed warning signs discussed

## 2018-11-24 ENCOUNTER — Ambulatory Visit: Payer: Self-pay | Admitting: Gastroenterology

## 2019-01-14 ENCOUNTER — Emergency Department (HOSPITAL_COMMUNITY)
Admission: EM | Admit: 2019-01-14 | Discharge: 2019-01-15 | Disposition: A | Discharge status: Home / Self Care | Payer: Self-pay | Attending: Emergency Medicine | Admitting: Emergency Medicine

## 2019-01-14 ENCOUNTER — Encounter (HOSPITAL_COMMUNITY): Payer: Self-pay | Admitting: Emergency Medicine

## 2019-01-14 ENCOUNTER — Other Ambulatory Visit: Payer: Self-pay

## 2019-01-14 DIAGNOSIS — J45909 Unspecified asthma, uncomplicated: Secondary | ICD-10-CM | POA: Insufficient documentation

## 2019-01-14 DIAGNOSIS — Z79899 Other long term (current) drug therapy: Secondary | ICD-10-CM | POA: Insufficient documentation

## 2019-01-14 DIAGNOSIS — N289 Disorder of kidney and ureter, unspecified: Secondary | ICD-10-CM | POA: Insufficient documentation

## 2019-01-14 DIAGNOSIS — R1013 Epigastric pain: Secondary | ICD-10-CM | POA: Insufficient documentation

## 2019-01-14 LAB — URINALYSIS, ROUTINE W REFLEX MICROSCOPIC
Bilirubin Urine: NEGATIVE
Glucose, UA: NEGATIVE mg/dL
Hgb urine dipstick: NEGATIVE
Ketones, ur: NEGATIVE mg/dL
Leukocytes,Ua: NEGATIVE
Nitrite: NEGATIVE
Protein, ur: NEGATIVE mg/dL
Specific Gravity, Urine: 1.03 (ref 1.005–1.030)
pH: 5 (ref 5.0–8.0)

## 2019-01-14 LAB — COMPREHENSIVE METABOLIC PANEL
ALT: 31 U/L (ref 0–44)
AST: 28 U/L (ref 15–41)
Albumin: 4.5 g/dL (ref 3.5–5.0)
Alkaline Phosphatase: 78 U/L (ref 38–126)
Anion gap: 12 (ref 5–15)
BUN: 19 mg/dL (ref 6–20)
CO2: 24 mmol/L (ref 22–32)
Calcium: 9.9 mg/dL (ref 8.9–10.3)
Chloride: 104 mmol/L (ref 98–111)
Creatinine, Ser: 1.32 mg/dL — ABNORMAL HIGH (ref 0.61–1.24)
GFR calc Af Amer: >60 mL/min (ref >60)
GFR calc non Af Amer: >60 mL/min (ref >60)
Glucose, Bld: 99 mg/dL (ref 70–99)
Potassium: 4 mmol/L (ref 3.5–5.1)
Sodium: 140 mmol/L (ref 135–145)
Total Bilirubin: 0.6 mg/dL (ref 0.3–1.2)
Total Protein: 8.5 g/dL — ABNORMAL HIGH (ref 6.5–8.1)

## 2019-01-14 LAB — CBC
HCT: 45.3 % (ref 39.0–52.0)
Hemoglobin: 15.4 g/dL (ref 13.0–17.0)
MCH: 32 pg (ref 26.0–34.0)
MCHC: 34 g/dL (ref 30.0–36.0)
MCV: 94.2 fL (ref 80.0–100.0)
Platelets: 319 10*3/uL (ref 150–400)
RBC: 4.81 MIL/uL (ref 4.22–5.81)
RDW: 12.9 % (ref 11.5–15.5)
WBC: 12.4 10*3/uL — ABNORMAL HIGH (ref 4.0–10.5)
nRBC: 0 % (ref 0.0–0.2)

## 2019-01-14 LAB — LIPASE, BLOOD: Lipase: 36 U/L (ref 11–51)

## 2019-01-14 MED ORDER — PANTOPRAZOLE SODIUM 40 MG PO TBEC
40.0000 mg | DELAYED_RELEASE_TABLET | Freq: Once | ORAL | Status: AC
  Administered 2019-01-15: 40 mg via ORAL
  Filled 2019-01-14: qty 1

## 2019-01-14 MED ORDER — SODIUM CHLORIDE 0.9% FLUSH: 3.0000 mL | Freq: Once | INTRAVENOUS | Status: DC

## 2019-01-14 MED ORDER — ALUM & MAG HYDROXIDE-SIMETH 200-200-20 MG/5ML PO SUSP
30.0000 mL | Freq: Once | ORAL | Status: AC
  Administered 2019-01-14: 30 mL via ORAL
  Filled 2019-01-14: qty 30

## 2019-01-14 MED ORDER — LIDOCAINE VISCOUS HCL 2 % MT SOLN
15.0000 mL | Freq: Once | OROMUCOSAL | Status: AC
  Administered 2019-01-14: 15 mL via ORAL
  Filled 2019-01-14: qty 15

## 2019-01-14 NOTE — ED Triage Notes (Signed)
Pt c/o abdominal pain that started while eating a cheeseburger this afternoon. Pt states when he takes a deep breath that pain is worse. Pt states he has polyps and is supposed to get yearly check ups but has not been able make the follow up appts.

## 2019-01-14 NOTE — ED Provider Notes (Signed)
[facility] EMERGENCY DEPARTMENT Provider Note   CSN: [id number] Arrival date & time: 01/14/19  1957    History   Chief Complaint Chief Complaint  Patient presents with  . Abdominal Pain    HPI Frank Hansen is a 28 y.o. male.   The history is provided by the patient.  Abdominal Pain  He has history of hiatus hernia, chronic abdominal pain, colonic polyps and comes in because of epigastric pain which started this afternoon while eating a cheeseburger.  Pain does not radiate.  Pain was initially 10/10, but has subsided to where it is now 6/10.  There is no associated nausea or vomiting.  He denies fever, chills, sweats.  There is been no constipation or diarrhea.  He has had similar pain in the past but does not recall what the diagnosis was.  He has not done anything to try to treat this.  Past Medical History:  Diagnosis Date  . Asthma   . Chronic abdominal pain   . Hiatal hernia   . Hx of adenomatous colonic polyps 09/2011   Surveillance colonoscopy due 09/2016  . Medial meniscus tear    needs surgery, Dr. Aline Brochure to perform in near future.   . Stomach problems    related to NSAID use    Patient Active Problem List   Diagnosis Date Noted  . Chronic pain of right ankle 06/22/2018  . Asthma, moderate persistent 12/09/2014  . Gastroesophageal reflux disease without esophagitis 12/09/2014  . Diarrhea 10/15/2014  . Allergic rhinitis 11/30/2013  . Heartburn 06/22/2013  . RLQ abdominal pain 06/22/2013  . Personal history of colonic polyps 11/23/2012  . Cardiomegaly 11/23/2012  . S/P left knee arthroscopy 11/16/2011  . Rectal bleeding 10/08/2011  . Abdominal pain, epigastric 10/08/2011  . Torn meniscus 09/02/2011  . Right ankle sprain 05/26/2011  . MEDIAL MENISCUS TEAR, ACUTE 10/30/2010  . ACL TEAR, LEFT KNEE 10/30/2010  . RHEUMATOID ARTHRITIS 11/05/2009  . KNEE SPRAIN, RIGHT 11/05/2009    Past Surgical History:  Procedure Laterality Date  . ACL graft and  medial meniscus repair  12/12/10   left, needs repeat surgery for medial meniscus tear in near future, graft intact  . COLONOSCOPY  09/2011   TWS:FKCLEX rectal polyp-tubular adenoma. Normal TI. Next TCS in 09/2016.  Marland Kitchen ESOPHAGOGASTRODUODENOSCOPY  09/2011   NTZ:GYFVC hiatal hernia otherwise negative exam.   . FLEXIBLE SIGMOIDOSCOPY N/A 07/13/2013   RMR: Minimal rectal mucosal abnormality consistent more with enema tip trauma than anything else.  No significant findings consistent with inflammatory bowel disease or other abnormality, S/p rectal bx.The sigmoidoscopy revealed otherwise normal        Home Medications    Prior to Admission medications   Medication Sig Start Date End Date Taking? Authorizing Provider  albuterol (PROVENTIL HFA;VENTOLIN HFA) 108 (90 Base) MCG/ACT inhaler Inhale 2 puffs into the lungs every 4 (four) hours as needed for wheezing or shortness of breath. 04/01/18   Mikey Kirschner, MD  albuterol (PROVENTIL) (2.5 MG/3ML) 0.083% nebulizer solution Use via neb q 4 hrs prn wheezing; may alternate with inhaler 04/01/18   Mikey Kirschner, MD  cetirizine (ZYRTEC) 10 MG tablet Take 1 tablet (10 mg total) by mouth daily. 04/01/18   Mikey Kirschner, MD  fexofenadine (ALLEGRA) 180 MG tablet Take 1 tablet (180 mg total) by mouth daily. 04/01/18   Mikey Kirschner, MD  fluticasone (FLONASE) 50 MCG/ACT nasal spray Place 2 sprays into both nostrils daily. 10/31/18   Mikey Kirschner,  MD  fluticasone (FLOVENT HFA) 110 MCG/ACT inhaler Inhale 2 puffs into the lungs 2 (two) times daily. 04/01/18   Mikey Kirschner, MD  meloxicam (MOBIC) 15 MG tablet Take 1 tablet (15 mg total) by mouth daily. Take with food Patient not taking: Reported on 11/09/2018 03/20/18   Triplett, Tammy, PA-C  montelukast (SINGULAIR) 10 MG tablet Take 1 tablet (10 mg total) by mouth at bedtime. Patient not taking: Reported on 11/09/2018 04/01/18   Mikey Kirschner, MD  traMADol (ULTRAM) 50 MG tablet Take 1 tablet (50 mg total)  by mouth every 12 (twelve) hours as needed. Patient not taking: Reported on 11/09/2018 05/11/18   Suzan Slick, NP    Family History Family History  Problem Relation Age of Onset  . Diabetes Other   . Colon cancer Neg Hx     Social History Social History   Tobacco Use  . Smoking status: Never Smoker  . Smokeless tobacco: Never Used  Substance Use Topics  . Alcohol use: No  . Drug use: No     Allergies   Patient has no known allergies.   Review of Systems Review of Systems  Gastrointestinal: Positive for abdominal pain.  All other systems reviewed and are negative.    Physical Exam Updated Vital Signs BP (!) 148/112 (BP Location: Right Arm)   Pulse (!) 105   Temp 98.1 F (36.7 C) (Oral)   Resp 18   SpO2 98%   Physical Exam Vitals signs and nursing note reviewed.    28 year old male, resting comfortably and in no acute distress. Vital signs are significant for elevated blood pressure and borderline elevated heart rate. Oxygen saturation is 98%, which is normal. Head is normocephalic and atraumatic. PERRLA, EOMI. Oropharynx is clear. Neck is nontender and supple without adenopathy or JVD. Back is nontender and there is no CVA tenderness. Lungs are clear without rales, wheezes, or rhonchi. Chest is nontender. Heart has regular rate and rhythm without murmur. Abdomen is soft, flat, nontender without masses or hepatosplenomegaly and peristalsis is normoactive. Extremities have no cyanosis or edema, full range of motion is present. Skin is warm and dry without rash. Neurologic: Mental status is normal, cranial nerves are intact, there are no motor or sensory deficits.  ED Treatments / Results  Labs (all labs ordered are listed, but only abnormal results are displayed) Labs Reviewed  COMPREHENSIVE METABOLIC PANEL - Abnormal; Notable for the following components:      Result Value   Creatinine, Ser 1.32 (*)    Total Protein 8.5 (*)    All other components  within normal limits  CBC - Abnormal; Notable for the following components:   WBC 12.4 (*)    All other components within normal limits  LIPASE, BLOOD  URINALYSIS, ROUTINE W REFLEX MICROSCOPIC    Procedures Procedures (including critical care time)  Medications Ordered in ED Medications  sodium chloride flush (NS) 0.9 % injection 3 mL (has no administration in time range)     Initial Impression / Assessment and Plan / ED Course  I have reviewed the triage vital signs and the nursing notes.  Pertinent labs & imaging results that were available during my care of the patient were reviewed by me and considered in my medical decision making (see chart for details).  Epigastric pain.  Consider gastritis, peptic ulcer disease, biliary colic, pancreatitis.  Screening labs show mild leukocytosis and mild renal insufficiency, otherwise unremarkable.  Creatinine is only minimally increased from last  value from December 2016.  Old records are reviewed confirming adenomatous polyp identified and removed in 2013, was due for screening colonoscopy in 2018.  We will give therapeutic trial of a GI cocktail.  At this point, no indication for abdominal imaging.  He had partial relief with a GI cocktail.  At this point, and no indication for further work-up.  He is given a dose of pantoprazole and advised use over-the-counter omeprazole.  Encouraged to go back to his gastroenterologist for screening colonoscopy which was due 2 years ago.  Return precautions discussed.  Blood pressure was repeated and is normal.  Final Clinical Impressions(s) / ED Diagnoses   Final diagnoses:  Epigastric pain  Renal insufficiency    ED Discharge Orders    None       Delora Fuel, MD 26/94/85 0021

## 2019-01-15 NOTE — Discharge Instructions (Addendum)
Take omeprazole (Prilosec OTC) once a day.  Take antacids (like Tums, Maalox, Mylanta, Pepto Bismol) as needed.  Return if symptoms are getting worse.  You are past due for a colonoscopy. At your colonoscopy in 2013, a polyp was removed. You should have a colonoscopy every five years to see if new polyps have grown, and if they have, have them removed before they become cancer.

## 2019-01-26 ENCOUNTER — Encounter: Payer: Self-pay | Admitting: Gastroenterology

## 2019-01-26 ENCOUNTER — Other Ambulatory Visit: Payer: Self-pay

## 2019-01-26 ENCOUNTER — Telehealth: Payer: Self-pay | Admitting: *Deleted

## 2019-01-26 ENCOUNTER — Ambulatory Visit (INDEPENDENT_AMBULATORY_CARE_PROVIDER_SITE_OTHER): Payer: Self-pay | Admitting: Gastroenterology

## 2019-01-26 DIAGNOSIS — R1011 Right upper quadrant pain: Secondary | ICD-10-CM

## 2019-01-26 DIAGNOSIS — R1013 Epigastric pain: Secondary | ICD-10-CM

## 2019-01-26 DIAGNOSIS — Z86010 Personal history of colonic polyps: Secondary | ICD-10-CM

## 2019-01-26 NOTE — Patient Instructions (Addendum)
Please have right upper quadrant ultrasound completed at [facility] in the next 1-2 weeks. We will reach out to you after the procedure with results and further recommendations.   We will schedule you for a colonoscopy in the near future with Dr. Oneida Alar.   Continue to avoid NSAIDs including ibuprofen, Advil, Aleve, and goody's powders.     We will see you back in 3-4 months after your procedure.   Please call if your symptoms get worse. If you have sever intractable pain, please go to the Emergency Department.   Aliene Altes, PA-C [facility]

## 2019-01-26 NOTE — Progress Notes (Signed)
cc'ed to pcp °

## 2019-01-26 NOTE — Progress Notes (Addendum)
Referring Provider: Dr. Wolfgang Phoenix Primary Care Physician:  Mikey Kirschner, MD  Primary GI: Dr. Oneida Alar  Patient Location: Home   Provider Location: [facility] office   Reason for Visit: Abdominal Pain   Persons present on the virtual encounter, with roles: Aliene Altes, PA-C (provider); Frank Hansen (patient)   Total time (minutes) spent on medical discussion: 17 minutes   Due to COVID-19, visit was conducted using virtual method.  Visit was requested by patient.  Virtual Visit via Telephone Note Due to COVID-19, visit is conducted virtually and was requested by patient.   I connected with Frank Hansen on 01/26/19 at  8:00 AM EDT by  [email or web address] and verified that I am speaking with the correct person using two identifiers.   I discussed the limitations, risks, security and privacy concerns of performing an evaluation and management service by video and the availability of in person appointments. I also discussed with the patient that there may be a patient responsible charge related to this service. The patient expressed understanding and agreed to proceed.  Chief Complaint  Patient presents with  . Abdominal Pain    Wants to get on medicine to help with stomach pain     History of Present Illness: Frank Hansen is a 28 yo male. PMH significant for adenomatous polyps, recurrent chronic abdominal pain, and rectal bleeding.   Has had workup in the past with EGD 2013, benign other than hiatal hernia. Colonoscopy 2013 with single tubular adenoma and normal terminal ileum. Repeat colonoscopy was due 2018. Flexible sigmoidoscopy in 2014 due to CT showing proctitis. Minimal mucosal abnormality more consistent with enema tip trauma. No evidence of IBD. Benign biopsy. Abdominal US and HIDA in 2014 unremarkable.   Last seen in our office on 10/15/14 for follow up of abdominal pain. Had epigastric pain at that time, had recently stopped PPI. Switched pantoprazole to Nexium daily.   Recently  seen in the ED on 01/14/19 with epigastric pain.    Today he states a couple weeks ago he was eating eating a cheeseburger at a birthday party and starting having severe sharp pain and some burning in his upper abdomen and went to [facility]. No imaging at the time, labs with mild leukocytosis 12.4, Cr 1.32, and protein 8.5, otherwise unremarkable. He was given a "GI cocktail" in the ED with some relief and a dose of Protonix. He has not taken any medications for this since he was in the ED. Pain has now improved some, but he continues to have sharp pain in the epigastric area and some RUQ pain after eating. Has altered his diet at this time to avoid fatty foods to limit his pain. Prior to this, he has not been having any trouble with abdominal pain and has not been taking any chronic medications for this. States he took the Nexium back in 2016 for a little while, but he stopped having pain, stopped the Nexium, and hasn't had any trouble since.  Denies nausea, vomiting, heartburn, acid reflux, constipation, diarrhea. No fever, chills, or unintentional weight loss. No NSAIDs or goody's powders.   Reports occasional toilet tissue hematochezia. This has been present in the past and isn't worsening. No blood in the stool or in the toilet water. No rectal pain, itching, or burning.    Past Medical History:  Diagnosis Date  . Asthma   . Chronic abdominal pain   . Hiatal hernia   . Hx of adenomatous colonic polyps 09/2011   Surveillance colonoscopy  due 09/2016  . Medial meniscus tear    needs surgery, Dr. Aline Brochure to perform in near future.   . Stomach problems    related to NSAID use     Past Surgical History:  Procedure Laterality Date  . ACL graft and medial meniscus repair  12/12/10   left, needs repeat surgery for medial meniscus tear in near future, graft intact  . COLONOSCOPY  09/2011   OVF:IEPPIR rectal polyp-tubular adenoma. Normal TI. Next TCS in 09/2016.  Marland Kitchen ESOPHAGOGASTRODUODENOSCOPY  09/2011    JJO:ACZYS hiatal hernia otherwise negative exam.   . FLEXIBLE SIGMOIDOSCOPY N/A 07/13/2013   RMR: Minimal rectal mucosal abnormality consistent more with enema tip trauma than anything else.  No significant findings consistent with inflammatory bowel disease or other abnormality, S/p rectal bx.The sigmoidoscopy revealed otherwise normal     Current Meds  Medication Sig  . albuterol (PROVENTIL HFA;VENTOLIN HFA) 108 (90 Base) MCG/ACT inhaler Inhale 2 puffs into the lungs every 4 (four) hours as needed for wheezing or shortness of breath.  Marland Kitchen albuterol (PROVENTIL) (2.5 MG/3ML) 0.083% nebulizer solution Use via neb q 4 hrs prn wheezing; may alternate with inhaler  . cetirizine (ZYRTEC) 10 MG tablet Take 1 tablet (10 mg total) by mouth daily.  . fluticasone (FLONASE) 50 MCG/ACT nasal spray Place 2 sprays into both nostrils daily.  . fluticasone (FLOVENT HFA) 110 MCG/ACT inhaler Inhale 2 puffs into the lungs 2 (two) times daily.     Family History  Problem Relation Age of Onset  . Diabetes Other   . Colon cancer Neg Hx     Social History   Socioeconomic History  . Marital status: Single    Spouse name: Not on file  . Number of children: 0  . Years of education: Not on file  . Highest education level: Not on file  Occupational History  . 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 Financial resource strain: Not on file  . Food insecurity:    Worry: Not on file    Inability: Not on file  . Transportation needs:    Medical: Not on file    Non-medical: Not on file  Tobacco Use  . Smoking status: Never Smoker  . Smokeless tobacco: Never Used  Substance and Sexual Activity  . Alcohol use: No  . Drug use: No  . Sexual activity: Not on file  Lifestyle  . Physical activity:    Days per week: Not on file    Minutes per session: Not on file  . Stress: Not on file  Relationships  . Social connections:     Talks on phone: Not on file    Gets together: Not on file    Attends religious service: Not on file    Active member of club or organization: Not on file    Attends meetings of clubs or organizations: Not on file    Relationship status: Not on file  Other Topics Concern  . Not on file  Social History Narrative  . Not on file       Review of Systems: Gen: Denies lightheadedness, dizziness CV: Denies chest pain, palpitations Resp: Denies dyspnea at rest, cough GI: see HPI Heme: See HPI  Observations/Objective: No distress. Unable to perform physical exam due to video encounter.   Assessment and Plan: 28 yo male with history of chronic abdominal pain with negative extensive workup about 6 years ago.  Has been asymptomatic for a while in regards to abdominal pain without use of any GI medications recently. Now with acute onset of epigastric and RUQ abdominal pain about 2 weeks ago while eating a cheeseburger. Pain has improved some, but still occurring postprandially. Labs in the ED at time of pain onset with mild leukocytosis and slight bump in creatinine. Suspect biliary colic. Possible gastritis, less likely PUD, doubt pancreatitis. Will proceed with RUQ [place]. If this is unrevealing, will likely order HIDA scan. If both of these are unremarkable, consider restarting PPI as this has provided some improvement of epigastric pain in the past. Also consider upper endoscopy for further evaluation if pain is persisting. Discussed ED precautions with patient. Also advised to call us back if symptoms are worsening. Continue to avoid NSAIDs.  History of tubular adenoma in 2013. Was due to surveillance in 2018. Also with tissue hematochezia occasionally. Will proceed with  TCS in the future with Dr. Oneida Alar. The risks, benefits, and alternatives have been discussed in detail with patient. They have stated understanding and desire to proceed.   This patient should do well with conscious sedation. No  anticoagulants, anxiolytics, chronic pain medications, or antidepressants. Denies drug and alcohol use.   Follow-up in 3-4 months after procedures.   Addendum: RUQ [place] on 6/12 with minimal sludge non excluded in gallbladder. Otherwise normal exam. HIDA on 02/16/19 normal. Patient without symptoms during the exam. Nursing staff has tried reaching patient multiple times to let him know about his HIDA results and follow-up on symptoms for further recommendations, but efforts have been unsuccessful. A letter has been sent. Patient has been scheduled for TCS +/- EGD with Dr. Gala Romney at this time. Propofol needed due to age.    Follow Up Instructions:    I discussed the assessment and treatment plan with the patient. The patient was provided an opportunity to ask questions and all were answered. The patient agreed with the plan and demonstrated an understanding of the instructions.   The patient was advised to call back or seek an in-person evaluation if the symptoms worsen or if the condition fails to improve as anticipated.  I provided 17 minutes of doxy.me-face-to-face time during this encounter.  Aliene Altes, PA-C [facility]

## 2019-01-26 NOTE — Telephone Encounter (Signed)
LMOVM to call back  U/s scheduled for 6/10 at 7:30am, arrival time 7:39m, npo after midnight. Also need to "hold date" for TCS +/-EGD with [facility]

## 2019-01-27 MED ORDER — PEG 3350-KCL-NA BICARB-NACL 420 G PO SOLR: 4000.0000 mL | Freq: Once | ORAL | 0 refills | Status: AC

## 2019-01-27 NOTE — Telephone Encounter (Signed)
Spoke with patient. He is aware of his U/S appt details. He is schedule for procedure 7/22 at 10:45am with [facility]. Patient aware will mail his instructions to him. Requested this be mailed to [address] in [place] [address]. I advised patient I will also send prep to [facility]. We will call regarding u/s results and decide if EGD is needed with TCS. He voiced understanding.

## 2019-01-27 NOTE — Progress Notes (Signed)
cc'd to pcp 

## 2019-01-27 NOTE — Telephone Encounter (Signed)
LMOVM

## 2019-02-01 ENCOUNTER — Ambulatory Visit (HOSPITAL_COMMUNITY): Payer: BC Managed Care – PPO

## 2019-02-03 ENCOUNTER — Other Ambulatory Visit: Payer: Self-pay

## 2019-02-03 ENCOUNTER — Ambulatory Visit (HOSPITAL_COMMUNITY)
Admission: RE | Admit: 2019-02-03 | Discharge: 2019-02-03 | Disposition: A | Discharge status: Home / Self Care | Payer: BC Managed Care – PPO | Source: Ambulatory Visit | Attending: Gastroenterology | Admitting: Gastroenterology

## 2019-02-03 DIAGNOSIS — R1013 Epigastric pain: Secondary | ICD-10-CM | POA: Diagnosis present

## 2019-02-03 DIAGNOSIS — R1011 Right upper quadrant pain: Secondary | ICD-10-CM | POA: Diagnosis present

## 2019-02-07 ENCOUNTER — Other Ambulatory Visit: Payer: Self-pay | Admitting: *Deleted

## 2019-02-07 DIAGNOSIS — Z86010 Personal history of colonic polyps: Secondary | ICD-10-CM

## 2019-02-07 DIAGNOSIS — K625 Hemorrhage of anus and rectum: Secondary | ICD-10-CM

## 2019-02-07 DIAGNOSIS — R1013 Epigastric pain: Secondary | ICD-10-CM

## 2019-02-07 DIAGNOSIS — R1011 Right upper quadrant pain: Secondary | ICD-10-CM

## 2019-02-08 ENCOUNTER — Other Ambulatory Visit: Payer: Self-pay | Admitting: *Deleted

## 2019-02-08 DIAGNOSIS — R109 Unspecified abdominal pain: Secondary | ICD-10-CM

## 2019-02-08 DIAGNOSIS — R1013 Epigastric pain: Secondary | ICD-10-CM

## 2019-02-08 DIAGNOSIS — R1011 Right upper quadrant pain: Secondary | ICD-10-CM

## 2019-02-08 NOTE — Progress Notes (Signed)
LMOM to call.

## 2019-02-08 NOTE — Progress Notes (Signed)
PT is aware and OK to schedule the HIDA.  Forwarding to [facility].

## 2019-02-14 NOTE — Progress Notes (Signed)
REVIEWED. PT IS AN [facility] PT. LAST SEEN IN [facility] FEB 2016. ALL PRIOR ENDOSCOPIES BY DR. Gala Romney. HE SHOULD BE SCHEDULED WITH DR. Gala Romney.

## 2019-02-15 ENCOUNTER — Telehealth: Payer: Self-pay | Admitting: Gastroenterology

## 2019-02-15 ENCOUNTER — Telehealth: Payer: Self-pay | Admitting: Internal Medicine

## 2019-02-15 NOTE — Telephone Encounter (Signed)
See prior message already open

## 2019-02-15 NOTE — Telephone Encounter (Signed)
[facility] Pool, this patient is an [facility] patient. He is currently scheduled for TCS with possible EGD with Dr. Oneida Alar. Can we reschedule him with Dr. Gala Romney? I was going by who was listed on his care team, which was incorrect. This has now been corrected as well.

## 2019-02-15 NOTE — Telephone Encounter (Signed)
Pt was returning  Call. [phone]

## 2019-02-15 NOTE — Telephone Encounter (Signed)
LMOVM for pt Called endo- LMOVM to cancel current orders

## 2019-02-16 ENCOUNTER — Encounter (HOSPITAL_COMMUNITY): Payer: Self-pay

## 2019-02-16 ENCOUNTER — Other Ambulatory Visit: Payer: Self-pay

## 2019-02-16 ENCOUNTER — Ambulatory Visit (HOSPITAL_COMMUNITY)
Admission: RE | Admit: 2019-02-16 | Discharge: 2019-02-16 | Disposition: A | Discharge status: Home / Self Care | Payer: BC Managed Care – PPO | Source: Ambulatory Visit | Attending: Gastroenterology | Admitting: Gastroenterology

## 2019-02-16 DIAGNOSIS — R109 Unspecified abdominal pain: Secondary | ICD-10-CM | POA: Insufficient documentation

## 2019-02-16 DIAGNOSIS — R1011 Right upper quadrant pain: Secondary | ICD-10-CM | POA: Diagnosis present

## 2019-02-16 DIAGNOSIS — R1013 Epigastric pain: Secondary | ICD-10-CM | POA: Insufficient documentation

## 2019-02-16 MED ORDER — TECHNETIUM TC 99M MEBROFENIN IV KIT
5.0000 | PACK | Freq: Once | INTRAVENOUS | Status: AC | PRN
  Administered 2019-02-16: 08:00:00 5.2 via INTRAVENOUS

## 2019-02-16 NOTE — Telephone Encounter (Signed)
Per [facility] patient procedures needs to be with propofol.

## 2019-02-16 NOTE — Telephone Encounter (Signed)
LMOVM for pt 

## 2019-02-16 NOTE — Telephone Encounter (Signed)
Patient called back. He is aware procedure cancelled with [facility]. He is aware [facility] does procedures on mon,thur. He is going to check his work schedule to see what he can do and call back to schedule.

## 2019-02-21 NOTE — Telephone Encounter (Signed)
Letter mailed

## 2019-02-21 NOTE — Telephone Encounter (Signed)
LMOVM for pt 

## 2019-02-23 ENCOUNTER — Encounter: Payer: Self-pay | Admitting: *Deleted

## 2019-02-23 ENCOUNTER — Other Ambulatory Visit: Payer: Self-pay | Admitting: *Deleted

## 2019-02-23 DIAGNOSIS — R1011 Right upper quadrant pain: Secondary | ICD-10-CM

## 2019-02-23 DIAGNOSIS — Z86010 Personal history of colonic polyps: Secondary | ICD-10-CM

## 2019-02-23 DIAGNOSIS — K625 Hemorrhage of anus and rectum: Secondary | ICD-10-CM

## 2019-02-23 DIAGNOSIS — R1013 Epigastric pain: Secondary | ICD-10-CM

## 2019-02-23 MED ORDER — PEG 3350-KCL-NA BICARB-NACL 420 G PO SOLR: 4000.0000 mL | Freq: Once | ORAL | 0 refills | Status: AC

## 2019-02-23 NOTE — Telephone Encounter (Signed)
Patient called back. He is scheduled for TCS +/-egd with [facility] with [facility] on 9/28 at 10:30am. Patient aware will mail pre-op appt and instructions to him (confirmed address). Rx sent in. Orders entered.

## 2019-02-27 ENCOUNTER — Encounter: Payer: Self-pay | Admitting: *Deleted

## 2019-03-15 ENCOUNTER — Ambulatory Visit (HOSPITAL_COMMUNITY): Admit: 2019-03-15 | Payer: BC Managed Care – PPO | Admitting: Gastroenterology

## 2019-03-15 ENCOUNTER — Encounter (HOSPITAL_COMMUNITY): Payer: Self-pay

## 2019-03-15 SURGERY — COLONOSCOPY
Anesthesia: Moderate Sedation

## 2019-03-24 ENCOUNTER — Other Ambulatory Visit: Payer: BC Managed Care – PPO

## 2019-03-24 ENCOUNTER — Other Ambulatory Visit: Payer: Self-pay

## 2019-03-24 ENCOUNTER — Telehealth: Payer: Self-pay | Admitting: Family Medicine

## 2019-03-24 ENCOUNTER — Ambulatory Visit
Admission: EM | Admit: 2019-03-24 | Discharge: 2019-03-24 | Disposition: A | Discharge status: Home / Self Care | Payer: BC Managed Care – PPO | Attending: Emergency Medicine | Admitting: Emergency Medicine

## 2019-03-24 DIAGNOSIS — Z20828 Contact with and (suspected) exposure to other viral communicable diseases: Secondary | ICD-10-CM

## 2019-03-24 DIAGNOSIS — R05 Cough: Secondary | ICD-10-CM | POA: Diagnosis not present

## 2019-03-24 DIAGNOSIS — Z20822 Contact with and (suspected) exposure to covid-19: Secondary | ICD-10-CM

## 2019-03-24 DIAGNOSIS — R0602 Shortness of breath: Secondary | ICD-10-CM

## 2019-03-24 DIAGNOSIS — J4521 Mild intermittent asthma with (acute) exacerbation: Secondary | ICD-10-CM | POA: Diagnosis not present

## 2019-03-24 MED ORDER — PREDNISONE 20 MG PO TABS: 20.0000 mg | ORAL_TABLET | Freq: Two times a day (BID) | ORAL | 0 refills | Status: AC

## 2019-03-24 MED ORDER — ALBUTEROL SULFATE HFA 108 (90 BASE) MCG/ACT IN AERS
2.0000 | INHALATION_SPRAY | Freq: Once | RESPIRATORY_TRACT | Status: AC
  Administered 2019-03-24: 2 via RESPIRATORY_TRACT

## 2019-03-24 MED ORDER — BENZONATATE 100 MG PO CAPS: 100.0000 mg | ORAL_CAPSULE | Freq: Three times a day (TID) | ORAL | 0 refills | Status: DC

## 2019-03-24 NOTE — Telephone Encounter (Signed)
Please give work excuse and let pt know how to get it. He has been tested for covid so cannot come to office

## 2019-03-24 NOTE — Telephone Encounter (Signed)
Had headache for two weeks and last night had to use his inhaler and neb machine for the first time in a couple of years. Has asthma. No fever. [place] in the [employer] and someone in the jail has covid. Had covid test today at drive up site. Advised pt to go to urgent care since he is having some sob. Urgent care number given and advised ED is breathing is worse. Pt wanted to get work excuse for his job

## 2019-03-24 NOTE — Telephone Encounter (Signed)
I would recommend giving him a work excuse for at least 7 days I agree with being seen via urgent care or ER currently we are overbooked for the day

## 2019-03-24 NOTE — ED Triage Notes (Signed)
Pt has h/o asthma , has positive covid exposure, needs note to excuse from work, pt has had sob and cough , exposure 2 weeks ago

## 2019-03-24 NOTE — Telephone Encounter (Signed)
Pt has been exposed to someone who is positive for COVID. He went and got tested today and wanted to know if there was a lab that could be done to get  Results faster. And is requesting a work note

## 2019-03-24 NOTE — Discharge Instructions (Addendum)
COVID testing done this morning at [facility].    In the meantime: You should remain isolated in your home for 10 days from symptom onset AND greater than 72 hours after symptoms resolution (absence of fever without the use of fever-reducing medication and improvement in respiratory symptoms), whichever is longer Get plenty of rest and push fluids Tessalon Perles prescribed for cough Prednisone prescribed for possible asthma flare Inhaler and nebulizer given in office.  Use as needed for shortness of breath and/or wheezing Use OTC medications like ibuprofen or tylenol as needed fever or pain Call or go to the ED if you have any new or worsening symptoms such as fever, worsening cough, shortness of breath, chest tightness, chest pain, turning blue, changes in mental status, etc..Marland Kitchen

## 2019-03-24 NOTE — ED Provider Notes (Signed)
[facility]   [id number] 03/24/19 Arrival Time: 9476  Cc: Dry cough and SOB  SUBJECTIVE:  Frank Hansen is a 28 y.o. male hx significant for asthma, who presents with intermittent dry cough, SOB, wheezing, and HA x 1 week.  Admits to positive COVID exposure at work.  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  Has tried inhaler and nebulizer at home with minimal relief.  Symptoms are made worse at night.  Denies previous symptoms in the past.   Denies fever, chills, fatigue, congestion, rhinorrhea, sore throat, SOB, wheezing, chest pain, nausea, vomiting, chest pressure, changes in bowel or bladder habits.    Is followed by GI, and has an upcoming colonoscopy for hx of blood in stool.    ROS: As per HPI.  All other pertinent ROS negative.     Past Medical History:  Diagnosis Date  . Asthma   . Chronic abdominal pain   . Hiatal hernia   . Hx of adenomatous colonic polyps 09/2011   Surveillance colonoscopy due 09/2016  . Medial meniscus tear    needs surgery, Dr. Aline Brochure to perform in near future.   . Stomach problems    related to NSAID use   Past Surgical History:  Procedure Laterality Date  . ACL graft and medial meniscus repair  12/12/10   left, needs repeat surgery for medial meniscus tear in near future, graft intact  . COLONOSCOPY  09/2011   LYY:TKPTWS rectal polyp-tubular adenoma. Normal TI. Next TCS in 09/2016.  Marland Kitchen ESOPHAGOGASTRODUODENOSCOPY  09/2011   FKC:LEXNT hiatal hernia otherwise negative exam.   . FLEXIBLE SIGMOIDOSCOPY N/A 07/13/2013   RMR: Minimal rectal mucosal abnormality consistent more with enema tip trauma than anything else.  No significant findings consistent with inflammatory bowel disease or other abnormality, S/p rectal bx.The sigmoidoscopy revealed otherwise normal   No Known Allergies No current facility-administered medications on file prior to encounter.    Current Outpatient Medications on File Prior to Encounter  Medication  Sig Dispense Refill  . albuterol (PROVENTIL HFA;VENTOLIN HFA) 108 (90 Base) MCG/ACT inhaler Inhale 2 puffs into the lungs every 4 (four) hours as needed for wheezing or shortness of breath. 1 Inhaler 3  . albuterol (PROVENTIL) (2.5 MG/3ML) 0.083% nebulizer solution Use via neb q 4 hrs prn wheezing; may alternate with inhaler 25 vial 2  . cetirizine (ZYRTEC) 10 MG tablet Take 1 tablet (10 mg total) by mouth daily. 30 tablet 1  . fluticasone (FLONASE) 50 MCG/ACT nasal spray Place 2 sprays into both nostrils daily. 16 g 1  . fluticasone (FLOVENT HFA) 110 MCG/ACT inhaler Inhale 2 puffs into the lungs 2 (two) times daily. 1 Inhaler 5    Social History   Socioeconomic History  . Marital status: Single    Spouse name: Not on file  . Number of children: 0  . Years of education: Not on file  . Highest education level: Not on file  Occupational History  . 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 Financial resource strain: Not on file  . Food insecurity    Worry: Not on file    Inability: Not on file  . Transportation needs    Medical: Not on file    Non-medical: Not on file  Tobacco Use  . Smoking status: Never Smoker  . Smokeless tobacco: Never Used  Substance and Sexual Activity  . Alcohol use: No  .  Drug use: No  . Sexual activity: Not on file  Lifestyle  . Physical activity    Days per week: Not on file    Minutes per session: Not on file  . Stress: Not on file  Relationships  . Social Herbalist on phone: Not on file    Gets together: Not on file    Attends religious service: Not on file    Active member of club or organization: Not on file    Attends meetings of clubs or organizations: Not on file    Relationship status: Not on file  . Intimate partner violence    Fear of current or ex partner: Not on file    Emotionally abused: Not on file    Physically abused: Not on file    Forced  sexual activity: Not on file  Other Topics Concern  . Not on file  Social History Narrative  . Not on file   Family History  Problem Relation Age of Onset  . Diabetes Other   . Colon cancer Neg Hx      OBJECTIVE:  Vitals:   03/24/19 1614  BP: (!) 144/84  Pulse: 94  Resp: 20  Temp: 98.6 F (37 C)  SpO2: 96%     General appearance: Alert, well-appearing nontoxic; speaking in full sentences without difficulty HEENT:NCAT; Ears: EACs clear, TMs pearly gray; Eyes: PERRL.  EOM grossly intact. Nose: nares patent without rhinorrhea; Throat: tonsils nonerythematous or enlarged, uvula midline  Neck: supple without LAD Lungs: clear to auscultation bilaterally without adventitious breath sounds; normal respiratory effort; mild cough present Heart: regular rate and rhythm.   Skin: warm and dry Psychological: alert and cooperative; normal mood and affect   ASSESSMENT & PLAN:  1. Suspected Covid-19 Virus Infection   2. Mild intermittent asthma with exacerbation     Meds ordered this encounter  Medications  . benzonatate (TESSALON) 100 MG capsule    Sig: Take 1 capsule (100 mg total) by mouth every 8 (eight) hours.    Dispense:  21 capsule    Refill:  0    Order Specific Question:   Supervising Provider    Answer:   Raylene Everts [[id number]]  . predniSONE (DELTASONE) 20 MG tablet    Sig: Take 1 tablet (20 mg total) by mouth 2 (two) times daily with a meal for 5 days.    Dispense:  10 tablet    Refill:  0    Order Specific Question:   Supervising Provider    Answer:   Raylene Everts [[id number]]  . albuterol (VENTOLIN HFA) 108 (90 Base) MCG/ACT inhaler 2 puff    COVID testing done this morning at [facility].    In the meantime: You should remain isolated in your home for 10 days from symptom onset AND greater than 72 hours after symptoms resolution (absence of fever without the use of fever-reducing medication and improvement in respiratory symptoms), whichever is longer  Get plenty of rest and push fluids Tessalon Perles prescribed for cough Prednisone prescribed for possible asthma flare Inhaler and nebulizer given in office.  Use as needed for shortness of breath and/or wheezing Use OTC medications like ibuprofen or tylenol as needed fever or pain Call or go to the ED if you have any new or worsening symptoms such as fever, worsening cough, shortness of breath, chest tightness, chest pain, turning blue, changes in mental status, etc...   Reviewed expectations re: course of current medical issues.  Questions answered. Outlined signs and symptoms indicating need for more acute intervention. Patient verbalized understanding. After Visit Summary given.          Lestine Box, PA-C 03/24/19 2012

## 2019-03-26 LAB — NOVEL CORONAVIRUS, NAA: SARS-CoV-2, NAA: NOT DETECTED

## 2019-03-27 ENCOUNTER — Encounter: Payer: Self-pay | Admitting: Family Medicine

## 2019-03-27 NOTE — Telephone Encounter (Signed)
Left message on pt's voicemail, notified that work excuse was printed & mailed to him & to call us if he has any questions

## 2019-05-17 ENCOUNTER — Other Ambulatory Visit: Payer: Self-pay

## 2019-05-17 ENCOUNTER — Encounter (HOSPITAL_COMMUNITY): Payer: Self-pay

## 2019-05-17 ENCOUNTER — Telehealth: Payer: Self-pay | Admitting: Internal Medicine

## 2019-05-17 MED ORDER — PEG 3350-KCL-NA BICARB-NACL 420 G PO SOLR: 4000.0000 mL | Freq: Once | ORAL | 0 refills | Status: AC

## 2019-05-17 NOTE — Telephone Encounter (Signed)
Pt called to say that he took his prep already thinking it was for another procedure he was having. He is scheduled with [facility] for Monday for colonoscopy/EGD with propofol. He is asking for another prep to be called into [facility]

## 2019-05-17 NOTE — Telephone Encounter (Signed)
Patient aware new Rx sent in as he accidentally mixed and drank prep for a previous procedure

## 2019-05-18 ENCOUNTER — Encounter (HOSPITAL_COMMUNITY)
Admission: RE | Admit: 2019-05-18 | Discharge: 2019-05-18 | Disposition: A | Discharge status: Home / Self Care | Payer: BC Managed Care – PPO | Source: Ambulatory Visit | Attending: Internal Medicine | Admitting: Internal Medicine

## 2019-05-19 ENCOUNTER — Other Ambulatory Visit (HOSPITAL_COMMUNITY)
Admission: RE | Admit: 2019-05-19 | Discharge: 2019-05-19 | Disposition: A | Discharge status: Home / Self Care | Payer: BC Managed Care – PPO | Source: Ambulatory Visit | Attending: Internal Medicine | Admitting: Internal Medicine

## 2019-05-21 NOTE — OR Nursing (Signed)
Patient scheduled for a TCS/EGD tomorrow. Patient did not show up for Covid testing on Friday. Dr. Gala Romney notified and said to cancel procedure due to not showing up for Covid test. I attempted to call the patient multiple times and no answer. I left the patient a message to call the [facility]. Notified Toy Baker of the above.

## 2019-05-21 NOTE — OR Nursing (Signed)
Tim Goins called and said patient called him back. Patient said he forgot about the Covid test. Patient aware not to drink prep and not to show up for procedure tomorrow. I will notify the office in the am to get patient rescheduled.

## 2019-05-22 ENCOUNTER — Telehealth: Payer: Self-pay | Admitting: *Deleted

## 2019-05-22 ENCOUNTER — Other Ambulatory Visit: Payer: Self-pay

## 2019-05-22 MED ORDER — PEG 3350-KCL-NA BICARB-NACL 420 G PO SOLR: 4000.0000 mL | ORAL | 0 refills | Status: DC

## 2019-05-22 NOTE — Telephone Encounter (Signed)
Pt called office, TCS/-/+EGD w/Propofol w/[facility] rescheduled to 08/07/19 at 10:45am. Pt stated he forgot about COVID test appt. Pt had already mixed up prep. New prep rx sent to pharmacy. Endo scheduler informed. Pt to receive pre-op phone call 08/03/19. COVID test 08/04/19 at 1:30pm. Appt letters and procedure instructions mailed to pt.

## 2019-05-22 NOTE — Telephone Encounter (Signed)
Tried to call pt, no answer, LMOVM for return call.  

## 2019-05-22 NOTE — Telephone Encounter (Signed)
Melanie called from [facility] and said that pt must be rescheduled.  He was scheduled for his procedure today and did not have his Covid screening.

## 2019-05-30 ENCOUNTER — Ambulatory Visit: Payer: BC Managed Care – PPO | Admitting: Gastroenterology

## 2019-05-30 ENCOUNTER — Other Ambulatory Visit (HOSPITAL_COMMUNITY)
Admission: RE | Admit: 2019-05-30 | Discharge: 2019-05-30 | Disposition: A | Discharge status: Home / Self Care | Payer: BC Managed Care – PPO | Source: Ambulatory Visit | Attending: Internal Medicine | Admitting: Internal Medicine

## 2019-05-30 ENCOUNTER — Encounter: Payer: Self-pay | Admitting: *Deleted

## 2019-05-30 ENCOUNTER — Encounter: Payer: Self-pay | Admitting: Gastroenterology

## 2019-05-30 ENCOUNTER — Other Ambulatory Visit: Payer: Self-pay

## 2019-05-30 ENCOUNTER — Other Ambulatory Visit: Payer: Self-pay | Admitting: *Deleted

## 2019-05-30 VITALS — BP 141/89 | HR 80 | Temp 96.9°F | Ht 67.0 in | Wt 246.8 lb

## 2019-05-30 DIAGNOSIS — K621 Rectal polyp: Secondary | ICD-10-CM | POA: Insufficient documentation

## 2019-05-30 DIAGNOSIS — R1013 Epigastric pain: Secondary | ICD-10-CM | POA: Diagnosis not present

## 2019-05-30 DIAGNOSIS — Z86010 Personal history of colonic polyps: Secondary | ICD-10-CM

## 2019-05-30 DIAGNOSIS — Z20822 Contact with and (suspected) exposure to covid-19: Secondary | ICD-10-CM

## 2019-05-30 DIAGNOSIS — K625 Hemorrhage of anus and rectum: Secondary | ICD-10-CM

## 2019-05-30 DIAGNOSIS — Z20828 Contact with and (suspected) exposure to other viral communicable diseases: Secondary | ICD-10-CM | POA: Insufficient documentation

## 2019-05-30 DIAGNOSIS — K2100 Gastro-esophageal reflux disease with esophagitis, without bleeding: Secondary | ICD-10-CM | POA: Insufficient documentation

## 2019-05-30 DIAGNOSIS — Z01812 Encounter for preprocedural laboratory examination: Secondary | ICD-10-CM | POA: Insufficient documentation

## 2019-05-30 LAB — SARS CORONAVIRUS 2 (TAT 6-24 HRS): SARS Coronavirus 2: NEGATIVE

## 2019-05-30 MED ORDER — PANTOPRAZOLE SODIUM 40 MG PO TBEC: 40.0000 mg | DELAYED_RELEASE_TABLET | Freq: Every day | ORAL | 3 refills | Status: DC

## 2019-05-30 NOTE — Addendum Note (Signed)
Addended by: Casi Westerfeld R on: 05/30/2019 03:23 PM   Modules accepted: Orders

## 2019-05-30 NOTE — Progress Notes (Signed)
Referring Provider: Mikey Kirschner, MD Primary Care Physician:  Mikey Kirschner, MD Primary GI: Dr. Gala Romney   Chief Complaint  Patient presents with  . Abdominal Pain    mid upper abd    HPI:   Frank Hansen is a 28 y.o. male presenting today with a history of chronic abdominal pain, last EGD in 2013, history of tubular adenoma in 2013 and overdue for 5-year-surveillance now. Most recently US abdomen with possible minimal sludge, HIDA normal at 46% without reproduction of pain.   Notes rectal bleeding intermittently. Paper hematochezia. No rectal pain. No itching. BM daily. Feels productive. No straining. Epigastric pain since March 2020. Lately has had pain consistently. Sharp pain. Sometimes worse after eating. No indigestion. No N/V. No melena. No dysphagia.   Past Medical History:  Diagnosis Date  . Asthma   . Chronic abdominal pain   . Hiatal hernia   . Hx of adenomatous colonic polyps 09/2011   Surveillance colonoscopy due 09/2016  . Medial meniscus tear    needs surgery, Dr. Aline Brochure to perform in near future.   . Stomach problems    related to NSAID use    Past Surgical History:  Procedure Laterality Date  . ACL graft and medial meniscus repair  12/12/10   left, needs repeat surgery for medial meniscus tear in near future, graft intact  . COLONOSCOPY  09/2011   [id number] rectal polyp-tubular adenoma. Normal TI. Next TCS in 09/2016.  Marland Kitchen ESOPHAGOGASTRODUODENOSCOPY  09/2011   [id number] hiatal hernia otherwise negative exam.   . FLEXIBLE SIGMOIDOSCOPY N/A 07/13/2013   RMR: Minimal rectal mucosal abnormality consistent more with enema tip trauma than anything else.  No significant findings consistent with inflammatory bowel disease or other abnormality, S/p rectal bx.The sigmoidoscopy revealed otherwise normal    Current Outpatient Medications  Medication Sig Dispense Refill  . albuterol (PROVENTIL HFA;VENTOLIN HFA) 108 (90 Base) MCG/ACT inhaler Inhale 2 puffs into  the lungs every 4 (four) hours as needed for wheezing or shortness of breath. 1 Inhaler 3  . albuterol (PROVENTIL) (2.5 MG/3ML) 0.083% nebulizer solution Use via neb q 4 hrs prn wheezing; may alternate with inhaler 25 vial 2  . benzonatate (TESSALON) 100 MG capsule Take 1 capsule (100 mg total) by mouth every 8 (eight) hours. (Patient taking differently: Take 100 mg by mouth as needed. ) 21 capsule 0  . cetirizine (ZYRTEC) 10 MG tablet Take 1 tablet (10 mg total) by mouth daily. (Patient taking differently: Take 10 mg by mouth as needed. ) 30 tablet 1  . fluticasone (FLONASE) 50 MCG/ACT nasal spray Place 2 sprays into both nostrils daily. (Patient taking differently: Place 2 sprays into both nostrils as needed. ) 16 g 1  . fluticasone (FLOVENT HFA) 110 MCG/ACT inhaler Inhale 2 puffs into the lungs 2 (two) times daily. (Patient taking differently: Inhale 2 puffs into the lungs as needed. ) 1 Inhaler 5  . pantoprazole (PROTONIX) 40 MG tablet Take 1 tablet (40 mg total) by mouth daily. 30 minutes before breakfast 90 tablet 3  . polyethylene glycol-electrolytes (TRILYTE) 420 g solution Take 4,000 mLs by mouth as directed. (Patient not taking: Reported on 05/30/2019) 4000 mL 0   No current facility-administered medications for this visit.     Allergies as of 05/30/2019  . (No Known Allergies)    Family History  Problem Relation Age of Onset  . Diabetes Other   . Colon cancer Neg Hx     Social History  Socioeconomic History  . Marital status: Single    Spouse name: Not on file  . Number of children: 0  . Years of education: Not on file  . Highest education level: Not on file  Occupational History  . 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 Financial resource strain: Not on file  . Food insecurity    Worry: Not on file    Inability: Not on file  . Transportation needs    Medical: Not on file    Non-medical:  Not on file  Tobacco Use  . Smoking status: Never Smoker  . Smokeless tobacco: Never Used  Substance and Sexual Activity  . Alcohol use: No  . Drug use: No  . Sexual activity: Not on file  Lifestyle  . Physical activity    Days per week: Not on file    Minutes per session: Not on file  . Stress: Not on file  Relationships  . Social Herbalist on phone: Not on file    Gets together: Not on file    Attends religious service: Not on file    Active member of club or organization: Not on file    Attends meetings of clubs or organizations: Not on file    Relationship status: Not on file  Other Topics Concern  . Not on file  Social History Narrative  . Not on file    Review of Systems: Gen: Denies fever, chills, anorexia. Denies fatigue, weakness, weight loss.  CV: Denies chest pain, palpitations, syncope, peripheral edema, and claudication. Resp: Denies dyspnea at rest, cough, wheezing, coughing up blood, and pleurisy. GI: see HPI Derm: Denies rash, itching, dry skin Psych: Denies depression, anxiety, memory loss, confusion. No homicidal or suicidal ideation.  Heme: Denies bruising, bleeding, and enlarged lymph nodes.  Physical Exam: BP (!) 141/89   Pulse 80   Temp (!) 96.9 F (36.1 C) (Temporal)   Ht 5\' 7"  (1.702 m)   Wt 246 lb 12.8 oz (111.9 kg)   BMI 38.65 kg/m  General:   Alert and oriented. No distress noted. Pleasant and cooperative.  Head:  Normocephalic and atraumatic. Eyes:  Conjuctiva clear without scleral icterus. Mouth:  Oral mucosa pink and moist. Good dentition. No lesions. Abdomen:  +BS, soft, non-tender and non-distended. No rebound or guarding. No HSM or masses noted. Msk:  Symmetrical without gross deformities. Normal posture. Extremities:  Without edema. Neurologic:  Alert and  oriented x4 Psych:  Alert and cooperative. Normal mood and affect.

## 2019-05-30 NOTE — Patient Instructions (Addendum)
I sent Protonix to the pharmacy. Please take this once each morning, 30 minutes before breakfast. It is best absorbed on an empty stomach.  We will pursue the colonoscopy and possible upper endoscopy as planned with Dr. Gala Romney. We will see if there is anything sooner available!  Please review the attached information to help with diet changes that may help with discomfort.   It was a pleasure to see you today. I want to create trusting relationships with patients to provide genuine, compassionate, and quality care. I value your feedback. If you receive a survey regarding your visit,  I greatly appreciate you taking time to fill this out.   Annitta Needs, PhD, ANP-BC [facility]    Food Choices for Gastroesophageal Reflux Disease, Adult When you have gastroesophageal reflux disease (GERD), the foods you eat and your eating habits are very important. Choosing the right foods can help ease the discomfort of GERD. Consider working with a diet and nutrition specialist (dietitian) to help you make healthy food choices. What general guidelines should I follow?  Eating plan  Choose healthy foods low in fat, such as fruits, vegetables, whole grains, low-fat dairy products, and lean meat, fish, and poultry.  Eat frequent, small meals instead of three large meals each day. Eat your meals slowly, in a relaxed setting. Avoid bending over or lying down until 2-3 hours after eating.  Limit high-fat foods such as fatty meats or fried foods.  Limit your intake of oils, butter, and shortening to less than 8 teaspoons each day.  Avoid the following: ? Foods that cause symptoms. These may be different for different people. Keep a food diary to keep track of foods that cause symptoms. ? Alcohol. ? Drinking large amounts of liquid with meals. ? Eating meals during the 2-3 hours before bed.  Cook foods using methods other than frying. This may include baking, grilling, or  broiling. Lifestyle  Maintain a healthy weight. Ask your health care provider what weight is healthy for you. If you need to lose weight, work with your health care provider to do so safely.  Exercise for at least 30 minutes on 5 or more days each week, or as told by your health care provider.  Avoid wearing clothes that fit tightly around your waist and chest.  Do not use any products that contain nicotine or tobacco, such as cigarettes and e-cigarettes. If you need help quitting, ask your health care provider.  Sleep with the head of your bed raised. Use a wedge under the mattress or blocks under the bed frame to raise the head of the bed. What foods are not recommended? The items listed may not be a complete list. Talk with your dietitian about what dietary choices are best for you. Grains Pastries or quick breads with added fat. [place] toast. Vegetables Deep fried vegetables. [place] fries. Any vegetables prepared with added fat. Any vegetables that cause symptoms. For some people this may include tomatoes and tomato products, chili peppers, onions and garlic, and horseradish. Fruits Any fruits prepared with added fat. Any fruits that cause symptoms. For some people this may include citrus fruits, such as oranges, grapefruit, pineapple, and lemons. Meats and other protein foods High-fat meats, such as fatty beef or pork, hot dogs, ribs, ham, sausage, salami and bacon. Fried meat or protein, including fried fish and fried chicken. Nuts and nut butters. Dairy Whole milk and chocolate milk. Sour cream. Cream. Ice cream. Cream cheese. Milk shakes. Beverages Coffee and tea,  with or without caffeine. Carbonated beverages. Sodas. Energy drinks. Fruit juice made with acidic fruits (such as orange or grapefruit). Tomato juice. Alcoholic drinks. Fats and oils Butter. Margarine. Shortening. Ghee. Sweets and desserts Chocolate and cocoa. Donuts. Seasoning and other foods Pepper. Peppermint and  spearmint. Any condiments, herbs, or seasonings that cause symptoms. For some people, this may include curry, hot sauce, or vinegar-based salad dressings. Summary  When you have gastroesophageal reflux disease (GERD), food and lifestyle choices are very important to help ease the discomfort of GERD.  Eat frequent, small meals instead of three large meals each day. Eat your meals slowly, in a relaxed setting. Avoid bending over or lying down until 2-3 hours after eating.  Limit high-fat foods such as fatty meat or fried foods. This information is not intended to replace advice given to you by your health care provider. Make sure you discuss any questions you have with your health care provider. Document Released: 08/10/2005 Document Revised: 12/01/2018 Document Reviewed: 08/11/2016 Elsevier Patient Education  2020 [employer].

## 2019-05-31 ENCOUNTER — Other Ambulatory Visit: Payer: Self-pay

## 2019-05-31 ENCOUNTER — Encounter (HOSPITAL_COMMUNITY)
Admission: RE | Admit: 2019-05-31 | Discharge: 2019-05-31 | Disposition: A | Discharge status: Home / Self Care | Payer: BC Managed Care – PPO | Source: Ambulatory Visit | Attending: Internal Medicine | Admitting: Internal Medicine

## 2019-05-31 ENCOUNTER — Encounter (HOSPITAL_COMMUNITY): Payer: Self-pay

## 2019-06-01 ENCOUNTER — Ambulatory Visit (HOSPITAL_COMMUNITY): Payer: BC Managed Care – PPO | Admitting: Anesthesiology

## 2019-06-01 ENCOUNTER — Encounter (HOSPITAL_COMMUNITY): Payer: Self-pay | Admitting: *Deleted

## 2019-06-01 ENCOUNTER — Encounter (HOSPITAL_COMMUNITY)
Admission: RE | Disposition: A | Discharge status: Home / Self Care | Payer: Self-pay | Source: Home / Self Care | Attending: Internal Medicine

## 2019-06-01 ENCOUNTER — Other Ambulatory Visit: Payer: Self-pay

## 2019-06-01 ENCOUNTER — Ambulatory Visit (HOSPITAL_COMMUNITY)
Admission: RE | Admit: 2019-06-01 | Discharge: 2019-06-01 | Disposition: A | Discharge status: Home / Self Care | Payer: BC Managed Care – PPO | Attending: Internal Medicine | Admitting: Internal Medicine

## 2019-06-01 DIAGNOSIS — Z79899 Other long term (current) drug therapy: Secondary | ICD-10-CM | POA: Insufficient documentation

## 2019-06-01 DIAGNOSIS — K209 Esophagitis, unspecified without bleeding: Secondary | ICD-10-CM | POA: Diagnosis not present

## 2019-06-01 DIAGNOSIS — K621 Rectal polyp: Secondary | ICD-10-CM | POA: Insufficient documentation

## 2019-06-01 DIAGNOSIS — R1013 Epigastric pain: Secondary | ICD-10-CM | POA: Insufficient documentation

## 2019-06-01 DIAGNOSIS — K2100 Gastro-esophageal reflux disease with esophagitis, without bleeding: Secondary | ICD-10-CM | POA: Insufficient documentation

## 2019-06-01 DIAGNOSIS — Z1211 Encounter for screening for malignant neoplasm of colon: Secondary | ICD-10-CM | POA: Insufficient documentation

## 2019-06-01 DIAGNOSIS — J45909 Unspecified asthma, uncomplicated: Secondary | ICD-10-CM | POA: Diagnosis not present

## 2019-06-01 DIAGNOSIS — R1011 Right upper quadrant pain: Secondary | ICD-10-CM | POA: Diagnosis not present

## 2019-06-01 DIAGNOSIS — Z86010 Personal history of colonic polyps: Secondary | ICD-10-CM | POA: Diagnosis not present

## 2019-06-01 HISTORY — PX: POLYPECTOMY: SHX5525

## 2019-06-01 HISTORY — PX: ESOPHAGOGASTRODUODENOSCOPY (EGD) WITH PROPOFOL: SHX5813

## 2019-06-01 HISTORY — PX: COLONOSCOPY WITH PROPOFOL: SHX5780

## 2019-06-01 SURGERY — COLONOSCOPY WITH PROPOFOL
Anesthesia: General

## 2019-06-01 MED ORDER — LACTATED RINGERS IV SOLN
INTRAVENOUS | Status: DC | PRN
  Administered 2019-06-01: 09:00:00 via INTRAVENOUS

## 2019-06-01 MED ORDER — PROPOFOL 10 MG/ML IV BOLUS
INTRAVENOUS | Status: AC
  Filled 2019-06-01: qty 40

## 2019-06-01 MED ORDER — GLYCOPYRROLATE PF 0.2 MG/ML IJ SOSY
PREFILLED_SYRINGE | INTRAMUSCULAR | Status: AC
  Filled 2019-06-01: qty 1

## 2019-06-01 MED ORDER — PROPOFOL 10 MG/ML IV BOLUS
INTRAVENOUS | Status: DC | PRN
  Administered 2019-06-01 (×2): 20 mg via INTRAVENOUS
  Administered 2019-06-01: 50 mg via INTRAVENOUS

## 2019-06-01 MED ORDER — PROPOFOL 500 MG/50ML IV EMUL
INTRAVENOUS | Status: DC | PRN
  Administered 2019-06-01: 150 ug/kg/min via INTRAVENOUS

## 2019-06-01 MED ORDER — ONDANSETRON HCL 4 MG/2ML IJ SOLN: 4.0000 mg | Freq: Once | INTRAMUSCULAR | Status: DC | PRN

## 2019-06-01 MED ORDER — LACTATED RINGERS IV SOLN
Freq: Once | INTRAVENOUS | Status: AC
  Administered 2019-06-01: 1000 mL via INTRAVENOUS

## 2019-06-01 MED ORDER — KETAMINE HCL 10 MG/ML IJ SOLN
INTRAMUSCULAR | Status: DC | PRN
  Administered 2019-06-01 (×2): 10 mg via INTRAVENOUS

## 2019-06-01 MED ORDER — MIDAZOLAM HCL 2 MG/2ML IJ SOLN
INTRAMUSCULAR | Status: AC
  Filled 2019-06-01: qty 2

## 2019-06-01 MED ORDER — KETAMINE HCL 50 MG/5ML IJ SOSY
PREFILLED_SYRINGE | INTRAMUSCULAR | Status: AC
  Filled 2019-06-01: qty 5

## 2019-06-01 NOTE — Anesthesia Preprocedure Evaluation (Signed)
Anesthesia Evaluation  Patient identified by MRN, date of birth, ID band Patient awake    Reviewed: Allergy & Precautions, NPO status , Patient's Chart, lab work & pertinent test results  History of Anesthesia Complications Negative for: history of anesthetic complications  Airway Mallampati: III  TM Distance: >3 FB Neck ROM: Full    Dental no notable dental hx.    Pulmonary asthma ,    Pulmonary exam normal breath sounds clear to auscultation       Cardiovascular negative cardio ROS Normal cardiovascular exam Rhythm:Regular Rate:Normal     Neuro/Psych negative neurological ROS  negative psych ROS   GI/Hepatic Neg liver ROS, hiatal hernia, GERD  Medicated,  Endo/Other  negative endocrine ROS  Renal/GU negative Renal ROS  negative genitourinary   Musculoskeletal negative musculoskeletal ROS (+)   Abdominal   Peds  Hematology negative hematology ROS (+)   Anesthesia Other Findings   Reproductive/Obstetrics negative OB ROS                             Anesthesia Physical Anesthesia Plan  ASA: II  Anesthesia Plan: General   Post-op Pain Management:    Induction: Intravenous  PONV Risk Score and Plan: TIVA  Airway Management Planned: Nasal Cannula, Natural Airway and Simple Face Mask  Additional Equipment:   Intra-op Plan:   Post-operative Plan:   Informed Consent: I have reviewed the patients History and Physical, chart, labs and discussed the procedure including the risks, benefits and alternatives for the proposed anesthesia with the patient or authorized representative who has indicated his/her understanding and acceptance.     Dental advisory given  Plan Discussed with: CRNA  Anesthesia Plan Comments:         Anesthesia Quick Evaluation

## 2019-06-01 NOTE — Op Note (Signed)
[facility] Patient Name: Frank Hansen Procedure Date: 06/01/2019 9:34 AM MRN: [id number] Date of Birth: [birth year] Attending MD: Norvel Richards , MD CSN: [id number] Age: 28 Admit Type: Outpatient Procedure:                Upper GI endoscopy Indications:              Epigastric abdominal pain Providers:                Norvel Richards, MD, Otis Peak B. Sharon Seller, RN,                            Charlsie Quest. Theda Sers RN, RN, Raphael Gibney, Technician Referring MD:              Medicines:                Propofol per Anesthesia Complications:            No immediate complications. Estimated Blood Loss:     Estimated blood loss: none. Procedure:                Pre-Anesthesia Assessment:                           - Prior to the procedure, a History and Physical                            was performed, and patient medications and                            allergies were reviewed. The patient's tolerance of                            previous anesthesia was also reviewed. The risks                            and benefits of the procedure and the sedation                            options and risks were discussed with the patient.                            All questions were answered, and informed consent                            was obtained. Prior Anticoagulants: The patient has                            taken no previous anticoagulant or antiplatelet                            agents. ASA Grade Assessment: II - A patient with                            mild systemic disease. After reviewing the risks  and benefits, the patient was deemed in                            satisfactory condition to undergo the procedure.                           After obtaining informed consent, the endoscope was                            passed under direct vision. Throughout the                            procedure, the patient's blood pressure, pulse, and                  oxygen saturations were monitored continuously. The                            [id number]-[id number] scope was introduced through the                            mouth, and advanced to the second part of duodenum.                            The upper GI endoscopy was accomplished without                            difficulty. The patient tolerated the procedure                            well. Scope In: Scope Out: Findings:      Esophagitis was found. Multiple erosions within 5 mm straddling the GE       junction. No Barrett's epithelium seen.      The entire examined stomach was normal.      The duodenal bulb and second portion of the duodenum were normal. Impression:               -Mild erosive reflux esophagitis.                           - Normal stomach.                           - Normal duodenal bulb and second portion of the                            duodenum.                           - No specimens collected. Patient states she is                            currently not taking Protonix. Moderate Sedation:      Moderate (conscious) sedation was personally administered by an       anesthesia professional. The following parameters were monitored: oxygen       saturation, heart  rate, blood pressure, respiratory rate, EKG, adequacy       of pulmonary ventilation, and response to care. Recommendation:           - Patient has a contact number available for                            emergencies. The signs and symptoms of potential                            delayed complications were discussed with the                            patient. Return to normal activities tomorrow.                            Written discharge instructions were provided to the                            patient.                           - Resume previous diet. Stop Protonix.                           - Continue present medications. Begin Nexium 40 mg                            daily. See  colonoscopy report. Visit with [place] in 3                            months Procedure Code(s):        --- Professional ---                           [phone], Esophagogastroduodenoscopy, flexible,                            transoral; diagnostic, including collection of                            specimen(s) by brushing or washing, when performed                            (separate procedure) Diagnosis Code(s):        --- Professional ---                           K20.9, Esophagitis, unspecified                           R10.13, Epigastric pain CPT copyright 2019 American Medical Association. All rights reserved. The codes documented in this report are preliminary and upon coder review may  be revised to meet current compliance requirements. [name]. [name], MD Norvel Richards, MD 06/01/2019 10:28:44 AM This report has been signed electronically. Number of Addenda: 0

## 2019-06-01 NOTE — Op Note (Signed)
[facility] Patient Name: Frank Hansen Procedure Date: 06/01/2019 10:04 AM MRN: [id number] Date of Birth: June 29, 1991 Attending MD: Norvel Richards , MD CSN: [id number] Age: 28 Admit Type: Outpatient Procedure:                Colonoscopy Indications:              High risk colon cancer surveillance: Personal                            history of colonic polyps Providers:                Norvel Richards, MD, Charlsie Quest. Theda Sers RN, RN,                            Raphael Gibney, Technician Referring MD:              Medicines:                Propofol per Anesthesia Complications:            No immediate complications. Estimated Blood Loss:     Estimated blood loss was minimal. Estimated blood                            loss was minimal. Procedure:                Pre-Anesthesia Assessment:                           - Prior to the procedure, a History and Physical                            was performed, and patient medications and                            allergies were reviewed. The patient's tolerance of                            previous anesthesia was also reviewed. The risks                            and benefits of the procedure and the sedation                            options and risks were discussed with the patient.                            All questions were answered, and informed consent                            was obtained. Prior Anticoagulants: The patient has                            taken no previous anticoagulant or antiplatelet  agents. ASA Grade Assessment: II - A patient with                            mild systemic disease. After reviewing the risks                            and benefits, the patient was deemed in                            satisfactory condition to undergo the procedure.                           After obtaining informed consent, the colonoscope                            was passed under direct  vision. Throughout the                            procedure, the patient's blood pressure, pulse, and                            oxygen saturations were monitored continuously. The                            [id number] scope was introduced through                            the anus and advanced to the the cecum, identified                            by appendiceal orifice and ileocecal valve. The                            colonoscopy was performed without difficulty. The                            patient tolerated the procedure well. The quality                            of the bowel preparation was adequate. Scope In: 10:07:31 AM Scope Out: 10:16:49 AM Scope Withdrawal Time: 0 hours 6 minutes 28 seconds  Total Procedure Duration: 0 hours 9 minutes 18 seconds  Findings:      The perianal and digital rectal examinations were normal.      A 5 mm polyp was found in the rectum. The polyp was sessile. The polyp       was removed with a cold snare. Resection and retrieval were complete.       Estimated blood loss was minimal.      The exam was otherwise without abnormality on direct and retroflexion       views. Impression:               - One 5 mm polyp in the rectum, removed with a cold  snare. Resected and retrieved.                           - The examination was otherwise normal on direct                            and retroflexion views. Moderate Sedation:      Moderate (conscious) sedation was personally administered by an       anesthesia professional. The following parameters were monitored: oxygen       saturation, heart rate, blood pressure, respiratory rate, EKG, adequacy       of pulmonary ventilation, and response to care. Recommendation:           - Patient has a contact number available for                            emergencies. The signs and symptoms of potential                            delayed complications were discussed with the                             patient. Return to normal activities tomorrow.                            Written discharge instructions were provided to the                            patient.                           - Resume previous diet.                           - Continue present medications.                           - Repeat colonoscopy date to be determined after                            pending pathology results are reviewed for                            surveillance based on pathology results.                           - Return to GI office in 3 months. See EGD report. Procedure Code(s):        --- Professional ---                           [phone], Colonoscopy, flexible; with removal of                            tumor(s), polyp(s), or other lesion(s) by snare  technique Diagnosis Code(s):        --- Professional ---                           Z86.010, Personal history of colonic polyps                           K62.1, Rectal polyp CPT copyright 2019 American Medical Association. All rights reserved. The codes documented in this report are preliminary and upon coder review may  be revised to meet current compliance requirements. [name]. Sahra Converse, MD Norvel Richards, MD 06/01/2019 10:31:02 AM This report has been signed electronically. Number of Addenda: 0

## 2019-06-01 NOTE — Assessment & Plan Note (Signed)
Chronic abdominal pain with known history of chronic GERD, previously noting improvement with PPI therapy in the past but currently not taking. Will start on Protonix once daily. EGD at time of colonoscopy. GERD diet provided.

## 2019-06-01 NOTE — Anesthesia Postprocedure Evaluation (Signed)
Anesthesia Post Note  Patient: Frank Hansen  Procedure(s) Performed: COLONOSCOPY WITH PROPOFOL (N/A ) ESOPHAGOGASTRODUODENOSCOPY (EGD) WITH PROPOFOL (N/A ) POLYPECTOMY  Patient location during evaluation: PACU Anesthesia Type: General Level of consciousness: awake Pain management: pain level controlled Vital Signs Assessment: post-procedure vital signs reviewed and stable Respiratory status: spontaneous breathing Cardiovascular status: blood pressure returned to baseline and stable Postop Assessment: no apparent nausea or vomiting Anesthetic complications: no     Last Vitals:  Vitals:   06/01/19 0816  Pulse: 77  Resp: 20  Temp: 36.8 C    Last Pain:  Vitals:   06/01/19 0953  TempSrc:   PainSc: 0-No pain                 Anamarie Hunn

## 2019-06-01 NOTE — Transfer of Care (Signed)
Immediate Anesthesia Transfer of Care Note  Patient: Frank Hansen  Procedure(s) Performed: COLONOSCOPY WITH PROPOFOL (N/A ) ESOPHAGOGASTRODUODENOSCOPY (EGD) WITH PROPOFOL (N/A ) POLYPECTOMY  Patient Location: PACU  Anesthesia Type:General  Level of Consciousness: awake  Airway & Oxygen Therapy: Patient Spontanous Breathing  Post-op Assessment: Report given to RN  Post vital signs: Reviewed  Last Vitals:  Vitals Value Taken Time  BP    Temp    Pulse    Resp    SpO2      Last Pain:  Vitals:   06/01/19 0953  TempSrc:   PainSc: 0-No pain      Patients Stated Pain Goal: 6 ([id number])  Complications: No apparent anesthesia complications

## 2019-06-01 NOTE — Assessment & Plan Note (Signed)
History of tubular adenomas. Surveillance now.

## 2019-06-01 NOTE — H&P (Signed)
@LOGO @   Primary Care Physician:  Mikey Kirschner, MD Primary Gastroenterologist:  Dr. Gala Romney  Pre-Procedure History & Physical: HPI:  Frank Hansen is a 28 y.o. male here for further evaluation of epigastric pain.  No longer taking Protonix.  History of colonic adenoma here for surveillance colonoscopy diagnostic EGD.  Patient denies dysphagia. Past Medical History:  Diagnosis Date  . Asthma   . Chronic abdominal pain   . Hiatal hernia   . Hx of adenomatous colonic polyps 09/2011   Surveillance colonoscopy due 09/2016  . Medial meniscus tear    needs surgery, Dr. Aline Brochure to perform in near future.   . Stomach problems    related to NSAID use    Past Surgical History:  Procedure Laterality Date  . ACL graft and medial meniscus repair  12/12/10   left, needs repeat surgery for medial meniscus tear in near future, graft intact  . COLONOSCOPY  09/2011   [id number] rectal polyp-tubular adenoma. Normal TI. Next TCS in 09/2016.  Marland Kitchen ESOPHAGOGASTRODUODENOSCOPY  09/2011   [id number] hiatal hernia otherwise negative exam.   . FLEXIBLE SIGMOIDOSCOPY N/A 07/13/2013   RMR: Minimal rectal mucosal abnormality consistent more with enema tip trauma than anything else.  No significant findings consistent with inflammatory bowel disease or other abnormality, S/p rectal bx.The sigmoidoscopy revealed otherwise normal    Prior to Admission medications   Medication Sig Start Date End Date Taking? Authorizing Provider  albuterol (PROVENTIL HFA;VENTOLIN HFA) 108 (90 Base) MCG/ACT inhaler Inhale 2 puffs into the lungs every 4 (four) hours as needed for wheezing or shortness of breath. 04/01/18   Mikey Kirschner, MD  albuterol (PROVENTIL) (2.5 MG/3ML) 0.083% nebulizer solution Use via neb q 4 hrs prn wheezing; may alternate with inhaler 04/01/18   Mikey Kirschner, MD  benzonatate (TESSALON) 100 MG capsule Take 1 capsule (100 mg total) by mouth every 8 (eight) hours. Patient taking differently: Take 100 mg by  mouth as needed.  03/24/19   Wurst, [place], PA-C  cetirizine (ZYRTEC) 10 MG tablet Take 1 tablet (10 mg total) by mouth daily. Patient taking differently: Take 10 mg by mouth as needed.  04/01/18   Mikey Kirschner, MD  fluticasone (FLONASE) 50 MCG/ACT nasal spray Place 2 sprays into both nostrils daily. Patient taking differently: Place 2 sprays into both nostrils as needed.  10/31/18   Mikey Kirschner, MD  fluticasone (FLOVENT HFA) 110 MCG/ACT inhaler Inhale 2 puffs into the lungs 2 (two) times daily. Patient taking differently: Inhale 2 puffs into the lungs as needed.  04/01/18   Mikey Kirschner, MD  pantoprazole (PROTONIX) 40 MG tablet Take 1 tablet (40 mg total) by mouth daily. 30 minutes before breakfast 05/30/19   Annitta Needs, NP  polyethylene glycol-electrolytes (TRILYTE) 420 g solution Take 4,000 mLs by mouth as directed. Patient not taking: Reported on 05/30/2019 05/22/19   Daneil Dolin, MD    Allergies as of 02/23/2019  . (No Known Allergies)    Family History  Problem Relation Age of Onset  . Diabetes Other   . Colon cancer Neg Hx     Social History   Socioeconomic History  . Marital status: Single    Spouse name: Not on file  . Number of children: 0  . Years of education: Not on file  . Highest education level: Not on file  Occupational History  . 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 Financial resource strain: Not on file  . Food insecurity    Worry: Not on file    Inability: Not on file  . Transportation needs    Medical: Not on file    Non-medical: Not on file  Tobacco Use  . Smoking status: Never Smoker  . Smokeless tobacco: Never Used  Substance and Sexual Activity  . Alcohol use: No  . Drug use: No  . Sexual activity: Not on file  Lifestyle  . Physical activity    Days per week: Not on file    Minutes per session: Not on file  . Stress: Not on file  Relationships   . Social Herbalist on phone: Not on file    Gets together: Not on file    Attends religious service: Not on file    Active member of club or organization: Not on file    Attends meetings of clubs or organizations: Not on file    Relationship status: Not on file  . Intimate partner violence    Fear of current or ex partner: Not on file    Emotionally abused: Not on file    Physically abused: Not on file    Forced sexual activity: Not on file  Other Topics Concern  . Not on file  Social History Narrative  . Not on file    Review of Systems: See HPI, otherwise negative ROS  Physical Exam: Pulse 77   Temp 98.2 F (36.8 C) (Oral)   Resp 20  General:   Alert,  Well-developed, well-nourished, pleasant and cooperative in NAD Mouth:  No deformity or lesions. Neck:  Supple; no masses or thyromegaly. No significant cervical adenopathy. Lungs:  Clear throughout to auscultation.   No wheezes, crackles, or rhonchi. No acute distress. Heart:  Regular rate and rhythm; no murmurs, clicks, rubs,  or gallops. Abdomen: Non-distended, normal bowel sounds.  Soft and nontender without appreciable mass or hepatosplenomegaly.  Pulses:  Normal pulses noted. Extremities:  Without clubbing or edema.  Impression/Plan: Epigastric pain/GERD history colonic adenoma.  Here for diagnostic EGD and colonoscopy per plan.  The risks, benefits, limitations, imponderables and alternatives regarding both EGD and colonoscopy have been reviewed with the patient. Questions have been answered. All parties agreeable.    Notice: This dictation was prepared with Dragon dictation along with smaller phrase technology. Any transcriptional errors that result from this process are unintentional and may not be corrected upon review.

## 2019-06-01 NOTE — Discharge Instructions (Signed)
Colonoscopy Discharge Instructions  Read the instructions outlined below and refer to this sheet in the next few weeks. These discharge instructions provide you with general information on caring for yourself after you leave the hospital. Your doctor may also give you specific instructions. While your treatment has been planned according to the most current medical practices available, unavoidable complications occasionally occur. If you have any problems or questions after discharge, call Dr. Gala Romney at [phone]. ACTIVITY  You may resume your regular activity, but move at a slower pace for the next 24 hours.   Take frequent rest periods for the next 24 hours.   Walking will help get rid of the air and reduce the bloated feeling in your belly (abdomen).   No driving for 24 hours (because of the medicine (anesthesia) used during the test).    Do not sign any important legal documents or operate any machinery for 24 hours (because of the anesthesia used during the test).  NUTRITION  Drink plenty of fluids.   You may resume your normal diet as instructed by your doctor.   Begin with a light meal and progress to your normal diet. Heavy or fried foods are harder to digest and may make you feel sick to your stomach (nauseated).   Avoid alcoholic beverages for 24 hours or as instructed.  MEDICATIONS  You may resume your normal medications unless your doctor tells you otherwise.  WHAT YOU CAN EXPECT TODAY  Some feelings of bloating in the abdomen.   Passage of more gas than usual.   Spotting of blood in your stool or on the toilet paper.  IF YOU HAD POLYPS REMOVED DURING THE COLONOSCOPY:  No aspirin products for 7 days or as instructed.   No alcohol for 7 days or as instructed.   Eat a soft diet for the next 24 hours.  FINDING OUT THE RESULTS OF YOUR TEST Not all test results are available during your visit. If your test results are not back during the visit, make an appointment  with your caregiver to find out the results. Do not assume everything is normal if you have not heard from your caregiver or the medical facility. It is important for you to follow up on all of your test results.  SEEK IMMEDIATE MEDICAL ATTENTION IF:  You have more than a spotting of blood in your stool.   Your belly is swollen (abdominal distention).   You are nauseated or vomiting.   You have a temperature over 101.   You have abdominal pain or discomfort that is severe or gets worse throughout the day.   Colonoscopy Discharge Instructions  Read the instructions outlined below and refer to this sheet in the next few weeks. These discharge instructions provide you with general information on caring for yourself after you leave the hospital. Your doctor may also give you specific instructions. While your treatment has been planned according to the most current medical practices available, unavoidable complications occasionally occur. If you have any problems or questions after discharge, call Dr. Gala Romney at [phone]. ACTIVITY  You may resume your regular activity, but move at a slower pace for the next 24 hours.   Take frequent rest periods for the next 24 hours.   Walking will help get rid of the air and reduce the bloated feeling in your belly (abdomen).   No driving for 24 hours (because of the medicine (anesthesia) used during the test).    Do not sign any important legal documents  or operate any machinery for 24 hours (because of the anesthesia used during the test).  NUTRITION  Drink plenty of fluids.   You may resume your normal diet as instructed by your doctor.   Begin with a light meal and progress to your normal diet. Heavy or fried foods are harder to digest and may make you feel sick to your stomach (nauseated).   Avoid alcoholic beverages for 24 hours or as instructed.  MEDICATIONS  You may resume your normal medications unless your doctor tells you otherwise.    WHAT YOU CAN EXPECT TODAY  Some feelings of bloating in the abdomen.   Passage of more gas than usual.   Spotting of blood in your stool or on the toilet paper.  IF YOU HAD POLYPS REMOVED DURING THE COLONOSCOPY:  No aspirin products for 7 days or as instructed.   No alcohol for 7 days or as instructed.   Eat a soft diet for the next 24 hours.  FINDING OUT THE RESULTS OF YOUR TEST Not all test results are available during your visit. If your test results are not back during the visit, make an appointment with your caregiver to find out the results. Do not assume everything is normal if you have not heard from your caregiver or the medical facility. It is important for you to follow up on all of your test results.  SEEK IMMEDIATE MEDICAL ATTENTION IF:  You have more than a spotting of blood in your stool.   Your belly is swollen (abdominal distention).   You are nauseated or vomiting.   You have a temperature over 101.   You have abdominal pain or discomfort that is severe or gets worse throughout the day.  EGD Discharge instructions Please read the instructions outlined below and refer to this sheet in the next few weeks. These discharge instructions provide you with general information on caring for yourself after you leave the hospital. Your doctor may also give you specific instructions. While your treatment has been planned according to the most current medical practices available, unavoidable complications occasionally occur. If you have any problems or questions after discharge, please call your doctor. ACTIVITY  You may resume your regular activity but move at a slower pace for the next 24 hours.   Take frequent rest periods for the next 24 hours.   Walking will help expel (get rid of) the air and reduce the bloated feeling in your abdomen.   No driving for 24 hours (because of the anesthesia (medicine) used during the test).   You may shower.   Do not sign any  important legal documents or operate any machinery for 24 hours (because of the anesthesia used during the test).  NUTRITION  Drink plenty of fluids.   You may resume your normal diet.   Begin with a light meal and progress to your normal diet.   Avoid alcoholic beverages for 24 hours or as instructed by your caregiver.  MEDICATIONS  You may resume your normal medications unless your caregiver tells you otherwise.  WHAT YOU CAN EXPECT TODAY  You may experience abdominal discomfort such as a feeling of fullness or gas pains.  FOLLOW-UP  Your doctor will discuss the results of your test with you.  SEEK IMMEDIATE MEDICAL ATTENTION IF ANY OF THE FOLLOWING OCCUR:  Excessive nausea (feeling sick to your stomach) and/or vomiting.   Severe abdominal pain and distention (swelling).   Trouble swallowing.   Temperature over 101 F (37.8  C).   Rectal bleeding or vomiting of blood.    GERD information provided  Polyp information provided  Stop Protonix; begin Nexium 40 mg daily  Office visit with [place] in 3 months  Further recommendations to follow pending review of pathology report  I discussed my findings and recommendations with Abigail Butts at [phone]     Gastroesophageal Reflux Disease, Adult Gastroesophageal reflux (GER) happens when acid from the stomach flows up into the tube that connects the mouth and the stomach (esophagus). Normally, food travels down the esophagus and stays in the stomach to be digested. With GER, food and stomach acid sometimes move back up into the esophagus. You may have a disease called gastroesophageal reflux disease (GERD) if the reflux:  Happens often.  Causes frequent or very bad symptoms.  Causes problems such as damage to the esophagus. When this happens, the esophagus becomes sore and swollen (inflamed). Over time, GERD can make small holes (ulcers) in the lining of the esophagus. What are the causes? This condition is caused by a  problem with the muscle between the esophagus and the stomach. When this muscle is weak or not normal, it does not close properly to keep food and acid from coming back up from the stomach. The muscle can be weak because of:  Tobacco use.  Pregnancy.  Having a certain type of hernia (hiatal hernia).  Alcohol use.  Certain foods and drinks, such as coffee, chocolate, onions, and peppermint. What increases the risk? You are more likely to develop this condition if you:  Are overweight.  Have a disease that affects your connective tissue.  Use NSAID medicines. What are the signs or symptoms? Symptoms of this condition include:  Heartburn.  Difficult or painful swallowing.  The feeling of having a lump in the throat.  A bitter taste in the mouth.  Bad breath.  Having a lot of saliva.  Having an upset or bloated stomach.  Belching.  Chest pain. Different conditions can cause chest pain. Make sure you see your doctor if you have chest pain.  Shortness of breath or noisy breathing (wheezing).  Ongoing (chronic) cough or a cough at night.  Wearing away of the surface of teeth (tooth enamel).  Weight loss. How is this treated? Treatment will depend on how bad your symptoms are. Your doctor may suggest:  Changes to your diet.  Medicine.  Surgery. Follow these instructions at home: Eating and drinking   Follow a diet as told by your doctor. You may need to avoid foods and drinks such as: ? Coffee and tea (with or without caffeine). ? Drinks that contain alcohol. ? Energy drinks and sports drinks. ? Bubbly (carbonated) drinks or sodas. ? Chocolate and cocoa. ? Peppermint and mint flavorings. ? Garlic and onions. ? Horseradish. ? Spicy and acidic foods. These include peppers, chili powder, curry powder, vinegar, hot sauces, and BBQ sauce. ? Citrus fruit juices and citrus fruits, such as oranges, lemons, and limes. ? Tomato-based foods. These include red sauce,  chili, salsa, and pizza with red sauce. ? Fried and fatty foods. These include donuts, french fries, potato chips, and high-fat dressings. ? High-fat meats. These include hot dogs, rib eye steak, sausage, ham, and bacon. ? High-fat dairy items, such as whole milk, butter, and cream cheese.  Eat small meals often. Avoid eating large meals.  Avoid drinking large amounts of liquid with your meals.  Avoid eating meals during the 2-3 hours before bedtime.  Avoid lying down right after  you eat.  Do not exercise right after you eat. Lifestyle   Do not use any products that contain nicotine or tobacco. These include cigarettes, e-cigarettes, and chewing tobacco. If you need help quitting, ask your doctor.  Try to lower your stress. If you need help doing this, ask your doctor.  If you are overweight, lose an amount of weight that is healthy for you. Ask your doctor about a safe weight loss goal. General instructions  Pay attention to any changes in your symptoms.  Take over-the-counter and prescription medicines only as told by your doctor. Do not take aspirin, ibuprofen, or other NSAIDs unless your doctor says it is okay.  Wear loose clothes. Do not wear anything tight around your waist.  Raise (elevate) the head of your bed about 6 inches (15 cm).  Avoid bending over if this makes your symptoms worse.  Keep all follow-up visits as told by your doctor. This is important. Contact a doctor if:  You have new symptoms.  You lose weight and you do not know why.  You have trouble swallowing or it hurts to swallow.  You have wheezing or a cough that keeps happening.  Your symptoms do not get better with treatment.  You have a hoarse voice. Get help right away if:  You have pain in your arms, neck, jaw, teeth, or back.  You feel sweaty, dizzy, or light-headed.  You have chest pain or shortness of breath.  You throw up (vomit) and your throw-up looks like blood or coffee  grounds.  You pass out (faint).  Your poop (stool) is bloody or black.  You cannot swallow, drink, or eat. Summary  If a person has gastroesophageal reflux disease (GERD), food and stomach acid move back up into the esophagus and cause symptoms or problems such as damage to the esophagus.  Treatment will depend on how bad your symptoms are.  Follow a diet as told by your doctor.  Take all medicines only as told by your doctor. This information is not intended to replace advice given to you by your health care provider. Make sure you discuss any questions you have with your health care provider. Document Released: 01/27/2008 Document Revised: 02/16/2018 Document Reviewed: 02/16/2018 Elsevier Patient Education  [place].     Colon Polyps  Polyps are tissue growths inside the body. Polyps can grow in many places, including the large intestine (colon). A polyp may be a round bump or a mushroom-shaped growth. You could have one polyp or several. Most colon polyps are noncancerous (benign). However, some colon polyps can become cancerous over time. Finding and removing the polyps early can help prevent this. What are the causes? The exact cause of colon polyps is not known. What increases the risk? You are more likely to develop this condition if you:  Have a family history of colon cancer or colon polyps.  Are older than 72 or older than 45 if you are African American.  Have inflammatory bowel disease, such as ulcerative colitis or Crohn's disease.  Have certain hereditary conditions, such as: ? Familial adenomatous polyposis. ? Lynch syndrome. ? Turcot syndrome. ? Peutz-Jeghers syndrome.  Are overweight.  Smoke cigarettes.  Do not get enough exercise.  Drink too much alcohol.  Eat a diet that is high in fat and red meat and low in fiber.  Had childhood cancer that was treated with abdominal radiation. What are the signs or symptoms? Most polyps do not  cause symptoms. If you  have symptoms, they may include:  Blood coming from your rectum when having a bowel movement.  Blood in your stool. The stool may look dark red or black.  Abdominal pain.  A change in bowel habits, such as constipation or diarrhea. How is this diagnosed? This condition is diagnosed with a colonoscopy. This is a procedure in which a lighted, flexible scope is inserted into the anus and then passed into the colon to examine the area. Polyps are sometimes found when a colonoscopy is done as part of routine cancer screening tests. How is this treated? Treatment for this condition involves removing any polyps that are found. Most polyps can be removed during a colonoscopy. Those polyps will then be tested for cancer. Additional treatment may be needed depending on the results of testing. Follow these instructions at home: Lifestyle  Maintain a healthy weight, or lose weight if recommended by your health care provider.  Exercise every day or as told by your health care provider.  Do not use any products that contain nicotine or tobacco, such as cigarettes and e-cigarettes. If you need help quitting, ask your health care provider.  If you drink alcohol, limit how much you have: ? 0-1 drink a day for women. ? 0-2 drinks a day for men.  Be aware of how much alcohol is in your drink. In the [place] one drink equals one 12 oz bottle of beer (355 mL), one 5 oz glass of wine (148 mL), or one 1 oz shot of hard liquor (44 mL). Eating and drinking   Eat foods that are high in fiber, such as fruits, vegetables, and whole grains.  Eat foods that are high in calcium and vitamin D, such as milk, cheese, yogurt, eggs, liver, fish, and broccoli.  Limit foods that are high in fat, such as fried foods and desserts.  Limit the amount of red meat and processed meat you eat, such as hot dogs, sausage, bacon, and lunch meats. General instructions  Keep all follow-up visits as told  by your health care provider. This is important. ? This includes having regularly scheduled colonoscopies. ? Talk to your health care provider about when you need a colonoscopy. Contact a health care provider if:  You have new or worsening bleeding during a bowel movement.  You have new or increased blood in your stool.  You have a change in bowel habits.  You lose weight for no known reason. Summary  Polyps are tissue growths inside the body. Polyps can grow in many places, including the colon.  Most colon polyps are noncancerous (benign), but some can become cancerous over time.  This condition is diagnosed with a colonoscopy.  Treatment for this condition involves removing any polyps that are found. Most polyps can be removed during a colonoscopy. This information is not intended to replace advice given to you by your health care provider. Make sure you discuss any questions you have with your health care provider. Document Released: 05/06/2004 Document Revised: 11/25/2017 Document Reviewed: 11/25/2017 Elsevier Patient Education  2020 [place] These instructions provide you with information about caring for yourself after your procedure. Your health care provider may also give you more specific instructions. Your treatment has been planned according to current medical practices, but problems sometimes occur. Call your health care provider if you have any problems or questions after your procedure. What can I expect after the procedure? After your procedure, you may:  Feel sleepy for several hours.  Feel clumsy and have poor balance for several hours.  Feel forgetful about what happened after the procedure.  Have poor judgment for several hours.  Feel nauseous or vomit.  Have a sore throat if you had a breathing tube during the procedure. Follow these instructions at home: For at least 24 hours after the procedure:        Have a responsible adult stay with you. It is important to have someone help care for you until you are awake and alert.  Rest as needed.  Do not: ? Participate in activities in which you could fall or become injured. ? Drive. ? Use heavy machinery. ? Drink alcohol. ? Take sleeping pills or medicines that cause drowsiness. ? Make important decisions or sign legal documents. ? Take care of children on your own. Eating and drinking  Follow the diet that is recommended by your health care provider.  If you vomit, drink water, juice, or soup when you can drink without vomiting.  Make sure you have little or no nausea before eating solid foods. General instructions  Take over-the-counter and prescription medicines only as told by your health care provider.  If you have sleep apnea, surgery and certain medicines can increase your risk for breathing problems. Follow instructions from your health care provider about wearing your sleep device: ? Anytime you are sleeping, including during daytime naps. ? While taking prescription pain medicines, sleeping medicines, or medicines that make you drowsy.  If you smoke, do not smoke without supervision.  Keep all follow-up visits as told by your health care provider. This is important. Contact a health care provider if:  You keep feeling nauseous or you keep vomiting.  You feel light-headed.  You develop a rash.  You have a fever. Get help right away if:  You have trouble breathing. Summary  For several hours after your procedure, you may feel sleepy and have poor judgment.  Have a responsible adult stay with you for at least 24 hours or until you are awake and alert. This information is not intended to replace advice given to you by your health care provider. Make sure you discuss any questions you have with your health care provider. Document Released: 12/01/2015 Document Revised: 11/08/2017 Document Reviewed:  12/01/2015 Elsevier Patient Education  2020 [employer].

## 2019-06-01 NOTE — Assessment & Plan Note (Signed)
Intermittent rectal bleeding, likely benign anorectal source. Overdue for surveillance with history of colonic adenomas.   Proceed with TCS with Dr. Gala Romney in near future: the risks, benefits, and alternatives have been discussed with the patient in detail. The patient states understanding and desires to proceed. Propofol

## 2019-06-02 LAB — SURGICAL PATHOLOGY

## 2019-06-02 NOTE — Progress Notes (Signed)
CC'ED TO PCP 

## 2019-06-07 ENCOUNTER — Encounter (HOSPITAL_COMMUNITY): Payer: Self-pay | Admitting: Internal Medicine

## 2019-06-08 ENCOUNTER — Encounter: Payer: Self-pay | Admitting: Internal Medicine

## 2019-06-11 ENCOUNTER — Telehealth: Payer: Self-pay | Admitting: Internal Medicine

## 2019-06-11 NOTE — Telephone Encounter (Signed)
Please let pt know the polyp removed at his recent colonoscopy was benign.  I recommend he return in 7 years for a repeat colonoscopy. Send Send copy of  path to referring provider and PCP.

## 2019-06-12 NOTE — Telephone Encounter (Signed)
ON RECALL FOR TCS °

## 2019-06-13 NOTE — Telephone Encounter (Signed)
Letter mailed to the pt. 

## 2019-07-13 ENCOUNTER — Other Ambulatory Visit: Payer: Self-pay

## 2019-07-13 DIAGNOSIS — Z20822 Contact with and (suspected) exposure to covid-19: Secondary | ICD-10-CM

## 2019-07-16 LAB — NOVEL CORONAVIRUS, NAA: SARS-CoV-2, NAA: NOT DETECTED

## 2019-07-19 ENCOUNTER — Other Ambulatory Visit: Payer: Self-pay

## 2019-07-19 DIAGNOSIS — G4489 Other headache syndrome: Secondary | ICD-10-CM | POA: Diagnosis not present

## 2019-07-19 DIAGNOSIS — J0100 Acute maxillary sinusitis, unspecified: Secondary | ICD-10-CM | POA: Diagnosis not present

## 2019-07-19 DIAGNOSIS — J45909 Unspecified asthma, uncomplicated: Secondary | ICD-10-CM | POA: Insufficient documentation

## 2019-07-19 DIAGNOSIS — R519 Headache, unspecified: Secondary | ICD-10-CM | POA: Diagnosis present

## 2019-07-20 ENCOUNTER — Emergency Department (HOSPITAL_COMMUNITY): Payer: BC Managed Care – PPO

## 2019-07-20 ENCOUNTER — Emergency Department (HOSPITAL_COMMUNITY)
Admission: EM | Admit: 2019-07-20 | Discharge: 2019-07-20 | Disposition: A | Discharge status: Home / Self Care | Payer: BC Managed Care – PPO | Attending: Emergency Medicine | Admitting: Emergency Medicine

## 2019-07-20 ENCOUNTER — Encounter (HOSPITAL_COMMUNITY): Payer: Self-pay

## 2019-07-20 DIAGNOSIS — J0100 Acute maxillary sinusitis, unspecified: Secondary | ICD-10-CM

## 2019-07-20 DIAGNOSIS — G4489 Other headache syndrome: Secondary | ICD-10-CM

## 2019-07-20 MED ORDER — KETOROLAC TROMETHAMINE 30 MG/ML IJ SOLN: 30.0000 mg | Freq: Once | INTRAMUSCULAR | Status: DC

## 2019-07-20 MED ORDER — METOCLOPRAMIDE HCL 5 MG/ML IJ SOLN
10.0000 mg | Freq: Once | INTRAMUSCULAR | Status: AC
  Administered 2019-07-20: 10 mg via INTRAVENOUS
  Filled 2019-07-20: qty 2

## 2019-07-20 MED ORDER — DIPHENHYDRAMINE HCL 50 MG/ML IJ SOLN
25.0000 mg | Freq: Once | INTRAMUSCULAR | Status: AC
  Administered 2019-07-20: 05:00:00 25 mg via INTRAVENOUS
  Filled 2019-07-20: qty 1

## 2019-07-20 MED ORDER — AZITHROMYCIN 250 MG PO TABS: 250.0000 mg | ORAL_TABLET | Freq: Every day | ORAL | 0 refills | Status: AC

## 2019-07-20 NOTE — Discharge Instructions (Signed)

## 2019-07-20 NOTE — ED Provider Notes (Signed)
[facility] EMERGENCY DEPARTMENT Provider Note   CSN: [id number] Arrival date & time: 07/19/19  2335     History   Chief Complaint Chief Complaint  Patient presents with  . Headache    HPI Frank Hansen is a 28 y.o. male.     The history is provided by the patient.  Headache Pain location:  Generalized Radiates to:  Does not radiate Onset quality:  Gradual Duration:  4 weeks Timing:  Constant Progression:  Worsening Chronicity:  New Relieved by:  Nothing Worsened by:  Nothing Associated symptoms: no fever, no focal weakness, no visual change, no vomiting and no weakness   Patient reports he has a chronic daily headache for 4 weeks.  He reports he has it every day.  No head trauma.  No fevers or vomiting.  He has not been relieved with home medications  Past Medical History:  Diagnosis Date  . Asthma   . Chronic abdominal pain   . Hiatal hernia   . Hx of adenomatous colonic polyps 09/2011   Surveillance colonoscopy due 09/2016  . Medial meniscus tear    needs surgery, Dr. Aline Brochure to perform in near future.   . Stomach problems    related to NSAID use    Patient Active Problem List   Diagnosis Date Noted  . Chronic pain of right ankle 06/22/2018  . Asthma, moderate persistent 12/09/2014  . Gastroesophageal reflux disease without esophagitis 12/09/2014  . Diarrhea 10/15/2014  . Allergic rhinitis 11/30/2013  . Heartburn 06/22/2013  . RLQ abdominal pain 06/22/2013  . Personal history of colonic polyps 11/23/2012  . Cardiomegaly 11/23/2012  . S/P left knee arthroscopy 11/16/2011  . Rectal bleeding 10/08/2011  . Abdominal pain, epigastric 10/08/2011  . Torn meniscus 09/02/2011  . Right ankle sprain 05/26/2011  . MEDIAL MENISCUS TEAR, ACUTE 10/30/2010  . ACL TEAR, LEFT KNEE 10/30/2010  . RHEUMATOID ARTHRITIS 11/05/2009  . KNEE SPRAIN, RIGHT 11/05/2009    Past Surgical History:  Procedure Laterality Date  . ACL graft and medial meniscus repair  12/12/10    left, needs repeat surgery for medial meniscus tear in near future, graft intact  . COLONOSCOPY  09/2011   [id number] rectal polyp-tubular adenoma. Normal TI. Next TCS in 09/2016.  Marland Kitchen COLONOSCOPY WITH PROPOFOL N/A 06/01/2019   Procedure: COLONOSCOPY WITH PROPOFOL;  Surgeon: Daneil Dolin, MD;  [facility];  Service: Endoscopy;  Laterality: N/A;  10:30-rescheduled to 10/8 @ 9:30am per office  . ESOPHAGOGASTRODUODENOSCOPY  09/2011   [id number] hiatal hernia otherwise negative exam.   . ESOPHAGOGASTRODUODENOSCOPY (EGD) WITH PROPOFOL N/A 06/01/2019   Procedure: ESOPHAGOGASTRODUODENOSCOPY (EGD) WITH PROPOFOL;  Surgeon: Daneil Dolin, MD;  Location: [facility];  Service: Endoscopy;  Laterality: N/A;  . FLEXIBLE SIGMOIDOSCOPY N/A 07/13/2013   RMR: Minimal rectal mucosal abnormality consistent more with enema tip trauma than anything else.  No significant findings consistent with inflammatory bowel disease or other abnormality, S/p rectal bx.The sigmoidoscopy revealed otherwise normal  . POLYPECTOMY  06/01/2019   Procedure: POLYPECTOMY;  Surgeon: Daneil Dolin, MD;  Location: [facility];  Service: Endoscopy;;  rectal        Home Medications    Prior to Admission medications   Medication Sig Start Date End Date Taking? Authorizing Provider  albuterol (PROVENTIL HFA;VENTOLIN HFA) 108 (90 Base) MCG/ACT inhaler Inhale 2 puffs into the lungs every 4 (four) hours as needed for wheezing or shortness of breath. 04/01/18   Mikey Kirschner, MD  albuterol (  PROVENTIL) (2.5 MG/3ML) 0.083% nebulizer solution Use via neb q 4 hrs prn wheezing; may alternate with inhaler 04/01/18   Mikey Kirschner, MD  benzonatate (TESSALON) 100 MG capsule Take 1 capsule (100 mg total) by mouth every 8 (eight) hours. Patient taking differently: Take 100 mg by mouth as needed.  03/24/19   Wurst, [place], PA-C  cetirizine (ZYRTEC) 10 MG tablet Take 1 tablet (10 mg total) by mouth daily. Patient taking differently:  Take 10 mg by mouth as needed.  04/01/18   Mikey Kirschner, MD  fluticasone (FLONASE) 50 MCG/ACT nasal spray Place 2 sprays into both nostrils daily. Patient taking differently: Place 2 sprays into both nostrils as needed.  10/31/18   Mikey Kirschner, MD  fluticasone (FLOVENT HFA) 110 MCG/ACT inhaler Inhale 2 puffs into the lungs 2 (two) times daily. Patient taking differently: Inhale 2 puffs into the lungs as needed.  04/01/18   Mikey Kirschner, MD    Family History Family History  Problem Relation Age of Onset  . Diabetes Other   . Colon cancer Neg Hx     Social History Social History   Tobacco Use  . Smoking status: Never Smoker  . Smokeless tobacco: Never Used  Substance Use Topics  . Alcohol use: No  . Drug use: No     Allergies   Patient has no known allergies.   Review of Systems Review of Systems  Constitutional: Negative for fever.  Eyes: Negative for visual disturbance.  Cardiovascular: Negative for chest pain.  Gastrointestinal: Negative for vomiting.  Neurological: Positive for headaches. Negative for focal weakness and weakness.  All other systems reviewed and are negative.    Physical Exam Updated Vital Signs BP 121/75   Pulse 89   Temp 98.8 F (37.1 C) (Oral)   Resp 15   Ht 1.727 m (5\' 8" )   Wt 115.7 kg   SpO2 98%   BMI 38.77 kg/m   Physical Exam CONSTITUTIONAL: Well developed/well nourished HEAD: Normocephalic/atraumatic, no signs of head trauma EYES: EOMI/PERRL, no nystagmus, no ptosis ENMT: Mucous membranes moist NECK: supple no meningeal signs, no bruits SPINE/BACK:entire spine nontender CV: S1/S2 noted, no murmurs/rubs/gallops noted LUNGS: Lungs are clear to auscultation bilaterally, no apparent distress ABDOMEN: soft, nontender, no rebound or guarding GU:no cva tenderness NEURO:Awake/alert, face symmetric, no arm or leg drift is noted Equal 5/5 strength with shoulder abduction, elbow flex/extension, wrist flex/extension in upper  extremities and equal hand grips bilaterally Equal 5/5 strength with hip flexion,knee flex/extension, foot dorsi/plantar flexion Cranial nerves 3/4/5/6/03/01/09/11/12 tested and intact Gait normal without ataxia No past pointing Sensation to light touch intact in all extremities EXTREMITIES: pulses normal, full ROM SKIN: warm, color normal PSYCH: no abnormalities of mood noted, alert and oriented to situation    ED Treatments / Results  Labs (all labs ordered are listed, but only abnormal results are displayed) Labs Reviewed - No data to display  EKG None  Radiology Ct Head Wo Contrast  Result Date: 07/20/2019 CLINICAL DATA:  Four weeks of headache, taking over-the-counter medications without relief, no injury or trauma EXAM: CT HEAD WITHOUT CONTRAST TECHNIQUE: Contiguous axial images were obtained from the base of the skull through the vertex without intravenous contrast. COMPARISON:  CT head 08/29/2012 FINDINGS: Brain: No evidence of acute infarction, hemorrhage, hydrocephalus, extra-axial collection or mass lesion/mass effect. Vascular: No hyperdense vessel or unexpected calcification. Skull: No calvarial fracture or suspicious osseous lesion. No scalp swelling or hematoma. Sinuses/Orbits: Mild nodular thickening in the maxillary  sinuses, possible retention cysts. Remaining sinuses and mastoid air cells are predominantly clear. Included orbital structures are unremarkable. Other: None IMPRESSION: 1. Unremarkable CT appearance of the brain. 2. Mild bilateral maxillary sinus disease, possible retention cysts. Electronically Signed   By: Lovena Le M.D.   On: 07/20/2019 05:09    Procedures Procedures    Medications Ordered in ED Medications  metoCLOPramide (REGLAN) injection 10 mg (10 mg Intravenous Given 07/20/19 0506)  diphenhydrAMINE (BENADRYL) injection 25 mg (25 mg Intravenous Given 07/20/19 0506)     Initial Impression / Assessment and Plan / ED Course  I have reviewed the  triage vital signs and the nursing notes.  Pertinent imaging results that were available during my care of the patient were reviewed by me and considered in my medical decision making (see chart for details).        CT imaging offered the patient due to persistent headache for 4 weeks.  No  acute intracranial findings, but does have sinusitis Patient well-appearing, no neuro deficit 5:54 AM Patient improved.  CT head shows no acute findings, but shows sinusitis.  Will treat as an outpatient for sinusitis. No acute neuro deficits.  Will refer to PCP if headache not improved in 2 weeks. Final Clinical Impressions(s) / ED Diagnoses   Final diagnoses:  Other headache syndrome  Acute non-recurrent maxillary sinusitis    ED Discharge Orders         Ordered    azithromycin (ZITHROMAX) 250 MG tablet  Daily     07/20/19 0536           Ripley Fraise, MD 07/20/19 [phone]

## 2019-07-20 NOTE — ED Triage Notes (Signed)
Pt states he has had a headache for about 4 weeks, taking otc meds without relief.  Pt denies injury or trauma.

## 2019-07-24 ENCOUNTER — Ambulatory Visit (INDEPENDENT_AMBULATORY_CARE_PROVIDER_SITE_OTHER): Payer: Self-pay | Admitting: Family Medicine

## 2019-07-24 ENCOUNTER — Encounter: Payer: Self-pay | Admitting: Family Medicine

## 2019-07-24 ENCOUNTER — Other Ambulatory Visit: Payer: Self-pay

## 2019-07-24 DIAGNOSIS — I10 Essential (primary) hypertension: Secondary | ICD-10-CM

## 2019-07-24 MED ORDER — CEFPROZIL 500 MG PO TABS: 500.0000 mg | ORAL_TABLET | Freq: Two times a day (BID) | ORAL | 0 refills | Status: AC

## 2019-07-24 MED ORDER — AMLODIPINE BESYLATE 5 MG PO TABS: 5.0000 mg | ORAL_TABLET | Freq: Every day | ORAL | 5 refills | Status: AC

## 2019-07-24 NOTE — Progress Notes (Signed)
   Subjective:    Patient ID: Margarette Asal, male    DOB: 25-Jun-1991, 28 y.o.   MRN: [id number]  Sinus Problem This is a new problem. The current episode started in the past 7 days. Associated symptoms include congestion, headaches and sinus pressure.   Went to Er 07/20/19 and was given a z pack but it has not helped- was also told his blood pressure was running high   Review of Systems  HENT: Positive for congestion and sinus pressure.   Neurological: Positive for headaches.   Virtual Visit via Video Note  I connected with Margarette Asal on 07/24/19 at  1:10 PM EST by a video enabled telemedicine application and verified that I am speaking with the correct person using two identifiers.  Location: Patient: home Provider: office   I discussed the limitations of evaluation and management by telemedicine and the availability of in person appointments. The patient expressed understanding and agreed to proceed.  History of Present Illness:    Observations/Objective:   Assessment and Plan:   Follow Up Instructions:    I discussed the assessment and treatment plan with the patient. The patient was provided an opportunity to ask questions and all were answered. The patient agreed with the plan and demonstrated an understanding of the instructions.   The patient was advised to call back or seek an in-person evaluation if the symptoms worsen or if the condition fails to improve as anticipated.  I provide 18 minutes of non-face-to-face time during this encounter.  Full emergency room report reviewed  Patient presented with 4-week history of headache.  Review of charts over the past 6 months shows frequently blood pressure elevated.  Strong family history of hypertension.  Patient scan revealed --[place]. sinusitis chronic also was told his blood pressure was elevated and needed to follow-up with [place]     Objective:   Physical Exam   Virtual     Assessment & Plan:   Impression 1 persistent rhinosinusitis will cover with additional antibiotic  2.  Substantial elevated blood pressure with headaches and strong family history of high blood pressure.  Numbers substantially elevated off and on over the past half year.  With aggravating symptoms we will go ahead and treat rationale discussed.

## 2019-07-24 NOTE — Addendum Note (Signed)
Addended by: Dairl Ponder on: 07/24/2019 02:06 PM   Modules accepted: Orders

## 2019-08-03 ENCOUNTER — Other Ambulatory Visit (HOSPITAL_COMMUNITY): Payer: BC Managed Care – PPO

## 2019-08-04 ENCOUNTER — Other Ambulatory Visit (HOSPITAL_COMMUNITY): Payer: BC Managed Care – PPO

## 2019-09-01 ENCOUNTER — Encounter: Payer: Self-pay | Admitting: Internal Medicine

## 2019-09-01 ENCOUNTER — Telehealth: Payer: Self-pay | Admitting: Internal Medicine

## 2019-09-01 ENCOUNTER — Ambulatory Visit: Payer: BC Managed Care – PPO | Admitting: Gastroenterology

## 2019-09-01 NOTE — Telephone Encounter (Signed)
Patient was a no show and letter sent  °

## 2019-09-25 ENCOUNTER — Encounter: Payer: Self-pay | Admitting: Family Medicine

## 2020-07-16 ENCOUNTER — Other Ambulatory Visit: Payer: Self-pay

## 2020-07-16 ENCOUNTER — Encounter (HOSPITAL_COMMUNITY): Payer: Self-pay | Admitting: *Deleted

## 2020-07-16 ENCOUNTER — Emergency Department (HOSPITAL_COMMUNITY)
Admission: EM | Admit: 2020-07-16 | Discharge: 2020-07-16 | Disposition: A | Discharge status: Home / Self Care | Payer: Self-pay | Attending: Emergency Medicine | Admitting: Emergency Medicine

## 2020-07-16 ENCOUNTER — Emergency Department (HOSPITAL_COMMUNITY): Payer: Self-pay

## 2020-07-16 DIAGNOSIS — X500XXA Overexertion from strenuous movement or load, initial encounter: Secondary | ICD-10-CM | POA: Insufficient documentation

## 2020-07-16 DIAGNOSIS — Z7952 Long term (current) use of systemic steroids: Secondary | ICD-10-CM | POA: Insufficient documentation

## 2020-07-16 DIAGNOSIS — S93492A Sprain of other ligament of left ankle, initial encounter: Secondary | ICD-10-CM

## 2020-07-16 DIAGNOSIS — Z79899 Other long term (current) drug therapy: Secondary | ICD-10-CM | POA: Insufficient documentation

## 2020-07-16 DIAGNOSIS — Y9367 Activity, basketball: Secondary | ICD-10-CM | POA: Insufficient documentation

## 2020-07-16 DIAGNOSIS — S93432A Sprain of tibiofibular ligament of left ankle, initial encounter: Secondary | ICD-10-CM | POA: Insufficient documentation

## 2020-07-16 DIAGNOSIS — I10 Essential (primary) hypertension: Secondary | ICD-10-CM | POA: Insufficient documentation

## 2020-07-16 DIAGNOSIS — J4540 Moderate persistent asthma, uncomplicated: Secondary | ICD-10-CM | POA: Insufficient documentation

## 2020-07-16 NOTE — ED Notes (Signed)
X Ray at bedside at this time.  

## 2020-07-16 NOTE — ED Triage Notes (Signed)
Pt with left ankle after twisting and hearing a pop from playing basketball earlier today.  Unable to put weight on it.

## 2020-07-16 NOTE — Discharge Instructions (Signed)
Take 4 over the counter ibuprofen tablets 3 times a day or 2 over-the-counter naproxen tablets twice a day for pain. Also take tylenol 1000mg(2 extra strength) four times a day.    

## 2020-07-16 NOTE — ED Provider Notes (Signed)
[facility] EMERGENCY DEPARTMENT Provider Note   CSN: [id number] Arrival date & time: 07/16/20  1950     History Chief Complaint  Patient presents with  . Ankle Pain    JAKIM DRAPEAU is a 29 y.o. male.  29 yo M with a cc of left ankle pain.  The patient was playing basketball and he jumped in the air to get a rebound and landed on someone's foot.  Had inversion of the ankle.  No pain to the knee.  Denies other area of injury.  Has had pain and swelling.  Occurred a few hours ago.  The history is provided by the patient.  Ankle Pain Location:  Ankle Time since incident:  2 days Injury: no   Ankle location:  L ankle Pain details:    Quality:  Aching   Radiates to:  Does not radiate   Severity:  Moderate   Onset quality:  Gradual   Duration:  2 days   Timing:  Constant   Progression:  Worsening Chronicity:  New Dislocation: no   Prior injury to area:  No Relieved by:  Nothing Worsened by:  Nothing Ineffective treatments:  None tried Associated symptoms: no fever        Past Medical History:  Diagnosis Date  . Asthma   . Chronic abdominal pain   . Hiatal hernia   . Hx of adenomatous colonic polyps 09/2011   Surveillance colonoscopy due 09/2016  . Medial meniscus tear    needs surgery, Dr. Aline Brochure to perform in near future.   . Stomach problems    related to NSAID use    Patient Active Problem List   Diagnosis Date Noted  . Essential hypertension 07/24/2019  . Chronic pain of right ankle 06/22/2018  . Asthma, moderate persistent 12/09/2014  . Gastroesophageal reflux disease without esophagitis 12/09/2014  . Diarrhea 10/15/2014  . Allergic rhinitis 11/30/2013  . Heartburn 06/22/2013  . RLQ abdominal pain 06/22/2013  . Personal history of colonic polyps 11/23/2012  . Cardiomegaly 11/23/2012  . S/P left knee arthroscopy 11/16/2011  . Rectal bleeding 10/08/2011  . Abdominal pain, epigastric 10/08/2011  . Torn meniscus 09/02/2011  . Right ankle sprain  05/26/2011  . MEDIAL MENISCUS TEAR, ACUTE 10/30/2010  . ACL TEAR, LEFT KNEE 10/30/2010  . RHEUMATOID ARTHRITIS 11/05/2009  . KNEE SPRAIN, RIGHT 11/05/2009    Past Surgical History:  Procedure Laterality Date  . ACL graft and medial meniscus repair  12/12/10   left, needs repeat surgery for medial meniscus tear in near future, graft intact  . COLONOSCOPY  09/2011   [id number] rectal polyp-tubular adenoma. Normal TI. Next TCS in 09/2016.  Marland Kitchen COLONOSCOPY WITH PROPOFOL N/A 06/01/2019   Procedure: COLONOSCOPY WITH PROPOFOL;  Surgeon: Daneil Dolin, MD;  [facility]: [facility];  Service: Endoscopy;  Laterality: N/A;  10:30-rescheduled to 10/8 @ 9:30am per office  . ESOPHAGOGASTRODUODENOSCOPY  09/2011   [id number] hiatal hernia otherwise negative exam.   . ESOPHAGOGASTRODUODENOSCOPY (EGD) WITH PROPOFOL N/A 06/01/2019   Procedure: ESOPHAGOGASTRODUODENOSCOPY (EGD) WITH PROPOFOL;  Surgeon: Daneil Dolin, MD;  Location: [facility];  Service: Endoscopy;  Laterality: N/A;  . FLEXIBLE SIGMOIDOSCOPY N/A 07/13/2013   RMR: Minimal rectal mucosal abnormality consistent more with enema tip trauma than anything else.  No significant findings consistent with inflammatory bowel disease or other abnormality, S/p rectal bx.The sigmoidoscopy revealed otherwise normal  . POLYPECTOMY  06/01/2019   Procedure: POLYPECTOMY;  Surgeon: Daneil Dolin, MD;  Location: [facility];  Service: Endoscopy;;  rectal       Family History  Problem Relation Age of Onset  . Diabetes Other   . Colon cancer Neg Hx     Social History   Tobacco Use  . Smoking status: Never Smoker  . Smokeless tobacco: Never Used  Vaping Use  . Vaping Use: Never used  Substance Use Topics  . Alcohol use: No  . Drug use: No    Home Medications Prior to Admission medications   Medication Sig Start Date End Date Taking? Authorizing Provider  albuterol (PROVENTIL HFA;VENTOLIN HFA) 108 (90 Base) MCG/ACT inhaler Inhale 2 puffs into the  lungs every 4 (four) hours as needed for wheezing or shortness of breath. 04/01/18   Mikey Kirschner, MD  albuterol (PROVENTIL) (2.5 MG/3ML) 0.083% nebulizer solution Use via neb q 4 hrs prn wheezing; may alternate with inhaler 04/01/18   Mikey Kirschner, MD  amLODipine (NORVASC) 5 MG tablet Take 1 tablet (5 mg total) by mouth at bedtime. 07/24/19   Mikey Kirschner, MD  azithromycin (ZITHROMAX) 250 MG tablet Take 1 tablet (250 mg total) by mouth daily. Take first 2 tablets together, then 1 every day until finished. 07/20/19   Ripley Fraise, MD  cefPROZIL (CEFZIL) 500 MG tablet Take 1 tablet (500 mg total) by mouth 2 (two) times daily. 07/24/19   Mikey Kirschner, MD  cetirizine (ZYRTEC) 10 MG tablet Take 1 tablet (10 mg total) by mouth daily. Patient taking differently: Take 10 mg by mouth as needed.  04/01/18 07/20/19  Mikey Kirschner, MD  fluticasone (FLONASE) 50 MCG/ACT nasal spray Place 2 sprays into both nostrils daily. Patient taking differently: Place 2 sprays into both nostrils as needed.  10/31/18 07/20/19  Mikey Kirschner, MD  fluticasone (FLOVENT HFA) 110 MCG/ACT inhaler Inhale 2 puffs into the lungs 2 (two) times daily. Patient taking differently: Inhale 2 puffs into the lungs as needed.  04/01/18 07/20/19  Mikey Kirschner, MD    Allergies    Patient has no known allergies.  Review of Systems   Review of Systems  Constitutional: Negative for chills and fever.  HENT: Negative for congestion and facial swelling.   Eyes: Negative for discharge and visual disturbance.  Respiratory: Negative for shortness of breath.   Cardiovascular: Negative for chest pain and palpitations.  Gastrointestinal: Negative for abdominal pain, diarrhea and vomiting.  Musculoskeletal: Positive for arthralgias and myalgias.  Skin: Negative for color change and rash.  Neurological: Negative for tremors, syncope and headaches.  Psychiatric/Behavioral: Negative for confusion and dysphoric mood.     Physical Exam Updated Vital Signs BP (!) 151/81 (BP Location: Right Arm)   Pulse (!) 105   Temp 99.5 F (37.5 C) (Oral)   Resp 16   Ht 5\' 8"  (1.727 m)   Wt 108.9 kg   SpO2 99%   BMI 36.49 kg/m   Physical Exam Vitals and nursing note reviewed.  Constitutional:      Appearance: He is well-developed.  HENT:     Head: Normocephalic and atraumatic.  Eyes:     Pupils: Pupils are equal, round, and reactive to light.  Neck:     Vascular: No JVD.  Cardiovascular:     Rate and Rhythm: Normal rate and regular rhythm.     Heart sounds: No murmur heard.  No friction rub. No gallop.   Pulmonary:     Effort: No respiratory distress.     Breath sounds: No wheezing.  Abdominal:  General: There is no distension.     Tenderness: There is no guarding or rebound.  Musculoskeletal:        General: Swelling and tenderness present. Normal range of motion.     Cervical back: Normal range of motion and neck supple.     Comments: Pain and swelling to the left ankle worst about the posterior aspect.  Achilles is intact.  Pulse motor and sensation are intact distally.  Skin:    Coloration: Skin is not pale.     Findings: No rash.  Neurological:     Mental Status: He is alert and oriented to person, place, and time.  Psychiatric:        Behavior: Behavior normal.     ED Results / Procedures / Treatments   Labs (all labs ordered are listed, but only abnormal results are displayed) Labs Reviewed - No data to display  EKG None  Radiology DG Ankle Complete Left  Result Date: 07/16/2020 CLINICAL DATA:  Left ankle pain and swelling. Inability to bear weight. Twist after playing basketball. EXAM: LEFT ANKLE COMPLETE - 3+ VIEW COMPARISON:  X-ray left ankle 03/20/2010. FINDINGS: There is no evidence of fracture or dislocation. Small left ankle effusion. Well corticated density along the lateral talus appears similar compared to prior. There is no evidence of arthropathy or other focal  bone abnormality. Lateral subcutaneus soft tissue edema of the ankle. IMPRESSION: No acute displaced fracture or dislocation of the bones of the left ankle in a patient with a small ankle effusion and subcutaneus soft tissue edema. Electronically Signed   By: Iven Finn M.D.   On: 07/16/2020 21:27    Procedures Procedures (including critical care time)  Medications Ordered in ED Medications - No data to display  ED Course  I have reviewed the triage vital signs and the nursing notes.  Pertinent labs & imaging results that were available during my care of the patient were reviewed by me and considered in my medical decision making (see chart for details).    MDM Rules/Calculators/A&P                          29 yo M with a chief complaints of left ankle pain.  Had an inversion injury.  Plain film viewed by me without fracture.  Will place in a splint.  Crutches.  PCP follow-up.  10:54 PM:  I have discussed the diagnosis/risks/treatment options with the patient and believe the pt to be eligible for discharge home to follow-up with PCP. We also discussed returning to the ED immediately if new or worsening sx occur. We discussed the sx which are most concerning (e.g., sudden worsening pain, fever, inability to tolerate by mouth) that necessitate immediate return. Medications administered to the patient during their visit and any new prescriptions provided to the patient are listed below.  Medications given during this visit Medications - No data to display   The patient appears reasonably screen and/or stabilized for discharge and I doubt any other medical condition or other [facility] requiring further screening, evaluation, or treatment in the ED at this time prior to discharge.   Final Clinical Impression(s) / ED Diagnoses Final diagnoses:  Sprain of posterior talofibular ligament of left ankle, initial encounter    Rx / DC Orders ED Discharge Orders    None       Deno Etienne,  DO 07/16/20 2254

## 2023-07-07 ENCOUNTER — Other Ambulatory Visit: Payer: Self-pay

## 2023-07-07 ENCOUNTER — Encounter (HOSPITAL_COMMUNITY): Payer: Self-pay

## 2023-07-07 ENCOUNTER — Emergency Department (HOSPITAL_COMMUNITY)
Admission: EM | Admit: 2023-07-07 | Discharge: 2023-07-07 | Disposition: A | Discharge status: Home / Self Care | Payer: BC Managed Care – PPO | Attending: Emergency Medicine | Admitting: Emergency Medicine

## 2023-07-07 ENCOUNTER — Emergency Department (HOSPITAL_COMMUNITY): Payer: BC Managed Care – PPO

## 2023-07-07 DIAGNOSIS — Y9367 Activity, basketball: Secondary | ICD-10-CM | POA: Diagnosis not present

## 2023-07-07 DIAGNOSIS — S63615A Unspecified sprain of left ring finger, initial encounter: Secondary | ICD-10-CM | POA: Diagnosis not present

## 2023-07-07 DIAGNOSIS — X58XXXA Exposure to other specified factors, initial encounter: Secondary | ICD-10-CM | POA: Diagnosis not present

## 2023-07-07 DIAGNOSIS — M79645 Pain in left finger(s): Secondary | ICD-10-CM | POA: Diagnosis present

## 2023-07-07 MED ORDER — OXYCODONE-ACETAMINOPHEN 5-325 MG PO TABS
1.0000 | ORAL_TABLET | Freq: Once | ORAL | Status: AC
  Administered 2023-07-07: 1 via ORAL
  Filled 2023-07-07: qty 1

## 2023-07-07 NOTE — Discharge Instructions (Signed)
You were seen in the ER today for concerns of finger pain. Your xray was negative for any fractures or dislocations. You appear to have sustained a sprain of the left index finger. I would encourage you to follow up with your primary care provider. Continue to take Tylenol as needed for pain.

## 2023-07-07 NOTE — ED Notes (Signed)
Portable Xray at bedside.

## 2023-07-07 NOTE — ED Provider Notes (Signed)
[place] EMERGENCY DEPARTMENT AT [facility] Provider Note   CSN: [id number] Arrival date & time: 07/07/23  2019     History Chief Complaint  Patient presents with   Finger Injury    Frank Hansen is a 32 y.o. male.  Patient with past history significant for rheumatoid arthritis presents to the emergency department with concerns of finger pain.  He reports that he was playing basketball about a week ago and injured his left ring finger.  States that the area has remained swollen and painful.  Has some difficulty with flexion and extension of that finger due to pain.  Denies any recent history of prior history of IV drug use.  No recent fever, chills or bodyaches.  HPI     Home Medications Prior to Admission medications   Medication Sig Start Date End Date Taking? Authorizing Provider  albuterol (PROVENTIL HFA;VENTOLIN HFA) 108 (90 Base) MCG/ACT inhaler Inhale 2 puffs into the lungs every 4 (four) hours as needed for wheezing or shortness of breath. 04/01/18   Merlyn Albert, MD  albuterol (PROVENTIL) (2.5 MG/3ML) 0.083% nebulizer solution Use via neb q 4 hrs prn wheezing; may alternate with inhaler 04/01/18   Merlyn Albert, MD  amLODipine (NORVASC) 5 MG tablet Take 1 tablet (5 mg total) by mouth at bedtime. 07/24/19   Merlyn Albert, MD  azithromycin (ZITHROMAX) 250 MG tablet Take 1 tablet (250 mg total) by mouth daily. Take first 2 tablets together, then 1 every day until finished. 07/20/19   Zadie Rhine, MD  cefPROZIL (CEFZIL) 500 MG tablet Take 1 tablet (500 mg total) by mouth 2 (two) times daily. 07/24/19   Merlyn Albert, MD  cetirizine (ZYRTEC) 10 MG tablet Take 1 tablet (10 mg total) by mouth daily. Patient taking differently: Take 10 mg by mouth as needed.  04/01/18 07/20/19  Merlyn Albert, MD  fluticasone (FLONASE) 50 MCG/ACT nasal spray Place 2 sprays into both nostrils daily. Patient taking differently: Place 2 sprays into both nostrils as  needed.  10/31/18 07/20/19  Merlyn Albert, MD  fluticasone (FLOVENT HFA) 110 MCG/ACT inhaler Inhale 2 puffs into the lungs 2 (two) times daily. Patient taking differently: Inhale 2 puffs into the lungs as needed.  04/01/18 07/20/19  Merlyn Albert, MD      Allergies    Patient has no known allergies.    Review of Systems   Review of Systems  Musculoskeletal:  Positive for joint swelling.  All other systems reviewed and are negative.   Physical Exam Updated Vital Signs BP (!) 171/107 (BP Location: Right Arm)   Pulse 65   Temp 99 F (37.2 C) (Oral)   Resp 16   Ht 5\' 6"  (1.676 m)   Wt 101.6 kg   SpO2 98%   BMI 36.15 kg/m  Physical Exam Vitals and nursing note reviewed.  Constitutional:      General: He is not in acute distress.    Appearance: He is well-developed.  HENT:     Head: Normocephalic and atraumatic.  Eyes:     Conjunctiva/sclera: Conjunctivae normal.  Cardiovascular:     Rate and Rhythm: Normal rate and regular rhythm.     Heart sounds: No murmur heard. Pulmonary:     Effort: Pulmonary effort is normal. No respiratory distress.     Breath sounds: Normal breath sounds.  Abdominal:     Palpations: Abdomen is soft.     Tenderness: There is no abdominal tenderness.  Musculoskeletal:        General: Swelling and tenderness present. No deformity.       Arms:     Cervical back: Neck supple.     Comments: Swelling overlying the ring finger of the left hand.  Difficulty with flexion and extension of this finger due to pain.  No obvious visible deformity beyond the swelling.  No bruising seen either.  Skin:    General: Skin is warm and dry.     Capillary Refill: Capillary refill takes less than 2 seconds.  Neurological:     Mental Status: He is alert.  Psychiatric:        Mood and Affect: Mood normal.     ED Results / Procedures / Treatments   Labs (all labs ordered are listed, but only abnormal results are displayed) Labs Reviewed - No data to  display  EKG None  Radiology DG Hand Complete Left  Result Date: 07/07/2023 CLINICAL DATA:  4th digit pain and swelling EXAM: LEFT HAND - COMPLETE 3+ VIEW COMPARISON:  None Available. FINDINGS: There is no evidence of fracture or dislocation. There is no evidence of arthropathy or other focal bone abnormality. Soft tissues are unremarkable. IMPRESSION: Negative. Electronically Signed   By: Tish Frederickson M.D.   On: 07/07/2023 22:14    Procedures Procedures   Medications Ordered in ED Medications  oxyCODONE-acetaminophen (PERCOCET/ROXICET) 5-325 MG per tablet 1 tablet (1 tablet Oral Given 07/07/23 2159)    ED Course/ Medical Decision Making/ A&P                               Medical Decision Making Amount and/or Complexity of Data Reviewed Radiology: ordered.   This patient presents to the ED for concern of finger injury.  Differential diagnosis includes finger sprain, finger dislocation, finger fracture   Imaging Studies ordered:  I ordered imaging studies including x-ray left hand I independently visualized and interpreted imaging which showed negative for any acute fractures or dislocations I agree with the radiologist interpretation   Medicines ordered and prescription drug management:  I ordered medication including Percocet for pain Reevaluation of the patient after these medicines showed that the patient improved I have reviewed the patients home medicines and have made adjustments as needed   Problem List / ED Course:  Patient presents to the emergency department with concerns of a finger injury.  Reportedly was playing basketball about 1 week ago and is now had persistent swelling and pain in the left ring finger.  Some difficulty with extension and flexion of this finger due to pain.  Has not had any evaluation with x-ray imaging of this area.  No significant bruising or erythema over the area.  Patient denies history of IV drug use. X-ray imaging obtained  which was thankfully reassuring without any evidence of any fracture or dislocation.  Percocet helpful for pain.  Advised patient of reassuring findings encourage patient to continue use of finger splint for additional comfort and support.  Advised patient to follow-up with primary care provider for repeat evaluation and possibly with orthopedics if symptoms fail to improve.  Given that there is no erythema in this area, doubt cellulitis.  Advised patient to return to the emergency department if she has any acute worsening or decline in symptoms.  Patient discharged home in stable condition.  Final Clinical Impression(s) / ED Diagnoses Final diagnoses:  Sprain of left ring finger, unspecified site of digit,  initial encounter    Rx / DC Orders ED Discharge Orders     None         Salomon Mast 07/07/23 2334    Rondel Baton, MD 07/10/23 1105

## 2023-07-07 NOTE — ED Triage Notes (Signed)
Pt reports he jammed his left ring finger playing ball a week ago and it is still swollen and sore.

## 2023-11-05 ENCOUNTER — Ambulatory Visit: Payer: Self-pay | Admitting: Family Medicine

## 2023-11-09 ENCOUNTER — Encounter: Payer: Self-pay | Admitting: Family Medicine

## 2024-03-14 ENCOUNTER — Ambulatory Visit: Admitting: Family Medicine

## 2024-03-17 ENCOUNTER — Emergency Department (HOSPITAL_BASED_OUTPATIENT_CLINIC_OR_DEPARTMENT_OTHER)
Admission: EM | Admit: 2024-03-17 | Discharge: 2024-03-18 | Disposition: A | Discharge status: Home / Self Care | Attending: Emergency Medicine | Admitting: Emergency Medicine

## 2024-03-17 DIAGNOSIS — K625 Hemorrhage of anus and rectum: Secondary | ICD-10-CM | POA: Diagnosis present

## 2024-03-18 ENCOUNTER — Emergency Department (HOSPITAL_BASED_OUTPATIENT_CLINIC_OR_DEPARTMENT_OTHER)

## 2024-03-18 ENCOUNTER — Other Ambulatory Visit: Payer: Self-pay

## 2024-03-18 ENCOUNTER — Encounter (HOSPITAL_BASED_OUTPATIENT_CLINIC_OR_DEPARTMENT_OTHER): Payer: Self-pay

## 2024-03-18 LAB — COMPREHENSIVE METABOLIC PANEL WITH GFR
ALT: 20 U/L (ref 0–44)
AST: 23 U/L (ref 15–41)
Albumin: 4.4 g/dL (ref 3.5–5.0)
Alkaline Phosphatase: 94 U/L (ref 38–126)
Anion gap: 13 (ref 5–15)
BUN: 9 mg/dL (ref 6–20)
CO2: 23 mmol/L (ref 22–32)
Calcium: 9.6 mg/dL (ref 8.9–10.3)
Chloride: 104 mmol/L (ref 98–111)
Creatinine, Ser: 1.2 mg/dL (ref 0.61–1.24)
GFR, Estimated: >60 mL/min (ref >60)
Glucose, Bld: 116 mg/dL — ABNORMAL HIGH (ref 70–99)
Potassium: 3.9 mmol/L (ref 3.5–5.1)
Sodium: 140 mmol/L (ref 135–145)
Total Bilirubin: 0.4 mg/dL (ref 0.0–1.2)
Total Protein: 7.8 g/dL (ref 6.5–8.1)

## 2024-03-18 LAB — CBC
HCT: 41.6 % (ref 39.0–52.0)
Hemoglobin: 14.2 g/dL (ref 13.0–17.0)
MCH: 32.3 pg (ref 26.0–34.0)
MCHC: 34.1 g/dL (ref 30.0–36.0)
MCV: 94.5 fL (ref 80.0–100.0)
Platelets: 306 K/uL (ref 150–400)
RBC: 4.4 MIL/uL (ref 4.22–5.81)
RDW: 12.9 % (ref 11.5–15.5)
WBC: 8 K/uL (ref 4.0–10.5)
nRBC: 0 % (ref 0.0–0.2)

## 2024-03-18 MED ORDER — CIPROFLOXACIN HCL 500 MG PO TABS
500.0000 mg | ORAL_TABLET | Freq: Once | ORAL | Status: AC
  Administered 2024-03-18: 500 mg via ORAL
  Filled 2024-03-18: qty 1

## 2024-03-18 MED ORDER — IOHEXOL 300 MG/ML  SOLN
100.0000 mL | Freq: Once | INTRAMUSCULAR | Status: AC | PRN
  Administered 2024-03-18: 100 mL via INTRAVENOUS

## 2024-03-18 MED ORDER — CIPROFLOXACIN HCL 500 MG PO TABS: 500.0000 mg | ORAL_TABLET | Freq: Two times a day (BID) | ORAL | 0 refills | Status: AC

## 2024-03-18 NOTE — ED Provider Notes (Signed)
 [place] EMERGENCY DEPARTMENT AT [facility] Provider Note   CSN: [id number] Arrival date & time: 03/17/24  2352     Patient presents with: Rectal Bleeding   Frank Hansen is a 33 y.o. male.   64 male with history of colonic polyps status post excision x 2 who presents the ER today with rectal bleeding.  Patient states he does not currently have a GI doctor.  States he has had about a month worth of rectal blood.  States initially was just on toilet paper but more recently he is seeing clots and blood in the toilet as well.  No lightheadedness, dizziness, weakness or other associated symptoms.  Has some diffuse abdominal discomfort but not severe Vere.  No nausea or vomiting.   Rectal Bleeding      Prior to Admission medications   Medication Sig Start Date End Date Taking? Authorizing Provider  ciprofloxacin  (CIPRO ) 500 MG tablet Take 1 tablet (500 mg total) by mouth 2 (two) times daily. 03/18/24  Yes Daziah Hesler, Selinda, MD  albuterol  (PROVENTIL  HFA;VENTOLIN  HFA) 108 (90 Base) MCG/ACT inhaler Inhale 2 puffs into the lungs every 4 (four) hours as needed for wheezing or shortness of breath. 04/01/18   Alphonsa Elsie RAMAN, MD  albuterol  (PROVENTIL ) (2.5 MG/3ML) 0.083% nebulizer solution Use via neb q 4 hrs prn wheezing; may alternate with inhaler 04/01/18   Alphonsa Elsie RAMAN, MD  amLODipine  (NORVASC ) 5 MG tablet Take 1 tablet (5 mg total) by mouth at bedtime. 07/24/19   Alphonsa Elsie RAMAN, MD  azithromycin  (ZITHROMAX ) 250 MG tablet Take 1 tablet (250 mg total) by mouth daily. Take first 2 tablets together, then 1 every day until finished. 07/20/19   Midge Golas, MD  cefPROZIL  (CEFZIL ) 500 MG tablet Take 1 tablet (500 mg total) by mouth 2 (two) times daily. 07/24/19   Alphonsa Elsie RAMAN, MD  cetirizine  (ZYRTEC ) 10 MG tablet Take 1 tablet (10 mg total) by mouth daily. Patient taking differently: Take 10 mg by mouth as needed.  04/01/18 07/20/19  Alphonsa Elsie RAMAN, MD  fluticasone  (FLONASE )  50 MCG/ACT nasal spray Place 2 sprays into both nostrils daily. Patient taking differently: Place 2 sprays into both nostrils as needed.  10/31/18 07/20/19  Alphonsa Elsie RAMAN, MD  fluticasone  (FLOVENT  HFA) 110 MCG/ACT inhaler Inhale 2 puffs into the lungs 2 (two) times daily. Patient taking differently: Inhale 2 puffs into the lungs as needed.  04/01/18 07/20/19  Alphonsa Elsie RAMAN, MD    Allergies: Patient has no known allergies.    Review of Systems  Gastrointestinal:  Positive for hematochezia.    Updated Vital Signs BP 115/80   Pulse 65   Temp 98.3 F (36.8 C)   Resp 18   Ht 5' 8 (1.727 m)   Wt 101.6 kg   SpO2 98%   BMI 34.06 kg/m   Physical Exam Vitals and nursing note reviewed.  Constitutional:      Appearance: He is well-developed.  HENT:     Head: Normocephalic and atraumatic.  Cardiovascular:     Rate and Rhythm: Normal rate.  Pulmonary:     Effort: Pulmonary effort is normal. No respiratory distress.  Abdominal:     General: There is no distension.  Musculoskeletal:        General: Normal range of motion.     Cervical back: Normal range of motion.  Neurological:     Mental Status: He is alert.     (all labs ordered are listed, but only  abnormal results are displayed) Labs Reviewed  COMPREHENSIVE METABOLIC PANEL WITH GFR - Abnormal; Notable for the following components:      Result Value   Glucose, Bld 116 (*)    All other components within normal limits  CBC    EKG: None  Radiology: CT ABDOMEN PELVIS W CONTRAST Result Date: 03/18/2024 CLINICAL DATA:  Abdominal pain EXAM: CT ABDOMEN AND PELVIS WITH CONTRAST TECHNIQUE: Multidetector CT imaging of the abdomen and pelvis was performed using the standard protocol following bolus administration of intravenous contrast. RADIATION DOSE REDUCTION: This exam was performed according to the departmental dose-optimization program which includes automated exposure control, adjustment of the mA and/or kV according to  patient size and/or use of iterative reconstruction technique. CONTRAST:  OMNIPAQUE  IOHEXOL  300 MG/ML  SOLN COMPARISON:  06/22/2013 FINDINGS: Lower chest: No acute abnormality Hepatobiliary: No focal hepatic abnormality. Gallbladder unremarkable. Pancreas: No focal abnormality or ductal dilatation. Spleen: No focal abnormality.  Normal size. Adrenals/Urinary Tract: No adrenal abnormality. No focal renal abnormality. No stones or hydronephrosis. Urinary bladder is unremarkable. Stomach/Bowel: Normal appendix. Stomach, large and small bowel grossly unremarkable. Vascular/Lymphatic: No evidence of aneurysm or adenopathy. Reproductive: No visible focal abnormality. Other: No free fluid or free air. Musculoskeletal: No acute bony abnormality. IMPRESSION: No acute findings in the abdomen or pelvis. Electronically Signed   By: Franky Crease M.D.   On: 03/18/2024 01:10     Procedures   Medications Ordered in the ED  iohexol  (OMNIPAQUE ) 300 MG/ML solution 100 mL (100 mLs Intravenous Contrast Given 03/18/24 0106)  ciprofloxacin  (CIPRO ) tablet 500 mg (500 mg Oral Given 03/18/24 0253)                                    Medical Decision Making Amount and/or Complexity of Data Reviewed Labs: ordered. Radiology: ordered.  Risk Prescription drug management.   Workup reassuring.  Still could have polyps or diverticulosis or something that not seen on the CT scan.  No evidence of anemia or sepsis or any other indication for further workup or hospitalization.  Will start antibiotics in case this is a bacterial infection since he has been going on for quite a while.  Referral for GI placed for consideration of colonoscopy.     Final diagnoses:  Rectal bleeding    ED Discharge Orders          Ordered    ciprofloxacin  (CIPRO ) 500 MG tablet  2 times daily        03/18/24 0243    Ambulatory referral to Gastroenterology        03/18/24 0243               Henretter Piekarski, Selinda, MD 03/18/24 [phone]

## 2024-03-18 NOTE — ED Triage Notes (Signed)
 Pt reports worsening rectal bleeding that began a few weeks ago. Pt states at first it was just when they wiped but now is bright red with each BM with small clots. Denies GI hx. C/o dull abd pain rating 5/10. Denies N/V/D, dizziness.
